# Patient Record
Sex: Female | Born: 1935 | Race: White | Hispanic: No | Smoking: Current every day smoker
Health system: Southern US, Community
[De-identification: ages and names within clinical notes are randomized; demographics above are authoritative.]

## PROBLEM LIST (undated history)

## (undated) DIAGNOSIS — I5189 Other ill-defined heart diseases: Secondary | ICD-10-CM

## (undated) DIAGNOSIS — J189 Pneumonia, unspecified organism: Secondary | ICD-10-CM

## (undated) DIAGNOSIS — I739 Peripheral vascular disease, unspecified: Secondary | ICD-10-CM

## (undated) DIAGNOSIS — I509 Heart failure, unspecified: Secondary | ICD-10-CM

## (undated) DIAGNOSIS — E7800 Pure hypercholesterolemia, unspecified: Secondary | ICD-10-CM

## (undated) DIAGNOSIS — I2510 Atherosclerotic heart disease of native coronary artery without angina pectoris: Secondary | ICD-10-CM

## (undated) DIAGNOSIS — J449 Chronic obstructive pulmonary disease, unspecified: Secondary | ICD-10-CM

## (undated) DIAGNOSIS — R0600 Dyspnea, unspecified: Secondary | ICD-10-CM

## (undated) DIAGNOSIS — I219 Acute myocardial infarction, unspecified: Secondary | ICD-10-CM

## (undated) DIAGNOSIS — I10 Essential (primary) hypertension: Secondary | ICD-10-CM

## (undated) HISTORY — PX: APPENDECTOMY: SHX54

## (undated) HISTORY — PX: TONSILLECTOMY: SUR1361

## (undated) HISTORY — PX: CORONARY ARTERY BYPASS GRAFT: SHX141

## (undated) HISTORY — PX: CHOLECYSTECTOMY: SHX55

## (undated) HISTORY — PX: ABDOMINAL HYSTERECTOMY: SHX81

## (undated) HISTORY — PX: OTHER SURGICAL HISTORY: SHX169

---

## 2001-01-22 ENCOUNTER — Inpatient Hospital Stay (HOSPITAL_COMMUNITY): Admission: RE | Admit: 2001-01-22 | Discharge: 2001-01-27 | Payer: Self-pay | Admitting: Cardiology

## 2001-01-22 ENCOUNTER — Encounter: Payer: Self-pay | Admitting: Emergency Medicine

## 2001-01-30 ENCOUNTER — Emergency Department (HOSPITAL_COMMUNITY): Admission: EM | Admit: 2001-01-30 | Discharge: 2001-01-31 | Payer: Self-pay | Admitting: Emergency Medicine

## 2001-01-31 ENCOUNTER — Encounter: Payer: Self-pay | Admitting: Emergency Medicine

## 2010-08-29 ENCOUNTER — Emergency Department (HOSPITAL_BASED_OUTPATIENT_CLINIC_OR_DEPARTMENT_OTHER)
Admission: EM | Admit: 2010-08-29 | Discharge: 2010-08-29 | Disposition: A | Discharge status: Home / Self Care | Payer: Medicare Other | Attending: Emergency Medicine | Admitting: Emergency Medicine

## 2010-08-29 ENCOUNTER — Encounter: Payer: Self-pay | Admitting: *Deleted

## 2010-08-29 ENCOUNTER — Other Ambulatory Visit: Payer: Self-pay

## 2010-08-29 ENCOUNTER — Emergency Department (INDEPENDENT_AMBULATORY_CARE_PROVIDER_SITE_OTHER): Payer: Medicare Other

## 2010-08-29 DIAGNOSIS — E7800 Pure hypercholesterolemia, unspecified: Secondary | ICD-10-CM | POA: Insufficient documentation

## 2010-08-29 DIAGNOSIS — J40 Bronchitis, not specified as acute or chronic: Secondary | ICD-10-CM

## 2010-08-29 DIAGNOSIS — R509 Fever, unspecified: Secondary | ICD-10-CM | POA: Insufficient documentation

## 2010-08-29 DIAGNOSIS — Z79899 Other long term (current) drug therapy: Secondary | ICD-10-CM | POA: Insufficient documentation

## 2010-08-29 DIAGNOSIS — J3489 Other specified disorders of nose and nasal sinuses: Secondary | ICD-10-CM | POA: Insufficient documentation

## 2010-08-29 DIAGNOSIS — J4489 Other specified chronic obstructive pulmonary disease: Secondary | ICD-10-CM | POA: Insufficient documentation

## 2010-08-29 DIAGNOSIS — R0789 Other chest pain: Secondary | ICD-10-CM | POA: Insufficient documentation

## 2010-08-29 DIAGNOSIS — R0602 Shortness of breath: Secondary | ICD-10-CM

## 2010-08-29 DIAGNOSIS — R059 Cough, unspecified: Secondary | ICD-10-CM | POA: Insufficient documentation

## 2010-08-29 DIAGNOSIS — J449 Chronic obstructive pulmonary disease, unspecified: Secondary | ICD-10-CM | POA: Insufficient documentation

## 2010-08-29 DIAGNOSIS — R05 Cough: Secondary | ICD-10-CM | POA: Insufficient documentation

## 2010-08-29 DIAGNOSIS — I10 Essential (primary) hypertension: Secondary | ICD-10-CM | POA: Insufficient documentation

## 2010-08-29 DIAGNOSIS — Z951 Presence of aortocoronary bypass graft: Secondary | ICD-10-CM | POA: Insufficient documentation

## 2010-08-29 HISTORY — DX: Essential (primary) hypertension: I10

## 2010-08-29 HISTORY — DX: Pure hypercholesterolemia, unspecified: E78.00

## 2010-08-29 LAB — CBC
Hemoglobin: 14.4 g/dL (ref 12.0–15.0)
MCH: 32.7 pg (ref 26.0–34.0)
MCV: 97.7 fL (ref 78.0–100.0)
Platelets: 160 10*3/uL (ref 150–400)
RBC: 4.41 MIL/uL (ref 3.87–5.11)
WBC: 17.2 10*3/uL — ABNORMAL HIGH (ref 4.0–10.5)

## 2010-08-29 LAB — DIFFERENTIAL
Eosinophils Absolute: 0.1 10*3/uL (ref 0.0–0.7)
Lymphocytes Relative: 8 % — ABNORMAL LOW (ref 12–46)
Lymphs Abs: 1.4 10*3/uL (ref 0.7–4.0)
Monocytes Relative: 5 % (ref 3–12)
Neutrophils Relative %: 85 % — ABNORMAL HIGH (ref 43–77)

## 2010-08-29 LAB — BASIC METABOLIC PANEL
BUN: 22 mg/dL (ref 6–23)
CO2: 23 mEq/L (ref 19–32)
GFR calc non Af Amer: >60 mL/min (ref >60)
Glucose, Bld: 134 mg/dL — ABNORMAL HIGH (ref 70–99)
Potassium: 4.2 mEq/L (ref 3.5–5.1)
Sodium: 139 mEq/L (ref 135–145)

## 2010-08-29 LAB — CARDIAC PANEL(CRET KIN+CKTOT+MB+TROPI)
CK, MB: 2 ng/mL (ref 0.3–4.0)
Relative Index: INVALID (ref 0.0–2.5)
Troponin I: <0.3 ng/mL (ref <0.30)

## 2010-08-29 MED ORDER — SODIUM CHLORIDE 0.9 % IV SOLN
Freq: Once | INTRAVENOUS | Status: AC
  Administered 2010-08-29: 12:00:00 via INTRAVENOUS

## 2010-08-29 MED ORDER — ALBUTEROL SULFATE HFA 108 (90 BASE) MCG/ACT IN AERS
2.0000 | INHALATION_SPRAY | RESPIRATORY_TRACT | Status: DC
  Administered 2010-08-29: 13:00:00 via RESPIRATORY_TRACT

## 2010-08-29 MED ORDER — MOXIFLOXACIN HCL IN NACL 400 MG/250ML IV SOLN
400.0000 mg | Freq: Once | INTRAVENOUS | Status: AC
  Administered 2010-08-29: 400 mg via INTRAVENOUS
  Filled 2010-08-29: qty 250

## 2010-08-29 MED ORDER — DIPHENHYDRAMINE HCL 50 MG/ML IJ SOLN
INTRAMUSCULAR | Status: AC
  Administered 2010-08-29: 25 mg
  Filled 2010-08-29: qty 1

## 2010-08-29 MED ORDER — AZITHROMYCIN 250 MG PO TABS
ORAL_TABLET | ORAL | Status: DC
Start: 2010-08-29 — End: 2010-08-29
  Filled 2010-08-29: qty 2

## 2010-08-29 MED ORDER — ALBUTEROL SULFATE HFA 108 (90 BASE) MCG/ACT IN AERS
INHALATION_SPRAY | RESPIRATORY_TRACT | Status: AC
  Filled 2010-08-29: qty 6.7

## 2010-08-29 MED ORDER — AZITHROMYCIN 250 MG PO TABS
500.0000 mg | ORAL_TABLET | Freq: Every day | ORAL | Status: DC
  Administered 2010-08-29: 500 mg via ORAL

## 2010-08-29 MED ORDER — ALBUTEROL SULFATE (2.5 MG/3ML) 0.083% IN NEBU
5.0000 mg | INHALATION_SOLUTION | Freq: Once | RESPIRATORY_TRACT | Status: AC
  Administered 2010-08-29: 5 mg via RESPIRATORY_TRACT
  Filled 2010-08-29: qty 6

## 2010-08-29 MED ORDER — DIPHENHYDRAMINE HCL 25 MG PO CAPS
25.0000 mg | ORAL_CAPSULE | Freq: Once | ORAL | Status: DC
  Filled 2010-08-29: qty 1

## 2010-08-29 MED ORDER — MOXIFLOXACIN HCL 400 MG PO TABS: 400.0000 mg | ORAL_TABLET | Freq: Every day | ORAL | Status: DC

## 2010-08-29 MED ORDER — AZITHROMYCIN 250 MG PO TABS: 250.0000 mg | ORAL_TABLET | Freq: Every day | ORAL | Status: AC

## 2010-08-29 NOTE — ED Provider Notes (Addendum)
History     Chief Complaint  Patient presents with  . Chest Pain   Patient is a 76 y.o. female presenting with URI.  URI The primary symptoms include fever, cough and wheezing. Primary symptoms do not include headaches, sore throat, abdominal pain, nausea, myalgias or rash. The current episode started today. This is a new problem. The problem has been gradually worsening.  The fever began today. The fever has been unchanged since its onset. The maximum temperature recorded prior to her arrival was unknown.  The cough began today. The cough is new. The cough is non-productive and hacking.  Wheezing occurs rarely. The wheezing has been unchanged since its onset. The wheezing had no precipitant. The patient's medical history is significant for COPD and chronic lung disease.  Symptoms associated with the illness include chills and congestion. The illness is not associated with sinus pressure or rhinorrhea. Risk factors for severe complications from URI include being elderly and chronic respiratory disease.  Pt states woke up this am with above sx and also notes SOB with walking   Past Medical History  Diagnosis Date  . Hypertension   . Hypercholesterolemia     Past Surgical History  Procedure Date  . Coronary artery bypass graft     No family history on file.  History  Substance Use Topics  . Smoking status: Current Everyday Smoker -- 1.0 packs/day  . Smokeless tobacco: Not on file  . Alcohol Use: No    OB History    Grav Para Term Preterm Abortions TAB SAB Ect Mult Living                  Review of Systems  Constitutional: Positive for fever and chills.  HENT: Positive for congestion. Negative for sore throat, rhinorrhea and sinus pressure.   Respiratory: Positive for cough, chest tightness and wheezing.        Dyspnea on exertion  Cardiovascular: Negative for palpitations and leg swelling.  Gastrointestinal: Negative for nausea and abdominal pain.  Musculoskeletal:  Negative for myalgias.  Skin: Negative for rash.  Neurological: Negative for headaches.  All other systems reviewed and are negative.    Physical Exam  BP 105/68  Pulse 67  Temp(Src) 98.4 F (36.9 C) (Oral)  Resp 13  SpO2 98%  Physical Exam  Nursing note and vitals reviewed. Constitutional: She is oriented to person, place, and time. She appears well-developed and well-nourished. No distress.  HENT:  Head: Normocephalic and atraumatic.  Eyes: EOM are normal. Pupils are equal, round, and reactive to light.  Cardiovascular: Normal rate, regular rhythm, normal heart sounds and intact distal pulses.  Exam reveals no friction rub.   No murmur heard. Pulmonary/Chest: Effort normal and breath sounds normal. No respiratory distress. She has no wheezes. She has no rales. She exhibits no tenderness.       Mild rhonchi in the lower lobes that clears with coughing  Abdominal: Soft. Bowel sounds are normal. She exhibits no distension. There is no tenderness. There is no rebound and no guarding.  Musculoskeletal: Normal range of motion. She exhibits no tenderness.       No edema.  Healed surgical scar on the left leg.  Good pulses peripherally  Neurological: She is alert and oriented to person, place, and time. No cranial nerve deficit.  Skin: Skin is warm and dry. No rash noted.  Psychiatric: She has a normal mood and affect. Her behavior is normal.    ED Course  Procedures  Date: 08/29/2010  Rate: 77  Rhythm: normal sinus rhythm  QRS Axis: normal  Intervals: normal  ST/T Wave abnormalities: nonspecific T wave changes  Conduction Disutrbances:none  Narrative Interpretation:   Old EKG Reviewed: unchanged    MDM Pt presenting after starting to have fever and chills last night dry cough and SOB with exertion and wheezing.  Pt not febrile here but wet cough and mild rhonchi on exam that clear with coughing.  Normal O2 sats and VS here.  Pt with hx of cardiac disease however denies  any chest pain today and states just feels tight and congested since this am.  States does not feel anything like prior MI and feels like when she had PNA.  Pt is smoker and uses 2 chronic inhalers.  CBC with leukocytosis of 17,000 and BMP, CE wnl.  EKG unchanged from prior and CXR without any focal consolidations.  However concern for early infection given sx and will give avelox and rescue albuterol inhaler. Will have f/u with PCP tomorrow or Monday or return for worsening SOB, fever or other concerns.  Pt and family voice understanding. Initially was going to give avelox however started to react to IV avelox and concern for allergic reaction so will give azithro instead.   Dg Chest 2 View  08/29/2010  *RADIOLOGY REPORT*  Clinical Data: Shortness of breath.  CHEST - 2 VIEW  Comparison: None  Findings: The cardiac silhouette, mediastinal and hilar contours are within normal limits.  There are surgical changes from bypass surgery.  Scarring changes or atelectasis are noted but no infiltrates, edema or effusions.  IMPRESSION: No acute cardiopulmonary findings.  Original Report Authenticated By: P. Loralie Champagne, M.D.   Labs Reviewed  CBC - Abnormal; Notable for the following:    WBC 17.2 (*)    All other components within normal limits  DIFFERENTIAL - Abnormal; Notable for the following:    Neutrophils Relative 85 (*)    Neutro Abs 14.6 (*)    Lymphocytes Relative 8 (*)    All other components within normal limits  BASIC METABOLIC PANEL - Abnormal; Notable for the following:    Glucose, Bld 134 (*)    All other components within normal limits  CARDIAC PANEL(CRET KIN+CKTOT+MB+TROPI)     Gwyneth Sprout, MD 08/29/10 1552  Gwyneth Sprout, MD 08/29/10 1552

## 2010-08-29 NOTE — ED Notes (Signed)
VSS. Pt has no symptoms of rash or SHOB.  Pt ready for d/c.

## 2010-08-29 NOTE — ED Notes (Signed)
I took patient to triage, she refused wheel chair. Patient insisted chest discomfort was flu-like symptom and not chest pain. I took vitals, nurse completed triage. I helped patient into gown, placed patient on wall monitor, and ran ecg. I gave copy to Dr. Nurse placed patient on 2 litres by nassal cannula which brought 02 % from 93 to to 95. Patient stated she smokes, had previous heart problems and past diagnoses of COPD.

## 2010-08-29 NOTE — ED Notes (Signed)
Pt reports onset of chest pressure to left side of chest this am. HX of same. Reports diaphoretic, light headed, and short of breath. Denies feeling at this time.

## 2010-08-29 NOTE — ED Notes (Signed)
Avelox previously charted as started but actual roller was clamped.  Infusion restarted and approximately 10 minutes after started pt developed generalized itching.  No hives or SHOB noted.  Dr. Anitra Lauth informed and at bedside.  New orders given.  Avelox stopped and IV site flushed.

## 2010-08-29 NOTE — ED Notes (Signed)
Hot Tea provided.

## 2010-08-29 NOTE — ED Notes (Signed)
Pt receiving neb tx at present time.

## 2012-01-02 ENCOUNTER — Encounter (HOSPITAL_BASED_OUTPATIENT_CLINIC_OR_DEPARTMENT_OTHER): Payer: Self-pay | Admitting: Student

## 2012-01-02 ENCOUNTER — Emergency Department (HOSPITAL_BASED_OUTPATIENT_CLINIC_OR_DEPARTMENT_OTHER)
Admission: EM | Admit: 2012-01-02 | Discharge: 2012-01-02 | Disposition: A | Discharge status: Home / Self Care | Payer: Medicare Other | Attending: Emergency Medicine | Admitting: Emergency Medicine

## 2012-01-02 ENCOUNTER — Emergency Department (HOSPITAL_BASED_OUTPATIENT_CLINIC_OR_DEPARTMENT_OTHER): Payer: Medicare Other

## 2012-01-02 DIAGNOSIS — S92309A Fracture of unspecified metatarsal bone(s), unspecified foot, initial encounter for closed fracture: Secondary | ICD-10-CM | POA: Insufficient documentation

## 2012-01-02 DIAGNOSIS — J4489 Other specified chronic obstructive pulmonary disease: Secondary | ICD-10-CM | POA: Insufficient documentation

## 2012-01-02 DIAGNOSIS — Z79899 Other long term (current) drug therapy: Secondary | ICD-10-CM | POA: Insufficient documentation

## 2012-01-02 DIAGNOSIS — J449 Chronic obstructive pulmonary disease, unspecified: Secondary | ICD-10-CM | POA: Insufficient documentation

## 2012-01-02 DIAGNOSIS — F17200 Nicotine dependence, unspecified, uncomplicated: Secondary | ICD-10-CM | POA: Insufficient documentation

## 2012-01-02 DIAGNOSIS — Z7982 Long term (current) use of aspirin: Secondary | ICD-10-CM | POA: Insufficient documentation

## 2012-01-02 DIAGNOSIS — Y9389 Activity, other specified: Secondary | ICD-10-CM | POA: Insufficient documentation

## 2012-01-02 DIAGNOSIS — S92353A Displaced fracture of fifth metatarsal bone, unspecified foot, initial encounter for closed fracture: Secondary | ICD-10-CM

## 2012-01-02 DIAGNOSIS — I2510 Atherosclerotic heart disease of native coronary artery without angina pectoris: Secondary | ICD-10-CM | POA: Insufficient documentation

## 2012-01-02 DIAGNOSIS — I10 Essential (primary) hypertension: Secondary | ICD-10-CM | POA: Insufficient documentation

## 2012-01-02 DIAGNOSIS — W1789XA Other fall from one level to another, initial encounter: Secondary | ICD-10-CM | POA: Insufficient documentation

## 2012-01-02 DIAGNOSIS — E7800 Pure hypercholesterolemia, unspecified: Secondary | ICD-10-CM | POA: Insufficient documentation

## 2012-01-02 DIAGNOSIS — Y92009 Unspecified place in unspecified non-institutional (private) residence as the place of occurrence of the external cause: Secondary | ICD-10-CM | POA: Insufficient documentation

## 2012-01-02 HISTORY — DX: Atherosclerotic heart disease of native coronary artery without angina pectoris: I25.10

## 2012-01-02 HISTORY — DX: Chronic obstructive pulmonary disease, unspecified: J44.9

## 2012-01-02 NOTE — ED Notes (Signed)
Pt in with c/o left foot pain s/p twisting injury while ambulating from a seat position yesterday. No bruising or swelling noted. Pt took 1000 mg tylenol at 0830 this morning. Pt able to bear weight

## 2012-01-02 NOTE — Discharge Instructions (Signed)
Foot Fracture  Your caregiver has diagnosed you as having a foot fracture (broken bone). Your foot has many bones. You have a fracture, or break, in one of these bones. In some cases, your doctor may put on a splint or removable fracture boot until the swelling in your foot has lessened. A cast may or may not be required.  HOME CARE INSTRUCTIONS   If you do not have a cast or splint:   You may bear weight on your injured foot as tolerated or advised.   Do not put any weight on your injured foot for as long as directed by your caregiver. Slowly increase the amount of time you walk on the foot as the pain and swelling allows or as advised.   Use crutches until you can bear weight without pain. A gradual increase in weight bearing may help.   Apply ice to the injury for 15 to 20 minutes each hour while awake for the first 2 days. Put the ice in a plastic bag and place a towel between the bag of ice and your skin.   If an ace bandage (stretchy, elastic wrapping bandage) was applied, you may re-wrap it if ankle is more painful or your toes become cold and swollen.  If you have a cast or splint:   Use your crutches for as long as directed by your caregiver.   To lessen the swelling, keep the injured foot elevated on pillows while lying down or sitting. Elevate your foot above your heart.   Apply ice to the injury for 15 to 20 minutes each hour while awake for the first 2 days. Put the ice in a plastic bag and place a thin towel between the bag of ice and your cast.   Plaster or fiberglass cast:   Do not try to scratch the skin under the cast using a sharp or pointed object down the cast.   Check the skin around the cast every day. You may put lotion on any red or sore areas.   Keep your cast clean and dry.   Plaster splint:   Wear the splint until you are seen for a follow-up examination.   You may loosen the elastic around the splint if your toes become numb, tingle, or turn blue or cold. Do not rest it on  anything harder than a pillow in the first 24 hours.   Do not put pressure on any part of your splint. Use your crutches as directed.   Keep your splint dry. It can be protected during bathing with a plastic bag. Do not lower the splint into water.   If you have a fracture boot you may remove it to shower. Bear weight only as instructed by your caregiver.   Only take over-the-counter or prescription medicines for pain, discomfort, or fever as directed by your caregiver.  SEEK IMMEDIATE MEDICAL CARE IF:    Your cast gets damaged or breaks.   You have continued severe pain or more swelling than you did before the cast was put on.   Your skin or nails of your casted foot turn blue, gray, feel cold or numb.   There is a bad smell from your cast.   There is severe pain with movement of your toes.   There are new stains and/or drainage coming from under the cast.  MAKE SURE YOU:    Understand these instructions.   Will watch your condition.   Will get help right away if 

## 2012-01-02 NOTE — ED Provider Notes (Signed)
History     CSN: [id number]  Arrival date & time 01/02/12  5784   First MD Initiated Contact with Patient 01/02/12 [phone]      Chief Complaint  Patient presents with  . Foot Pain    left foot and ankle    (Consider location/radiation/quality/duration/timing/severity/associated sxs/prior treatment) HPI Comments: Patient presents with left foot pain.  She was asleep on the couch last night and her foot fell asleep.  When she got up, her leg gave out and she fell.    Patient is a 77 y.o. female presenting with lower extremity pain. The history is provided by the patient.  Foot Pain This is a new problem. Episode onset: last night. The problem occurs constantly. The problem has not changed since onset.The symptoms are aggravated by walking (bearing weight). Nothing relieves the symptoms. She has tried acetaminophen for the symptoms. The treatment provided mild relief.    Past Medical History  Diagnosis Date  . Hypertension   . Hypercholesterolemia   . CAD (coronary artery disease)   . COPD (chronic obstructive pulmonary disease)     Past Surgical History  Procedure Date  . Coronary artery bypass graft   . Abdominal hysterectomy   . Tonsillectomy   . Cholecystectomy   . Appendectomy     History reviewed. No pertinent family history.  History  Substance Use Topics  . Smoking status: Current Every Day Smoker -- 1.0 packs/day  . Smokeless tobacco: Not on file  . Alcohol Use: No    OB History    Grav Para Term Preterm Abortions TAB SAB Ect Mult Living                  Review of Systems  All other systems reviewed and are negative.    Allergies  Penicillins  Home Medications   Current Outpatient Rx  Name  Route  Sig  Dispense  Refill  . ASPIRIN 325 MG PO TABS   Oral   Take 325 mg by mouth daily.           . ATORVASTATIN CALCIUM 80 MG PO TABS   Oral   Take 80 mg by mouth daily.           Marland Kitchen CLOPIDOGREL BISULFATE 75 MG PO TABS   Oral   Take 75 mg by  mouth daily.           Marland Kitchen FAMOTIDINE 20 MG PO TABS   Oral   Take 20 mg by mouth 2 (two) times daily.           Marland Kitchen FLUTICASONE-SALMETEROL 115-21 MCG/ACT IN AERO   Inhalation   Inhale 2 puffs into the lungs 2 (two) times daily.           Marland Kitchen METOPROLOL SUCCINATE ER 100 MG PO TB24   Oral   Take 100 mg by mouth daily.           Marland Kitchen TIOTROPIUM BROMIDE MONOHYDRATE 18 MCG IN CAPS   Inhalation   Place 18 mcg into inhaler and inhale daily.             BP 139/51  Pulse 59  Temp 98 F (36.7 C) (Oral)  Resp 18  Ht 5\' 1"  (1.549 m)  Wt 174 lb (78.926 kg)  BMI 32.88 kg/m2  SpO2 92%  Physical Exam  Nursing note and vitals reviewed. Constitutional: She is oriented to person, place, and time. She appears well-developed and well-nourished.  HENT:  Head: Normocephalic and atraumatic.  Neck: Normal range of motion. Neck supple.  Musculoskeletal:       The left foot is noted to have pain, ttp, and mild swelling over the distal 5th metatarsal.  There is no obvious deformity.    Neurological: She is alert and oriented to person, place, and time.  Skin: Skin is warm and dry.    ED Course  Procedures (including critical care time)  Labs Reviewed - No data to display No results found.   No diagnosis found.    MDM  Xrays reveal a fracture of the 5th metatarsal.  She will be placed in a post op shoe and follow up with Orthopedics as she has a history of poor circulation to the legs.        Geoffery Lyons, MD 01/02/12 1110

## 2012-07-09 ENCOUNTER — Emergency Department (HOSPITAL_COMMUNITY): Payer: Medicare Other

## 2012-07-09 ENCOUNTER — Encounter (HOSPITAL_COMMUNITY): Payer: Self-pay | Admitting: Emergency Medicine

## 2012-07-09 ENCOUNTER — Inpatient Hospital Stay (HOSPITAL_COMMUNITY)
Admission: EM | Admit: 2012-07-09 | Discharge: 2012-07-11 | DRG: 293 | Disposition: A | Discharge status: Home / Self Care | Payer: Medicare Other | Attending: Internal Medicine | Admitting: Internal Medicine

## 2012-07-09 DIAGNOSIS — I5033 Acute on chronic diastolic (congestive) heart failure: Secondary | ICD-10-CM | POA: Diagnosis present

## 2012-07-09 DIAGNOSIS — I10 Essential (primary) hypertension: Secondary | ICD-10-CM

## 2012-07-09 DIAGNOSIS — J90 Pleural effusion, not elsewhere classified: Secondary | ICD-10-CM

## 2012-07-09 DIAGNOSIS — F17200 Nicotine dependence, unspecified, uncomplicated: Secondary | ICD-10-CM | POA: Diagnosis present

## 2012-07-09 DIAGNOSIS — Z7902 Long term (current) use of antithrombotics/antiplatelets: Secondary | ICD-10-CM

## 2012-07-09 DIAGNOSIS — Z880 Allergy status to penicillin: Secondary | ICD-10-CM

## 2012-07-09 DIAGNOSIS — I509 Heart failure, unspecified: Secondary | ICD-10-CM | POA: Diagnosis present

## 2012-07-09 DIAGNOSIS — Z951 Presence of aortocoronary bypass graft: Secondary | ICD-10-CM

## 2012-07-09 DIAGNOSIS — I2789 Other specified pulmonary heart diseases: Secondary | ICD-10-CM | POA: Diagnosis present

## 2012-07-09 DIAGNOSIS — J449 Chronic obstructive pulmonary disease, unspecified: Secondary | ICD-10-CM | POA: Diagnosis present

## 2012-07-09 DIAGNOSIS — I2510 Atherosclerotic heart disease of native coronary artery without angina pectoris: Secondary | ICD-10-CM | POA: Diagnosis present

## 2012-07-09 DIAGNOSIS — E785 Hyperlipidemia, unspecified: Secondary | ICD-10-CM | POA: Diagnosis present

## 2012-07-09 DIAGNOSIS — E7800 Pure hypercholesterolemia, unspecified: Secondary | ICD-10-CM | POA: Diagnosis present

## 2012-07-09 DIAGNOSIS — D509 Iron deficiency anemia, unspecified: Secondary | ICD-10-CM | POA: Diagnosis present

## 2012-07-09 DIAGNOSIS — J4489 Other specified chronic obstructive pulmonary disease: Secondary | ICD-10-CM | POA: Diagnosis present

## 2012-07-09 DIAGNOSIS — I059 Rheumatic mitral valve disease, unspecified: Secondary | ICD-10-CM

## 2012-07-09 DIAGNOSIS — Z79899 Other long term (current) drug therapy: Secondary | ICD-10-CM

## 2012-07-09 DIAGNOSIS — I5043 Acute on chronic combined systolic (congestive) and diastolic (congestive) heart failure: Principal | ICD-10-CM | POA: Diagnosis present

## 2012-07-09 DIAGNOSIS — I739 Peripheral vascular disease, unspecified: Secondary | ICD-10-CM | POA: Diagnosis present

## 2012-07-09 HISTORY — DX: Other ill-defined heart diseases: I51.89

## 2012-07-09 HISTORY — DX: Acute myocardial infarction, unspecified: I21.9

## 2012-07-09 HISTORY — DX: Pneumonia, unspecified organism: J18.9

## 2012-07-09 HISTORY — DX: Peripheral vascular disease, unspecified: I73.9

## 2012-07-09 LAB — POCT I-STAT TROPONIN I: Troponin i, poc: 0.05 ng/mL (ref 0.00–0.08)

## 2012-07-09 LAB — CBC
Hemoglobin: 9.8 g/dL — ABNORMAL LOW (ref 12.0–15.0)
Platelets: 269 10*3/uL (ref 150–400)
RBC: 3.48 MIL/uL — ABNORMAL LOW (ref 3.87–5.11)
WBC: 8.4 10*3/uL (ref 4.0–10.5)

## 2012-07-09 LAB — COMPREHENSIVE METABOLIC PANEL
ALT: 17 U/L (ref 0–35)
AST: 21 U/L (ref 0–37)
Albumin: 3.4 g/dL — ABNORMAL LOW (ref 3.5–5.2)
Calcium: 8.9 mg/dL (ref 8.4–10.5)
Creatinine, Ser: 0.68 mg/dL (ref 0.50–1.10)
Sodium: 140 mEq/L (ref 135–145)
Total Protein: 6.1 g/dL (ref 6.0–8.3)

## 2012-07-09 LAB — URINALYSIS, ROUTINE W REFLEX MICROSCOPIC
Hgb urine dipstick: NEGATIVE
Nitrite: NEGATIVE
Protein, ur: NEGATIVE mg/dL
Specific Gravity, Urine: 1.009 (ref 1.005–1.030)
Urobilinogen, UA: 0.2 mg/dL (ref 0.0–1.0)

## 2012-07-09 LAB — BASIC METABOLIC PANEL
CO2: 23 mEq/L (ref 19–32)
Calcium: 8.8 mg/dL (ref 8.4–10.5)
Chloride: 108 mEq/L (ref 96–112)
Creatinine, Ser: 0.75 mg/dL (ref 0.50–1.10)
Glucose, Bld: 141 mg/dL — ABNORMAL HIGH (ref 70–99)
Potassium: 4 mEq/L (ref 3.5–5.1)
Sodium: 144 mEq/L (ref 135–145)

## 2012-07-09 LAB — PRO B NATRIURETIC PEPTIDE: Pro B Natriuretic peptide (BNP): 4276 pg/mL — ABNORMAL HIGH (ref 0–450)

## 2012-07-09 LAB — TROPONIN I: Troponin I: <0.3 ng/mL (ref <0.30)

## 2012-07-09 LAB — TSH: TSH: 1.04 u[IU]/mL (ref 0.350–4.500)

## 2012-07-09 MED ORDER — TIOTROPIUM BROMIDE MONOHYDRATE 18 MCG IN CAPS
18.0000 ug | ORAL_CAPSULE | Freq: Every day | RESPIRATORY_TRACT | Status: DC
  Administered 2012-07-10 – 2012-07-11 (×2): 18 ug via RESPIRATORY_TRACT
  Filled 2012-07-09: qty 5

## 2012-07-09 MED ORDER — ONDANSETRON HCL 4 MG/2ML IJ SOLN: 4.0000 mg | Freq: Four times a day (QID) | INTRAMUSCULAR | Status: DC | PRN

## 2012-07-09 MED ORDER — POTASSIUM CHLORIDE CRYS ER 20 MEQ PO TBCR
40.0000 meq | EXTENDED_RELEASE_TABLET | Freq: Once | ORAL | Status: AC
  Administered 2012-07-09: 40 meq via ORAL

## 2012-07-09 MED ORDER — FUROSEMIDE 10 MG/ML IJ SOLN
40.0000 mg | Freq: Once | INTRAMUSCULAR | Status: DC
Start: 2012-07-09 — End: 2012-07-09

## 2012-07-09 MED ORDER — ACETAMINOPHEN 325 MG PO TABS: 650.0000 mg | ORAL_TABLET | Freq: Four times a day (QID) | ORAL | Status: DC | PRN

## 2012-07-09 MED ORDER — ONDANSETRON HCL 4 MG PO TABS: 4.0000 mg | ORAL_TABLET | Freq: Four times a day (QID) | ORAL | Status: DC | PRN

## 2012-07-09 MED ORDER — FUROSEMIDE 10 MG/ML IJ SOLN
40.0000 mg | Freq: Two times a day (BID) | INTRAMUSCULAR | Status: DC
  Administered 2012-07-09 – 2012-07-10 (×2): 40 mg via INTRAVENOUS
  Filled 2012-07-09 (×4): qty 4

## 2012-07-09 MED ORDER — ALUM & MAG HYDROXIDE-SIMETH 200-200-20 MG/5ML PO SUSP: 30.0000 mL | Freq: Four times a day (QID) | ORAL | Status: DC | PRN

## 2012-07-09 MED ORDER — ENOXAPARIN SODIUM 40 MG/0.4ML ~~LOC~~ SOLN
40.0000 mg | SUBCUTANEOUS | Status: DC
  Administered 2012-07-09 – 2012-07-10 (×2): 40 mg via SUBCUTANEOUS
  Filled 2012-07-09 (×3): qty 0.4

## 2012-07-09 MED ORDER — ALBUTEROL SULFATE (5 MG/ML) 0.5% IN NEBU: 2.5000 mg | INHALATION_SOLUTION | Freq: Four times a day (QID) | RESPIRATORY_TRACT | Status: DC

## 2012-07-09 MED ORDER — FAMOTIDINE 20 MG PO TABS
20.0000 mg | ORAL_TABLET | Freq: Two times a day (BID) | ORAL | Status: DC
  Administered 2012-07-09 – 2012-07-11 (×5): 20 mg via ORAL
  Filled 2012-07-09 (×6): qty 1

## 2012-07-09 MED ORDER — FUROSEMIDE 10 MG/ML IJ SOLN
40.0000 mg | Freq: Once | INTRAMUSCULAR | Status: AC
  Administered 2012-07-09: 40 mg via INTRAVENOUS
  Filled 2012-07-09: qty 4

## 2012-07-09 MED ORDER — LORAZEPAM 2 MG/ML IJ SOLN
0.5000 mg | Freq: Once | INTRAMUSCULAR | Status: AC
  Administered 2012-07-09: 0.5 mg via INTRAVENOUS
  Filled 2012-07-09: qty 1

## 2012-07-09 MED ORDER — MOMETASONE FURO-FORMOTEROL FUM 100-5 MCG/ACT IN AERO
2.0000 | INHALATION_SPRAY | Freq: Two times a day (BID) | RESPIRATORY_TRACT | Status: DC
  Administered 2012-07-09 – 2012-07-11 (×4): 2 via RESPIRATORY_TRACT
  Filled 2012-07-09: qty 8.8

## 2012-07-09 MED ORDER — TIOTROPIUM BROMIDE MONOHYDRATE 18 MCG IN CAPS
18.0000 ug | ORAL_CAPSULE | Freq: Every day | RESPIRATORY_TRACT | Status: DC
  Filled 2012-07-09: qty 5

## 2012-07-09 MED ORDER — METOPROLOL SUCCINATE ER 100 MG PO TB24
100.0000 mg | ORAL_TABLET | Freq: Every day | ORAL | Status: DC
  Administered 2012-07-09 – 2012-07-10 (×2): 100 mg via ORAL
  Filled 2012-07-09 (×2): qty 1

## 2012-07-09 MED ORDER — SODIUM CHLORIDE 0.9 % IJ SOLN
3.0000 mL | Freq: Two times a day (BID) | INTRAMUSCULAR | Status: DC
  Administered 2012-07-09 – 2012-07-10 (×4): 3 mL via INTRAVENOUS

## 2012-07-09 MED ORDER — ATORVASTATIN CALCIUM 80 MG PO TABS
80.0000 mg | ORAL_TABLET | Freq: Every day | ORAL | Status: DC
  Administered 2012-07-09 – 2012-07-10 (×2): 80 mg via ORAL
  Filled 2012-07-09 (×3): qty 1

## 2012-07-09 MED ORDER — ASPIRIN 325 MG PO TABS
325.0000 mg | ORAL_TABLET | Freq: Every day | ORAL | Status: DC
  Administered 2012-07-09: 325 mg via ORAL
  Filled 2012-07-09 (×2): qty 1

## 2012-07-09 MED ORDER — ACETAMINOPHEN 650 MG RE SUPP: 650.0000 mg | Freq: Four times a day (QID) | RECTAL | Status: DC | PRN

## 2012-07-09 MED ORDER — IPRATROPIUM BROMIDE 0.02 % IN SOLN: 0.5000 mg | Freq: Four times a day (QID) | RESPIRATORY_TRACT | Status: DC

## 2012-07-09 MED ORDER — CLOPIDOGREL BISULFATE 75 MG PO TABS
75.0000 mg | ORAL_TABLET | Freq: Every day | ORAL | Status: DC
  Administered 2012-07-09 – 2012-07-11 (×3): 75 mg via ORAL
  Filled 2012-07-09 (×3): qty 1

## 2012-07-09 MED ORDER — POTASSIUM CHLORIDE CRYS ER 20 MEQ PO TBCR
40.0000 meq | EXTENDED_RELEASE_TABLET | Freq: Every day | ORAL | Status: DC
  Administered 2012-07-10 – 2012-07-11 (×2): 40 meq via ORAL
  Filled 2012-07-09 (×3): qty 2

## 2012-07-09 NOTE — ED Notes (Signed)
MD at bedside. Resp. Therapy and Lab at bedside

## 2012-07-09 NOTE — ED Notes (Signed)
Respiratory & phlebotomy at bedside, pt on 5L [place] maintaining O2 sats in 90s

## 2012-07-09 NOTE — Progress Notes (Signed)
Utilization Review Completed.Dorcas Carrow T5/30/2014

## 2012-07-09 NOTE — ED Notes (Signed)
Called resp therapy to look at patient again

## 2012-07-09 NOTE — ED Notes (Signed)
Pt sats on O2 in the low 80s. Pt placed on [place] at 4L sats at 91%

## 2012-07-09 NOTE — ED Provider Notes (Signed)
I have personally seen and examined the patient.  I have discussed the plan of care with the resident.  I have reviewed the documentation on PMH/FH/Soc. History.  I have reviewed the documentation of the resident and agree.  I have reviewed and agree with the ECG interpretation(s) documented by the resident.   I checked on patient multiple times and seems to be improving with Bipap Will admit patient Patient agreeable with plan  CRITICAL CARE Performed by: Joya Gaskins Total critical care time: 31 Critical care time was exclusive of separately billable procedures and treating other patients. Critical care was necessary to treat or prevent imminent or life-threatening deterioration. Critical care was time spent personally by me on the following activities: development of treatment plan with patient and/or surrogate as well as nursing, discussions with consultants, evaluation of patient's response to treatment, examination of patient, obtaining history from patient or surrogate, ordering and performing treatments and interventions, ordering and review of laboratory studies, ordering and review of radiographic studies, pulse oximetry and re-evaluation of patient's condition.   Joya Gaskins, MD 07/09/12 [phone]

## 2012-07-09 NOTE — ED Notes (Signed)
Pt states she has SOB for several hours. EMS states patient sats on RA 70s. Pt placed on Neb by EMS and patient sats >95%. EMS gave patient 10 of albuterol, 500 of Atrovent, and 125 of Solumedrol. Pt is on 2L [place] at home.

## 2012-07-09 NOTE — ED Provider Notes (Signed)
On recheck, pt appears more comfortable with bipap Due to need for strict I/O and fluid monitoring, will place foley catheter and needs admission to stepdown   Joya Gaskins, MD 07/09/12 [phone]

## 2012-07-09 NOTE — ED Provider Notes (Signed)
History    CSN: [id number] Arrival date & time 07/09/12  0619  None    Chief Complaint  Patient presents with  . Shortness of Breath   HPI Comments: Patient is a 78 year old female who presents with acute worsening shortness of breath as of this morning.  Reports she has had increasing lower extremity swelling and increasing orthopnea over the past one month.  It has significantly worsened overnight however.  She denies sleeping last night and reports he had to sit straight up.  She denies any chest pain, chest pressure, diaphoresis, pain radiating to her neck or jaw.  She does report approximately one week ago she had some midsternal chest pressure that was relieved with burping.  She has no recurrence of this.  She does have known history of coronary artery disease and is status post stenting and CABG.  She denies any history of congestive heart failure.  Her cardiologist is Dr. Heron Nay in [place] - she was last seen approximately one month ago.  She is followed by [facility] in [place] Dr. Riley Nearing.    She denies any nausea vomiting.  She does have lower extremity edema that has progressively worsened over the past month as well she does have chronic venous stasis changes with stasis dermatitis and weeping but no open wounds.  She does not take fluid pills.  She has otherwise been compliant with her medications  Patient is a 78 y.o. female presenting with shortness of breath. The history is provided by the patient.  Shortness of Breath Severity:  Severe Onset quality:  Gradual Duration:  1 day Timing:  Constant Progression:  Worsening Chronicity:  New Context: activity and smoke exposure   Relieved by:  Nothing Worsened by:  Nothing tried Ineffective treatments:  Oxygen, position changes, rest, sitting up and lying down Associated symptoms: PND and sputum production   Associated symptoms: no abdominal pain, no chest pain, no claudication, no cough, no diaphoresis, no  fever, no headaches, no hemoptysis, no syncope, no vomiting and no wheezing   Risk factors: obesity and tobacco use     Past Medical History  Diagnosis Date  . Hypertension   . Hypercholesterolemia   . CAD (coronary artery disease)   . COPD (chronic obstructive pulmonary disease)     Past Surgical History  Procedure Laterality Date  . Coronary artery bypass graft    . Abdominal hysterectomy    . Tonsillectomy    . Cholecystectomy    . Appendectomy      History reviewed. No pertinent family history.  History  Substance Use Topics  . Smoking status: Current Every Day Smoker -- 1.00 packs/day  . Smokeless tobacco: Not on file  . Alcohol Use: No    OB History   Grav Para Term Preterm Abortions TAB SAB Ect Mult Living                  Review of Systems  Unable to perform ROS: Severe respiratory distress  Constitutional: Positive for activity change. Negative for fever and diaphoresis.       Limited review of systems due to respiratory distress. Otherwise per HPI  Respiratory: Positive for sputum production and shortness of breath. Negative for cough, hemoptysis and wheezing.   Cardiovascular: Positive for PND. Negative for chest pain, claudication and syncope.  Gastrointestinal: Negative for vomiting and abdominal pain.  Neurological: Negative for headaches.    Allergies  Penicillins  Home Medications   Current Outpatient Rx  Name  Route  Sig  Dispense  Refill  . aspirin 325 MG tablet   Oral   Take 325 mg by mouth daily.           Marland Kitchen atorvastatin (LIPITOR) 80 MG tablet   Oral   Take 80 mg by mouth daily.           . clopidogrel (PLAVIX) 75 MG tablet   Oral   Take 75 mg by mouth daily.           . famotidine (PEPCID) 20 MG tablet   Oral   Take 20 mg by mouth 2 (two) times daily.           . fluticasone-salmeterol (ADVAIR HFA) 115-21 MCG/ACT inhaler   Inhalation   Inhale 2 puffs into the lungs 2 (two) times daily.           . metoprolol  (TOPROL-XL) 100 MG 24 hr tablet   Oral   Take 100 mg by mouth daily.           Marland Kitchen tiotropium (SPIRIVA) 18 MCG inhalation capsule   Inhalation   Place 18 mcg into inhaler and inhale daily.             BP 133/46  Pulse 89  Temp(Src) 98 F (36.7 C) (Oral)  Resp 26  SpO2 99%  Physical Exam  Nursing note and vitals reviewed. Constitutional: She appears well-developed and well-nourished. She appears distressed.  HENT:  Head: Normocephalic and atraumatic.  Eyes: Conjunctivae are normal. Right eye exhibits no discharge. Left eye exhibits no discharge. No scleral icterus.  Neck: JVD (to earlobe with HJR) present. No tracheal deviation present.  Cardiovascular: Normal rate, regular rhythm, normal heart sounds and intact distal pulses.  Exam reveals no gallop and no friction rub.   No murmur heard. Distant heart sounds  Pulmonary/Chest: She is in respiratory distress (moderate to severe, tripoding and sitting upright,  Tachypenic to 30s without evidence of fatigue). She has wheezes (mild faint). She has rales (coarse bi-basilar). She exhibits no tenderness.  Abdominal: Soft. She exhibits distension. She exhibits no mass. There is no tenderness. There is no rebound and no guarding.  Musculoskeletal: Normal range of motion. She exhibits edema (4+ pitting edema with + sacral edema).  Neurological: She is alert. She exhibits normal muscle tone.  Skin: Skin is warm and dry. Rash (Distal pulses 2+ over 4 with significant edema and weeping stasis dermatitis.  No significant open wounds ) noted. She is not diaphoretic. No erythema. No pallor.  Psychiatric: She has a normal mood and affect. Her behavior is normal. Judgment and thought content normal.    ED Course  Procedures (including critical care time)  Labs Reviewed  CBC - Abnormal; Notable for the following:    RBC 3.48 (*)    Hemoglobin 9.8 (*)    HCT 32.0 (*)    RDW 16.5 (*)    All other components within normal limits   COMPREHENSIVE METABOLIC PANEL - Abnormal; Notable for the following:    Glucose, Bld 133 (*)    Albumin 3.4 (*)    GFR calc non Af Amer 83 (*)    All other components within normal limits  PRO B NATRIURETIC PEPTIDE - Abnormal; Notable for the following:    Pro B Natriuretic peptide (BNP) 4276.0 (*)    All other components within normal limits  URINE CULTURE  TROPONIN I  URINALYSIS, ROUTINE W REFLEX MICROSCOPIC  POCT I-STAT TROPONIN I   Dg Chest  2 View (if Patient Has Fever And/or Copd)  07/09/2012   *RADIOLOGY REPORT*  Clinical Data: Shortness of breath  CHEST - 2 VIEW  Comparison: 08/29/2010  Findings: Prior coronary bypass changes.  Heart is enlarged with symmetric diffuse interstitial opacities and small effusions compatible with interstitial edema.  Findings consistent with CHF. Atherosclerosis of the aorta.  Trachea is midline.  No pneumothorax.  IMPRESSION: CHF.   Original Report Authenticated By: Judie Petit. Miles Costain, M.D.      Date: 07/09/2012  Rate: 86  Rhythm: normal sinus rhythm  QRS Axis: normal  Intervals: normal  ST/T Wave abnormalities: Nonspecific T wave flattening  Conduction Disutrbances: none  Narrative Interpretation: Nonspecific T wave flattening, LVH  Old EKG Reviewed: No significant changes noted   1. Acute CHF      MDM  Patient in moderate to severe respiratory distress with noted hypoxia.  Patient does have a history of COPD but is floridly volume overloaded.  Patient is satting well on 5 L of nasal oxygen but still is tachypnic.  We'll start initially with Lasix and continue to monitor.  If any further desaturations on oxygen will place on BiPAP.  Denies any chest pain at this time, no EKG changes.  X-ray & Labs are pending.  Xray with significant volume overload.  Will place pt on BiPap as now self-peeping on exam and worsening conversational dyspnea with conversational hypoxia on 5L to mid 80s.    Patient is resting comfortably.  She has BiPAP in place and  sleeping well but awakens easily.  Mentation is clear.  It is noted the patient has a widened pulse pressure with a systolic blood pressure in the 130s and diastolic of high 40J to 40s.  There is no systolic ejection murmur noted.  She will need an echo to eval for Aortic Stenosis.  Cautious but aggressive diuresis  Called and discussed with Dr. Dorthula Rue with internal medicine teaching service for unassigned admission.  They will admit her to step down.   Andrena Mews, DO 07/09/12 [phone]

## 2012-07-09 NOTE — H&P (Signed)
Hospital Admission Note Date: 07/09/2012  Patient name: Belinda Werner Medical record number: [id number] Date of birth: Nov 16, 1934 Age: 78 y.o. Gender: female PCP: Provider Not In System  Medical Service: Internal Medicine  Attending physician: Dr. Rogelia Boga    1st Contact: Shirlee Latch     Pager: [phone] 2nd Contact: Dierdre Searles    Pager:[phone] After 5 pm or weekends: 1st Contact:      Pager: [phone] 2nd Contact:      Pager: [phone]  Chief Complaint: SOB  History of Present Illness: A 78-year-old woman with past medical history significant for coronary artery disease status post CABG ~ 7 yrs ago , COPD on 2 L of home oxygen presents to the ED with shortness of breath.  Patient was on BiPAP during my evaluation and most of the history was obtained, with her writing on a piece of paper.  Patient reports that she has been noticing gradually worsening swelling of her legs for last 1 month. She also reports that she has been having orthopnea for last few weeks. She started developing acute shortness of breath last evening that continued to get worse overnight that prompted her to come to the ED. She denies any chest pain, palpitations, fever, chills, nausea or vomiting. 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  Her cardiologist is Dr. Bary Castilla at [facility].   Meds: No current facility-administered medications for this encounter.   Current Outpatient Prescriptions  Medication Sig Dispense Refill  . aspirin 325 MG tablet Take 325 mg by mouth daily.        Marland Kitchen atorvastatin (LIPITOR) 80 MG tablet Take 80 mg by mouth daily.        . clopidogrel (PLAVIX) 75 MG tablet Take 75 mg by mouth daily.        . famotidine (PEPCID) 20 MG tablet Take 20 mg by mouth 2 (two) times daily.        . fluticasone-salmeterol (ADVAIR HFA) 115-21 MCG/ACT inhaler Inhale 2 puffs into the lungs 2 (two) times daily.        . metoprolol (TOPROL-XL) 100 MG 24 hr tablet Take 100 mg by mouth daily.        Marland Kitchen  tiotropium (SPIRIVA) 18 MCG inhalation capsule Place 18 mcg into inhaler and inhale daily.          Allergies: Allergies as of 07/09/2012 - Review Complete 07/09/2012  Allergen Reaction Noted  . Penicillins Hives 08/29/2010   Past Medical History  Diagnosis Date  . Hypertension   . Hypercholesterolemia   . CAD (coronary artery disease)   . COPD (chronic obstructive pulmonary disease)    Past Surgical History  Procedure Laterality Date  . Coronary artery bypass graft    . Abdominal hysterectomy    . Tonsillectomy    . Cholecystectomy    . Appendectomy     History reviewed. No pertinent family history. History   Social History  . Marital Status: Divorced    Spouse Name: N/A    Number of Children: N/A  . Years of Education: N/A   Occupational History  . Not on file.   Social History Main Topics  . Smoking status: Current Every Day Smoker -- 1.00 packs/day  . Smokeless tobacco: Not on file  . Alcohol Use: No  . Drug Use: No  . Sexually Active: No   Other Topics Concern  . Not on file   Social History Narrative  . No narrative on file    Review of  Systems: Constitutional: negative for anorexia, chills, fatigue, fevers and sweats Ears, nose, mouth, throat, and face: negative for ear drainage, earaches, hoarseness, nasal congestion, sore throat, tinnitus and voice change Respiratory: negative for cough, emphysema, sputum, stridor and wheezing Cardiovascular: negative for chest pain, chest pressure/discomfort, dyspnea,  palpitations and syncope, positive for lower extremity edema and orthopnea Gastrointestinal: negative for abdominal pain, change in bowel habits, nausea, reflux symptoms and vomiting Musculoskeletal:negative for arthralgias, bone pain, myalgias and neck pain Neurological: negative for coordination problems, dizziness, headaches, paresthesia, seizures, speech problems and vertigo  Physical Exam: Blood pressure 139/46, pulse 87, temperature 98 F (36.7  C), temperature source Oral, resp. rate 24, SpO2 95.00%. BP 139/46  Pulse 87  Temp(Src) 98 F (36.7 C) (Oral)  Resp 24  SpO2 95%  General Appearance:    Alert, cooperative, no distress, appears stated age  Head:    Normocephalic, without obvious abnormality, atraumatic  Eyes:    PERRL, conjunctiva/corneas clear, EOM's intact, fundi    benign, both eyes  Ears:    Normal TM's and external ear canals, both ears  Nose:   Nares normal, septum midline, mucosa normal, no drainage    or sinus tenderness  Throat:   Lips, mucosa, and tongue normal; teeth and gums normal  Neck:   Supple, symmetrical, trachea midline, no adenopathy;    thyroid:  no enlargement/tenderness/nodules; no carotid   bruit or JVD  Back:     Symmetric, no curvature, ROM normal, no CVA tenderness  Lungs:     Clear to auscultation bilaterally, decreased breath sounds at bases, respirations unlabored  Chest Wall:    No tenderness or deformity   Heart:    Regular rate and rhythm, S1 and S2 normal, no murmur, rub   or gallop  Breast Exam:    No tenderness, masses, or nipple abnormality  Abdomen:     Soft, non-tender, bowel sounds active all four quadrants,    no masses, no organomegaly  Genitalia:    Normal female without lesion, discharge or tenderness  Rectal:    Normal tone, normal prostate, no masses or tenderness;   guaiac negative stool  Extremities:   2+ lower extremity edema upto mid calf, bilateral LE has changes consistent with venous stasis  Pulses:   2+ and symmetric all extremities  Skin:   Skin color, texture, turgor normal, no rashes or lesions  Lymph nodes:   Cervical, supraclavicular, and axillary nodes normal  Neurologic:   CNII-XII intact, normal strength, sensation and reflexes    throughout    Lab results: Basic Metabolic Panel:  Recent Labs  95/28/41 0722  NA 140  K 3.5  CL 107  CO2 20  GLUCOSE 133*  BUN 13  CREATININE 0.68  CALCIUM 8.9   Liver Function Tests:  Recent Labs   07/09/12 0722  AST 21  ALT 17  ALKPHOS 41  BILITOT 0.9  PROT 6.1  ALBUMIN 3.4*   CBC:  Recent Labs  07/09/12 0710  WBC 8.4  HGB 9.8*  HCT 32.0*  MCV 92.0  PLT 269   Cardiac Enzymes:  Recent Labs  07/09/12 0723  TROPONINI <0.30   BNP:  Recent Labs  07/09/12 0723  PROBNP 4276.0*     Recent Labs  07/09/12 0853  COLORURINE STRAW*  LABSPEC 1.009  PHURINE 5.0  GLUCOSEU NEGATIVE  HGBUR NEGATIVE  BILIRUBINUR NEGATIVE  KETONESUR NEGATIVE  PROTEINUR NEGATIVE  UROBILINOGEN 0.2  NITRITE NEGATIVE  LEUKOCYTESUR NEGATIVE     Imaging results:  Dg Chest 2 View (if Patient Has Fever And/or Copd)  07/09/2012   *RADIOLOGY REPORT*  Clinical Data: Shortness of breath  CHEST - 2 VIEW  Comparison: 08/29/2010  Findings: Prior coronary bypass changes.  Heart is enlarged with symmetric diffuse interstitial opacities and small effusions compatible with interstitial edema.  Findings consistent with CHF. Atherosclerosis of the aorta.  Trachea is midline.  No pneumothorax.  IMPRESSION: CHF.   Original Report Authenticated By: Judie Petit. Miles Costain, M.D.    Other results: ZOX:WRUEAV sinus rhythm, left axis deviation, poor R - wave progression, non specific ST- T wave changes,  LVH ,new from old EKG from 07/12  Assessment & Plan by Problem: Active Problems:   Acute CHF (congestive heart failure)   HLD (hyperlipidemia)   COPD (chronic obstructive pulmonary disease)  # Acute respiratory distress: Patient presents with acute overnight worsening of SOB. On exam she had some reduced breath sounds at bases but no wheezes or rhonchi or rales and has b/l lower extremity edema. CXR shows cardiomegaly and vascular congestion. Her pro- BNP is  4200.  She was placed on BiPAP on arrival to the ED with the plans to transition to oxygen via nasal canula when able.Her presentation is likely consistent with new onset CHF( patient denies any history of heart failure). Although she is a current smoker but COPD  exac does not seem to be the cause for her acute respiratory given the above findings.  - Admit to step down  - BiPAP for now - ABG - Cycle tropnins. Check TSH - Obtain 2D echo to look for any structural heart disease, evaluation of EF. - Strict I and O's - Daily weights  - Received one dose of 40 mg of IV lasix in the ED. Would repeat 40 mg IV in the evening - Hear healthy diet - Risk stratification- HbAIC, FLP  # Wide pulse pressure: Her elevated systolic and low diastolic could be related  to her age causing stiffening of her arteries that can increase systolic and decrease elastic recoil that can lower diastolic. Other differentials  include valvular heart disease like AR vs  Thyrotoxicosis vs anemia. - Check TSH - Continue to monitor.   # CAD S/p CABG: Denies active chest pain. Stable.  - Continue ASA, plavix, lipitor and metoprolol  # Anemia: Her baseline Hb~ 14.0 She presents wih Hb ~10.  - Check anemia panel and FOBT. - Trend CBC.   # COPD on home oxygen : stable. Continue home inhalers- spiriva and advair.   # DVT: Lovenox  Dispo: Disposition is deferred at this time, awaiting improvement of current medical problems. Anticipated discharge in approximately 2-3 day(s).   The patient does have a current PCP (Provider Not In System), therefore will be requiring OPC follow-up after discharge.   The patient does not have transportation limitations that hinder transportation to clinic appointments.  Signed: Edi Gorniak 07/09/2012, 10:49 AM

## 2012-07-09 NOTE — Progress Notes (Signed)
Per pt request and md approval took pt off BIPAP and placed on 4L [place] SPO2 remains 95%, HR 83, RR21.  RT to monitor

## 2012-07-09 NOTE — ED Provider Notes (Signed)
Patient seen/examined in the Emergency Department in conjunction with Resident Physician Provider Berline Chough  Patient reports shortness of breath Exam : tachypnea noted.  Bilateral wheezing noted.  She is in distress Plan: will try bipap and will need admission to stepdown unit.  Will follow closely   Joya Gaskins, MD 07/09/12 [phone]

## 2012-07-09 NOTE — Progress Notes (Signed)
  Echocardiogram 2D Echocardiogram has been performed.  Quaron Delacruz FRANCES 07/09/2012, 6:47 PM

## 2012-07-10 ENCOUNTER — Encounter (HOSPITAL_COMMUNITY): Payer: Self-pay | Admitting: Internal Medicine

## 2012-07-10 DIAGNOSIS — J449 Chronic obstructive pulmonary disease, unspecified: Secondary | ICD-10-CM

## 2012-07-10 DIAGNOSIS — I10 Essential (primary) hypertension: Secondary | ICD-10-CM

## 2012-07-10 DIAGNOSIS — J90 Pleural effusion, not elsewhere classified: Secondary | ICD-10-CM

## 2012-07-10 DIAGNOSIS — I509 Heart failure, unspecified: Secondary | ICD-10-CM

## 2012-07-10 LAB — LIPID PANEL
Cholesterol: 101 mg/dL (ref 0–200)
HDL: 40 mg/dL (ref >39)
LDL Cholesterol: 44 mg/dL (ref 0–99)
Triglycerides: 83 mg/dL (ref <150)
VLDL: 17 mg/dL (ref 0–40)

## 2012-07-10 LAB — URINE CULTURE: Culture: NO GROWTH

## 2012-07-10 LAB — COMPREHENSIVE METABOLIC PANEL
BUN: 14 mg/dL (ref 6–23)
CO2: 24 mEq/L (ref 19–32)
Chloride: 108 mEq/L (ref 96–112)
Creatinine, Ser: 0.82 mg/dL (ref 0.50–1.10)
GFR calc Af Amer: 79 mL/min — ABNORMAL LOW (ref >90)
GFR calc non Af Amer: 68 mL/min — ABNORMAL LOW (ref >90)
Glucose, Bld: 105 mg/dL — ABNORMAL HIGH (ref 70–99)
Total Bilirubin: 0.6 mg/dL (ref 0.3–1.2)
Total Protein: 5.6 g/dL — ABNORMAL LOW (ref 6.0–8.3)

## 2012-07-10 LAB — CBC
HCT: 30.9 % — ABNORMAL LOW (ref 36.0–46.0)
Hemoglobin: 9.2 g/dL — ABNORMAL LOW (ref 12.0–15.0)
MCH: 27.6 pg (ref 26.0–34.0)
Platelets: 243 10*3/uL (ref 150–400)
RBC: 3.33 MIL/uL — ABNORMAL LOW (ref 3.87–5.11)
WBC: 8.5 10*3/uL (ref 4.0–10.5)

## 2012-07-10 LAB — TROPONIN I: Troponin I: <0.3 ng/mL (ref <0.30)

## 2012-07-10 MED ORDER — METOPROLOL SUCCINATE ER 100 MG PO TB24
100.0000 mg | ORAL_TABLET | Freq: Every day | ORAL | Status: DC
  Administered 2012-07-11: 100 mg via ORAL
  Filled 2012-07-10: qty 1

## 2012-07-10 MED ORDER — FUROSEMIDE 40 MG PO TABS
60.0000 mg | ORAL_TABLET | Freq: Two times a day (BID) | ORAL | Status: DC
  Filled 2012-07-10 (×3): qty 1

## 2012-07-10 MED ORDER — LISINOPRIL 10 MG PO TABS
10.0000 mg | ORAL_TABLET | Freq: Every day | ORAL | Status: DC
  Administered 2012-07-10 – 2012-07-11 (×2): 10 mg via ORAL
  Filled 2012-07-10 (×3): qty 1

## 2012-07-10 MED ORDER — FUROSEMIDE 20 MG PO TABS
60.0000 mg | ORAL_TABLET | Freq: Every evening | ORAL | Status: AC
  Administered 2012-07-10: 60 mg via ORAL
  Filled 2012-07-10: qty 1

## 2012-07-10 MED ORDER — ASPIRIN EC 81 MG PO TBEC
81.0000 mg | DELAYED_RELEASE_TABLET | Freq: Every day | ORAL | Status: DC
  Administered 2012-07-10 – 2012-07-11 (×2): 81 mg via ORAL
  Filled 2012-07-10 (×3): qty 1

## 2012-07-10 NOTE — Plan of Care (Signed)
Problem: Phase I Progression Outcomes Goal: EF % per last Echo/documented,Core Reminder form on chart Outcome: Completed/Met Date Met:  07/10/12 EF 40-45%(07-09-12)

## 2012-07-10 NOTE — H&P (Signed)
Internal Medicine Attending Admission Note Date: 07/10/2012  Patient name: Belinda Werner Medical record number: [id number] Date of birth: 1934-07-10 Age: 78 y.o. Gender: female  I saw and evaluated the patient. I reviewed the resident's note and I agree with the resident's findings and plan as documented in the resident's note.  Chief Complaint(s): Gradually worsening shortness of breath, orthopnea, and lower extremity edema over one month with an acute worsening over one day.  History - key components related to admission:  Belinda Werner is a 78 year old woman with a history of coronary artery disease status post CABG, chronic obstructive pulmonary disease requiring home O2, hypertension, and hyperlipidemia who presents with a one-month history of progressive dyspnea on exertion, orthopnea, and lower extremity edema. She denies a previous history of congestive heart failure and states that approximately one month ago she had pain in the jaw that radiated down the right arm. She did not seek medical attention for this episode. She's had no further episodes of chest pain, palpitations, nausea, vomiting, or diaphoresis. She also states she's had no fevers, shakes, or chills. She has not increased her fluid intake and does not add any sodium to her food. She's been caring for her sister who is now on hospice for metastatic lung cancer to the brain. Her sister and is anticipated to die within the next 24 hours. She has noted it has been progressively more difficult over the last month to help her sister in her activities of daily living. That being said, she has not been paying much attention to her own medical symptoms or needs during this time. Since admission she was diuresed with intravenous Lasix and is feeling much improved. She is without other complaints at this time.  Physical Exam - key components related to admission:  Filed Vitals:   07/10/12 0751 07/10/12 0827 07/10/12 0900 07/10/12 0925  BP:     114/35  Pulse:   61 60  Temp: 97.7 F (36.5 C)     TempSrc: Oral     Resp:   15 15  Weight:      SpO2:  100% 96% 94%   Gen.: Well-developed, well-nourished, woman sitting comfortably in bed at 62 using breakfast in no acute distress. Neck: Jugular venous pressure of 9 cm of water. Lungs: Decreased breath sounds in the left base with bibasilar inspiratory crackles. No wheezes or rhonchi. Heart: Regular rate and rhythm with a 2/6 crescendo decrescendo murmur at the left upper sternal border that radiates down to the lower sternal border on the left. No gallops or rubs were appreciated. Abdomen: Soft, nontender, active bowel sounds. Extremities: Left lower extremity status post changes consistent with saphenous vein grafting. Bilateral pitting edema that is worse in the dependent areas of the hips and buttocks. There is only 1+ edema in the legs which have been elevated since admission.  Lab results:  Basic Metabolic Panel:  Recent Labs  40/98/11 1719 07/09/12 2023 07/10/12 0553  NA  --  144 143  K  --  4.0 4.2  CL  --  108 108  CO2  --  23 24  GLUCOSE  --  141* 105*  BUN  --  13 14  CREATININE  --  0.75 0.82  CALCIUM  --  8.8 8.6  MG 1.9  --  2.1   Liver Function Tests:  Recent Labs  07/09/12 0722 07/10/12 0553  AST 21 17  ALT 17 14  ALKPHOS 41 36*  BILITOT 0.9 0.6  PROT 6.1  5.6*  ALBUMIN 3.4* 3.1*   CBC:  Recent Labs  07/09/12 0710 07/10/12 0553  WBC 8.4 8.5  HGB 9.8* 9.2*  HCT 32.0* 30.9*  MCV 92.0 92.8  PLT 269 243   Cardiac Enzymes:  Recent Labs  07/09/12 1410 07/09/12 1723 07/09/12 2310  TROPONINI <0.30 <0.30 <0.30   Fasting Lipid Panel:  Recent Labs  07/10/12 0553  CHOL 101  HDL 40  LDLCALC 44  TRIG 83  CHOLHDL 2.5   Urinalysis:  Unremarkable  Misc. Labs:  BNP 4276 TSH 1.040 Total cholesterol 101 Triglycerides 83 HDL 40 LDL 44  Imaging results:  Dg Chest 2 View (if Patient Has Fever And/or Copd)  07/09/2012    *RADIOLOGY REPORT*  Clinical Data: Shortness of breath  CHEST - 2 VIEW  Comparison: 08/29/2010  Findings: Prior coronary bypass changes.  Heart is enlarged with symmetric diffuse interstitial opacities and small effusions compatible with interstitial edema.  Findings consistent with CHF. Atherosclerosis of the aorta.  Trachea is midline.  No pneumothorax.  IMPRESSION: CHF.   Original Report Authenticated By: Judie Petit. Miles Costain, M.D.  Of note, Kerley B lines are present.  Other results:  EKG: Sinus arrythmia at 86 bpm, normal axis and intervals, possible LAHB, no LVH, poor R wave progression, lateral TWI unchanged from the previous ECG on 08/29/2010 (although the LAHB and poor R wave progression are new).  Assessment & Plan by Problem:  Belinda Werner is a 78 y.o. 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  She is found to have inspiratory crackles and LE edema on examination and Kerley B lines on CXR.  This is most consistent with a decompensated cardiomyopathy.  Given her history of jaw and arm pain just prior to the onset of symptoms with her underlying ischemic heart disease I am concerned she may have had an MI 1 month ago now complicated by an ischemic cardiomyopathy.  She also has a murmur c/w AS but the S2 component is present and makes significant AS very unlikely and therefore probably not the cause of a cardiomyopathy.  1) Decompensated cardiomyopathy:  We will continue with the IV lasix diuresis until we see a slight bump in her creatinine.  We will also start lisinopril 10 mg PO QD and continue the toprol XL at her current dose.  An Echo was done yesterday and the reading is pending at this time.  We are also trying to get records from her Cardiologist, although the office may be closed this weekend.  Once adequately  diuresed, she may be ready for discharge home with further medication titration as an outpatient.  If she has a cardiomyopathy on Echo she may benefit from a cath to assess for any intervenable lesions which may uncover hibernating myocardium.  This is probably best done by her primary cardiologist. She is stable for transfer to the general medical ward this morning off of telemetry.  2) COPD: Stable on chronic regimen, we will therefore continue.  3) Disposition: I anticipate she will require several more days of aggressive diuresis before she is euvolemic.  Once euvolemic and on an appropriate regimen she can be discharge with further management as an outpatient.

## 2012-07-10 NOTE — Progress Notes (Addendum)
Subjective: Patient states she was drinking at least 4-5 at least 12 oz glasses of water per day.  She denies increased sodium intake.  She reports she was compliant with medications prior to admission.  She had a least 1 MI in the past that she is knows.  She has at least 3-4 stents in her heart and recently had one placed in [facility] 2 months ago.  She denies taking Bystolic or Imdur at home which cardiology notes indicate.  She recently saw a Vasc. Surgeon about PVD and he rec. B/l amputations of her legs which she does not plan to revisit the cardiologist for follow up.  She denies chest pain, sob is improved.  She has been smoking since age 78 y.o  Objective: Vital signs in last 24 hours: Filed Vitals:   07/10/12 0827 07/10/12 0900 07/10/12 0925 07/10/12 1137  BP:   114/35   Pulse:  61 60   Temp:    98.7 F (37.1 C)  TempSrc:    Oral  Resp:  15 15   Weight:      SpO2: 100% 96% 94%    Weight change:   Intake/Output Summary (Last 24 hours) at 07/10/12 1205 Last data filed at 07/10/12 1137  Gross per 24 hour  Intake    243 ml  Output   2425 ml  Net  -2182 ml   Vitals reviewed. General: resting in bed, NAD, [place] intact on 2L, alert and oriented x 3 HEENT: [place]/at, no scleral icterus Cardiac: RRR, no rubs, murmurs or gallops Pulm: decreased aeration lung bases Abd: soft, nontender, nondistended, BS present, obese Ext: warm and well perfused, 2+ edema, faint distal pulses with erythema to lower extremities b/l Neuro: alert and oriented X3, grossly neurologically intact   Lab Results: Basic Metabolic Panel:  Recent Labs Lab 07/09/12 1719 07/09/12 2023 07/10/12 0553  NA  --  144 143  K  --  4.0 4.2  CL  --  108 108  CO2  --  23 24  GLUCOSE  --  141* 105*  BUN  --  13 14  CREATININE  --  0.75 0.82  CALCIUM  --  8.8 8.6  MG 1.9  --  2.1   Liver Function Tests:  Recent Labs Lab 07/09/12 0722 07/10/12 0553  AST 21 17  ALT 17 14  ALKPHOS 41 36*  BILITOT 0.9 0.6   PROT 6.1 5.6*  ALBUMIN 3.4* 3.1*   CBC:  Recent Labs Lab 07/09/12 0710 07/10/12 0553  WBC 8.4 8.5  HGB 9.8* 9.2*  HCT 32.0* 30.9*  MCV 92.0 92.8  PLT 269 243   Cardiac Enzymes:  Recent Labs Lab 07/09/12 1410 07/09/12 1723 07/09/12 2310  TROPONINI <0.30 <0.30 <0.30   BNP:  Recent Labs Lab 07/09/12 0723  PROBNP 4276.0*   Fasting Lipid Panel:  Recent Labs Lab 07/10/12 0553  CHOL 101  HDL 40  LDLCALC 44  TRIG 83  CHOLHDL 2.5   Thyroid Function Tests:  Recent Labs Lab 07/09/12 1130  TSH 1.040   Urinalysis:  Recent Labs Lab 07/09/12 0853  COLORURINE STRAW*  LABSPEC 1.009  PHURINE 5.0  GLUCOSEU NEGATIVE  HGBUR NEGATIVE  BILIRUBINUR NEGATIVE  KETONESUR NEGATIVE  PROTEINUR NEGATIVE  UROBILINOGEN 0.2  NITRITE NEGATIVE  LEUKOCYTESUR NEGATIVE   Misc. Labs: Anemia panel  HA1C  Micro Results: Recent Results (from the past 240 hour(s))  URINE CULTURE     Status: None   Collection Time    07/09/12  8:53 AM      Result Value Range Status   Specimen Description URINE, CATHETERIZED   Final   Special Requests NONE   Final   Culture  Setup Time 07/09/2012 09:58   Final   Colony Count NO GROWTH   Final   Culture NO GROWTH   Final   Report Status 07/10/2012 FINAL   Final  MRSA PCR SCREENING     Status: None   Collection Time    07/09/12 11:22 AM      Result Value Range Status   MRSA by PCR NEGATIVE  NEGATIVE Final   Comment:            The GeneXpert MRSA Assay (FDA     approved for NASAL specimens     only), is one component of a     comprehensive MRSA colonization     surveillance program. It is not     intended to diagnose MRSA     infection nor to guide or     monitor treatment for     MRSA infections.   Studies/Results: Dg Chest 2 View (if Patient Has Fever And/or Copd)  07/09/2012   *RADIOLOGY REPORT*  Clinical Data: Shortness of breath  CHEST - 2 VIEW  Comparison: 08/29/2010  Findings: Prior coronary bypass changes.  Heart is  enlarged with symmetric diffuse interstitial opacities and small effusions compatible with interstitial edema.  Findings consistent with CHF. Atherosclerosis of the aorta.  Trachea is midline.  No pneumothorax.  IMPRESSION: CHF.   Original Report Authenticated By: Judie Petit. Miles Costain, M.D.   Medications:  Scheduled Meds: . aspirin EC  81 mg Oral Daily  . atorvastatin  80 mg Oral Daily  . clopidogrel  75 mg Oral Daily  . enoxaparin (LOVENOX) injection  40 mg Subcutaneous Q24H  . famotidine  20 mg Oral BID  . furosemide  60 mg Oral BID  . lisinopril  10 mg Oral Daily  . [START ON 07/11/2012] metoprolol succinate  100 mg Oral Daily  . mometasone-formoterol  2 puff Inhalation BID  . potassium chloride  40 mEq Oral Daily  . sodium chloride  3 mL Intravenous Q12H  . tiotropium  18 mcg Inhalation Daily   Continuous Infusions:  PRN Meds:.acetaminophen, acetaminophen, alum & mag hydroxide-simeth, ondansetron (ZOFRAN) IV, ondansetron Assessment/Plan: 78 y.o. woman with a history of CAD s/p CABG and stenting, MI, O2 requiring COPD, active tobacco abuse, PVD  who presents with a 1 month history of progressive shortness of breath, lower extremity edema.    1) Acute heart failure  -She has a history of diastolic dysfunction and was drinking a lot of fluids at home prior to admission which could have cause acute diastolic heart failure.  Per records 09/2008 echo EF 55%.  There is concern for ischemia. Patient told me today she had a another stent placed 2 months ago. -CXR with Kerly B lines and pleural effusions and she had lower extremity edema on exam.  -Started Lisinopril 10 mg.  Reviewed records from Dr. Heron Nay cardiologist [facility]).  Patient was on Azor 5-20 mg qd prior to admission.  She denies taking Imdur 30 or Bystoloic 10 which are noted in his notes  -continue diuresis changed Lasix 40 mg bid iv to 60 mg bid orally. Continue diuresis until increased Creatinine.  On K 40 meQ qd, continue  Toprol XL 100 mg qd, statin -D/c foley, strict i/o, daily weights  -pending echo read to follow pending read she may need  cath by her cardiologist -transfer tele  2. History of CAD s/p CABG and stenting -she reports recent stent 2 months ago. Total 3-4 stents  -continue Plavix 75 mg qd, Asprin 325 mg decreased to 81 mg. Continue Toprol XL 100 mg qd (of note outside records say the patient should be on Imdur and Bystolic but she denies taking), statin.  Started Lisinopril 10 mg  3. COPD -Stable on [employer] 2 L.  Will continue to monitor. On spiriva and Dulera  4. Bilateral pleural effusions -Noted on CXR likely due to decompensated CHF -continue diuresis changed Lasix 40 mg bid iv to 60 mg bid orally -strict i/o, daily weights  -may need repeat CXR prior to discharge for f/u  5. HTN history  -Monitor BP. This am 136/48. -Now on Lasix 60 mg bid, Aspirin decreased 325 mg qd to 81 mg, Lipitor 80 mg, Toprol XL 100 mg qd (hold if HR <55). statin  6. PVD -She went to [facility] who rec. B/l BKA but she refused and is due for f/u soon. She wants a second opinion.  Has another appt 07/13/12 same place different physician -Will need outpatient referral by PCP to another Vascular surgeon. Communicate this with PCP -smoking cessation   7. Tobacco abuse  -Smoking cessation  8. F/E/N -will trend BMET -cardiac diet   9. DVT px  -Lovenox    Dispo: Transfer to tele.  Disposition is deferred at this time, awaiting improvement of current medical problems.  Anticipated discharge in approximately 3-5 day(s).   The patient does have a current PCP Dr. Glori Luis in [facility] therefore will not be requiring OPC follow-up after discharge.   The patient does not have transportation limitations that hinder transportation to clinic appointments.  .Services Needed at time of discharge: Y = Yes, Blank = No PT:   OT:   RN:   Equipment:   Other:     LOS: 1 day   Annett Gula  [phone] 07/10/2012, 12:05 PM

## 2012-07-11 LAB — FERRITIN: Ferritin: 25 ng/mL (ref 10–291)

## 2012-07-11 LAB — BASIC METABOLIC PANEL
BUN: 25 mg/dL — ABNORMAL HIGH (ref 6–23)
Calcium: 8.7 mg/dL (ref 8.4–10.5)
GFR calc Af Amer: 62 mL/min — ABNORMAL LOW (ref >90)
GFR calc non Af Amer: 54 mL/min — ABNORMAL LOW (ref >90)
Glucose, Bld: 108 mg/dL — ABNORMAL HIGH (ref 70–99)
Sodium: 139 mEq/L (ref 135–145)

## 2012-07-11 LAB — MAGNESIUM: Magnesium: 2.1 mg/dL (ref 1.5–2.5)

## 2012-07-11 LAB — RETICULOCYTES
RBC.: 3.23 MIL/uL — ABNORMAL LOW (ref 3.87–5.11)
Retic Ct Pct: 2.7 % (ref 0.4–3.1)

## 2012-07-11 LAB — IRON AND TIBC: TIBC: 281 ug/dL (ref 250–470)

## 2012-07-11 MED ORDER — POTASSIUM CHLORIDE CRYS ER 20 MEQ PO TBCR: 40.0000 meq | EXTENDED_RELEASE_TABLET | Freq: Every day | ORAL | Status: DC

## 2012-07-11 MED ORDER — FUROSEMIDE 40 MG PO TABS: 40.0000 mg | ORAL_TABLET | Freq: Every day | ORAL | Status: DC

## 2012-07-11 MED ORDER — LISINOPRIL 10 MG PO TABS: 10.0000 mg | ORAL_TABLET | Freq: Every day | ORAL | Status: DC

## 2012-07-11 MED ORDER — FUROSEMIDE 40 MG PO TABS
40.0000 mg | ORAL_TABLET | Freq: Two times a day (BID) | ORAL | Status: DC
  Administered 2012-07-11: 40 mg via ORAL
  Filled 2012-07-11 (×2): qty 1

## 2012-07-11 NOTE — Progress Notes (Signed)
Internal Medicine Attending  Date: 07/11/2012  Patient name: Belinda Werner Medical record number: [id number] Date of birth: October 20, 1934 Age: 78 y.o. Gender: female  I saw and evaluated the patient. I reviewed the resident's note by Dr. Earlene Plater and I agree with the resident's findings and plans as documented in his progress note.  [name] feels markedly improved today. She states her breathing is at baseline. She also slept well last night without any orthopnea or paroxysmal nocturnal dyspnea. Her sister survived the night but still remains critically ill and is expected to die soon. It's very important for [name] to be at her sister's bedside and she is requesting discharge today. With her net 5 L of diuresis and marked improvement in her symptoms I feel this is a very reasonable request given the circumstances. We will discharge her home today on Lasix 40 mg by mouth daily and have her followup with her primary care provider.

## 2012-07-11 NOTE — Progress Notes (Signed)
Subjective:    Interval Events:  Patient feels well this morning. Much better than at admission. She thinks her breathing is back to baseline at this point. She denies chest pain.     Objective:    Vital Signs:   Temp:  [97.5 F-98.7 F] 98.2 F (36.8 C) (06/01 0500) Pulse Rate:  [55-68] 55 (06/01 0500) Resp:  [18-22] 18 (06/01 0500) BP: (90-122)/(34-68) 122/40 mmHg (06/01 0500) SpO2:  [95 %-100 %] 97 % (06/01 0816)   Weights: 24-hour Weight change: -3 lb 9.6 oz (-1.632 kg)  Filed Weights   07/10/12 0300 07/10/12 1731 07/11/12 0500  Weight: 181 lb (82.1 kg) 177 lb 6.4 oz (80.468 kg) 175 lb 3.2 oz (79.47 kg)   Net since admission:  - 1 kg   Intake/Output:   Gross per 24 hour  Intake    460 ml  Output   2275 ml  Net  -1815 ml     Net since admission:  -5 L  Last BM Date: 07/11/12   Physical Exam: GENERAL: alert and oriented; resting comfortably in bed and in no distress EYES: pupils equal, round, and reactive to light; sclera anicteric ENT: moist mucosa LUNGS: diminished sounds in the bases, no rales, normal work of breathing HEART: normal rate; regular rhythm; 2/6 systolic murmur heard best at upper sternal borders; no diastolic murmurs ABDOMEN: soft, non-tender, normal bowel sounds, no masses palpated EXTREMITIES: 1+ edema SKIN: normal turgor    Labs: Basic Metabolic Panel: Lab 07/09/12 0722 07/09/12 1719 07/09/12 2023 07/10/12 0553 07/11/12 0500  NA 140  --  144 143 139  K 3.5  --  4.0 4.2 3.9  CL 107  --  108 108 103  CO2 20  --  23 24 26   GLUCOSE 133*  --  141* 105* 108*  BUN 13  --  13 14 25*  CREATININE 0.68  --  0.75 0.82 1.00  CALCIUM 8.9  --  8.8 8.6 8.7  MG  --  1.9  --  2.1 2.1    Microbiology: Results for orders placed during the hospital encounter of 07/09/12  URINE CULTURE     Status: None   Collection Time    07/09/12  8:53 AM      Result Value Range Status   Specimen Description URINE, CATHETERIZED   Final   Special  Requests NONE   Final   Culture  Setup Time 07/09/2012 09:58   Final   Colony Count NO GROWTH   Final   Culture NO GROWTH   Final   Report Status 07/10/2012 FINAL   Final    Imaging: 2-D echocardiogram 07/09/2012 CONCLUSIONS: 1. EF 40-45%, akinesis of mid-distalanteroseptal myocardium and hypokinesis of distalinferior myocardium. 2. Grade 2 diastolic dysfunction. 3. Calcific aortic valve annulus with trivial regurgitation. 4. Calcific mitral valve annulus with moderate regurgitation. 5. Moderately dilated left atrium. 6. Mildly reduced systolic function of right ventricle. 7. Moderately dilated right atrium. 8. Moderate tricuspid regurgitation. 9. Severely increased pulmonic artery pressure, estimated at 95 mmHg.    Medications:    Infusions:     Scheduled Medications: . aspirin EC  81 mg Oral Daily  . atorvastatin  80 mg Oral Daily  . clopidogrel  75 mg Oral Daily  . enoxaparin (LOVENOX) injection  40 mg Subcutaneous Q24H  . famotidine  20 mg Oral BID  . furosemide  60 mg Oral BID  . lisinopril  10 mg Oral Daily  . metoprolol succinate  100 mg  Oral Daily  . mometasone-formoterol  2 puff Inhalation BID  . potassium chloride  40 mEq Oral Daily  . sodium chloride  3 mL Intravenous Q12H  . tiotropium  18 mcg Inhalation Daily     PRN Medications: acetaminophen, acetaminophen, alum & mag hydroxide-simeth, ondansetron (ZOFRAN) IV, ondansetron    Assessment/ Plan:    1.   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 She has diuresed well here. Net negative 5 L. Creatinine has steadily risen since admission from 0.75-1.0 today. BUN demonstrated an abrupt increase today from 14-25. It was 13 at admission. This may indicate a turn towards hypovolemia from the hypervolemia she had at admission. - Continue metoprolol succinate 100 mg daily - Continue lisinopril 10 mg daily - Decrease furosemide from 60 mg twice a  day to 40 mg twice a day, consider further decrease to QD dosing - Continue potassium chloride 40 mEq daily  2.   Coronary artery disease:  Status post CABG and PCI. Most recent PCI 2 months ago. Patient is on Plavix and aspirin. Regional wall motion abnormalities and associated Q waves on EKG are consistent with prior infarction of anterior-inferior wall and inferior septum. - Continue aspirin 81 mg daily - Continue clopidogrel 75 mg daily - Continue atorvastatin 80 mg daily  3.   Pulmonary hypertension:  Peak PA pressure 95 mmHg on echocardiogram. COPD and left heart failure have both contributed to this.  4.   COPD:  Stable.  On fluticasone-salmeterol 115-21 2 puffs twice a day and tiotropium daily at home.  - Continue tiotropium daily - Continue mometasone-formoterol 100-52 puffs twice a day  - Wean patient to room air  5.   Peripheral vascular disease:  The patient says a vascular surgeon recently recommended bilateral BKA for arterial insufficiency. She currently has no wounds on her feet or lower extremities. She does report claudication with exertion and occasional rest pain. She is already on antiplatelet therapy and statin therapy.  - Recommend PCP refer to different vascular surgery practice for second opinion, at patient's wish   6.   Prophylaxis: enoxaparin  7.   Disposition:  Patient has a PCP and cardiologist. She will need to followup with both at discharge. Followup appointments need to be made. Expected discharge day today or tomorrow.    Length of Stay: 2 days   Signed by:  Dorthula Rue. Earlene Plater, MD PGY-I, Internal Medicine Pager [phone]  07/11/2012, 10:27 AM

## 2012-07-11 NOTE — Discharge Summary (Signed)
Patient Name:  Belinda Werner MRN: [id number]  PCP: Provider Not In System DOB:  08/19/1934       Date of Admission:  07/09/2012  Date of Discharge:  07/11/2012      Attending Physician: Rocco Serene, MD         DISCHARGE DIAGNOSES: 1. Acute exacerbation of chronic systolic and diastolic heart failure 2. Coronary artery disease 3. Pulmonary hypertension 4. COPD 5. Peripheral vascular disease    DISPOSITION AND FOLLOW-UP: Belinda Werner is to follow-up with the listed providers as detailed below, at which time, the following should be addressed:  1. Follow-up visits: 1. BP MEDS:  We stopped amlodipine-olmesartan. Discharged on metoprolol succinate 100 mg QD and lisinopril 10 mg QD. 2. PULMONARY HTN:  Peak PA pressure found to be 95 mmHg on echocardiogram here. 3. PERIPHERAL VASCULAR DISEASE: Pt says vascular surgeon recommended bilateral BKA. We are not sure what the indication for such an operation is. Patient would like a second opinion.  If possible, please refer patient to a different vascular surgery practice. 4. HEART FAILURE:  Discharged on furosemide 40 mg QD with potassium supplementation. 5. IRON DEFICIENCY:  After discharge, iron panel resulted with low iron at 17, low-normal ferritin at 25, and low saturation ratio at 6.  TIBC was 281.  Consider iron supplementation.  2. Labs and images needed: 1. BMP:  Monitor renal function and potassium.  3. Pending labs and tests needing follow-up:  NONE   Follow-up Information   Follow up with your primary care physician. Schedule an appointment as soon as possible for a visit in 1 week.      Follow up with Merrily Pew A, MD. Schedule an appointment as soon as possible for a visit in 1 week.   Contact information:   [address] [place] [address] [phone]      Discharge Orders   Future Orders Complete By Expires     (HEART FAILURE PATIENTS) Call MD:  Anytime you have any of the following  symptoms: 1) 3 pound weight gain in 24 hours or 5 pounds in 1 week 2) shortness of breath, with or without a dry hacking cough 3) swelling in the hands, feet or stomach 4) if you have to sleep on extra pillows at night in order to breathe.  As directed     Call MD for:  difficulty breathing, headache or visual disturbances  As directed     Call MD for:  extreme fatigue  As directed     Call MD for:  persistant dizziness or light-headedness  As directed     Diet - low sodium heart healthy  As directed     Increase activity slowly  As directed         DISCHARGE MEDICATIONS:   Medication List    STOP taking these medications       AZOR 5-20 MG per tablet  Generic drug:  amLODipine-olmesartan      TAKE these medications       aspirin 325 MG tablet  Take 325 mg by mouth daily.     atorvastatin 80 MG tablet  Commonly known as:  LIPITOR  Take 80 mg by mouth daily.     clopidogrel 75 MG tablet  Commonly known as:  PLAVIX  Take 75 mg by mouth daily.     famotidine 20 MG tablet  Commonly known as:  PEPCID  Take 20 mg by mouth 2 (two) times daily.  fluticasone-salmeterol 115-21 MCG/ACT inhaler  Commonly known as:  ADVAIR HFA  Inhale 2 puffs into the lungs 2 (two) times daily.     furosemide 40 MG tablet  Commonly known as:  LASIX  Take 1 tablet (40 mg total) by mouth daily.     lisinopril 10 MG tablet  Commonly known as:  PRINIVIL,ZESTRIL  Take 1 tablet (10 mg total) by mouth daily.     metoprolol succinate 100 MG 24 hr tablet  Commonly known as:  TOPROL-XL  Take 100 mg by mouth daily.     potassium chloride SA 20 MEQ tablet  Commonly known as:  K-DUR,KLOR-CON  Take 2 tablets (40 mEq total) by mouth daily.     tiotropium 18 MCG inhalation capsule  Commonly known as:  SPIRIVA  Place 18 mcg into inhaler and inhale daily.         CONSULTS:  NONE   PROCEDURES PERFORMED:  Dg Chest 2 View (if Patient Has Fever And/or Copd) 07/09/2012  FINDINGS: Prior coronary  bypass changes.  Heart is enlarged with symmetric diffuse interstitial opacities and small effusions compatible with interstitial edema.  Findings consistent with CHF. Atherosclerosis of the aorta.  Trachea is midline.  No pneumothorax.  IMPRESSION: CHF.  2-D echocardiogram with contrast 07/09/2012 CONCLUSIONS: 1. EF 40-45% with akinesis of mid-distalanteroseptal myocardium and hypokinesis of distalinferior myocardium. 2. Grade 2 diastolic dysfunction. 3. Mildly calcific aortic valve annulus with trivial regurgitation. 4. Calcific mitral valve annulus with moderate regurgitation directed eccentrically. 5. Moderately dilated left atrium. 6. Mildly dilated right ventricle, wall thickness mildly increased, and systolic function mildly reduced. 7. Right atrium moderately dilated with central venous pressure at least 15 mmHg. 8. Moderate tricuspid valve regurgitation. 9. Systolic pulmonary artery pressures severely increased with peak PA pressure 95 mmHg. 10. No pericardial effusion.     ADMISSION DATA: H&P: A 78-year-old woman with past medical history significant for coronary artery disease status post CABG ~ 7 yrs ago , COPD on 2 L of home oxygen presents to the ED with shortness of breath.  Patient was on BiPAP during my evaluation and most of the history was obtained, with her writing on a piece of paper.  Patient reports that she has been noticing gradually worsening swelling of her legs for last 1 month. She also reports that she has been having orthopnea for last few weeks. She started developing acute shortness of breath last evening that continued to get worse overnight that prompted her to come to the ED. She denies any chest pain, palpitations, fever, chills, nausea or vomiting. 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  Her cardiologist is Dr. Bary Castilla at [facility].   Physical Exam: Vitals: Blood pressure 139/46, pulse 87, temperature 98 F (36.7 C), temperature source  Oral, resp. rate 24, SpO2 95.00%.  General Appearance:  Alert, cooperative, no distress, appears stated age   Head:  Normocephalic, without obvious abnormality, atraumatic   Eyes:  PERRL, conjunctiva/corneas clear, EOM's intact, fundi  benign, both eyes   Ears:  Normal TM's and external ear canals, both ears   Nose:  Nares normal, septum midline, mucosa normal, no drainage or sinus tenderness   Throat:  Lips, mucosa, and tongue normal; teeth and gums normal   Neck:  Supple, symmetrical, trachea midline, no adenopathy;  thyroid: no enlargement/tenderness/nodules; no carotid  bruit or JVD   Back:  Symmetric, no curvature, ROM normal, no CVA tenderness   Lungs:  Clear to auscultation bilaterally,  decreased breath sounds at bases, respirations unlabored   Chest Wall:  No tenderness or deformity   Heart:  Regular rate and rhythm, S1 and S2 normal, no murmur, rub or gallop   Breast Exam:  No tenderness, masses, or nipple abnormality   Abdomen:  Soft, non-tender, bowel sounds active all four quadrants,  no masses, no organomegaly   Genitalia:  Normal female without lesion, discharge or tenderness   Rectal:  Normal tone, normal prostate, no masses or tenderness;  guaiac negative stool   Extremities:  2+ lower extremity edema upto mid calf, bilateral LE has changes consistent with venous stasis   Pulses:  2+ and symmetric all extremities   Skin:  Skin color, texture, turgor normal, no rashes or lesions   Lymph nodes:  Cervical, supraclavicular, and axillary nodes normal   Neurologic:  CNII-XII intact, normal strength, sensation and reflexes  throughout     Labs: Basic Metabolic Panel:   07/09/12 0722   NA  140   K  3.5   CL  107   CO2  20   GLUCOSE  133*   BUN  13   CREATININE  0.68   CALCIUM  8.9    Liver Function Tests:    07/09/12 0722   AST  21   ALT  17   ALKPHOS  41   BILITOT  0.9   PROT  6.1   ALBUMIN  3.4*    CBC:   07/09/12 0710   WBC  8.4   HGB  9.8*   HCT   32.0*   MCV  92.0   PLT  269    Cardiac Enzymes:   07/09/12 0723   TROPONINI  <0.30    BNP:   07/09/12 0723   PROBNP  4276.0*    Urinalysis:   07/09/12 0853   COLORURINE  STRAW*   LABSPEC  1.009   PHURINE  5.0   GLUCOSEU  NEGATIVE   HGBUR  NEGATIVE   BILIRUBINUR  NEGATIVE   KETONESUR  NEGATIVE   PROTEINUR  NEGATIVE   UROBILINOGEN  0.2   NITRITE  NEGATIVE   LEUKOCYTESUR  NEGATIVE     HOSPITAL COURSE: 1.   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 She has diuresed well here. Net negative 5 L.  She was given furosemide 40 mg IV twice on 5/30 and once on 5/31. She was started on oral furosemide at 60 mg in the evening of 5/31. On 6/1, she was given 40 mg of furosemide orally and discharged home on 40 mg daily. Creatinine has steadily risen since admission from 0.75-1.0 today. BUN demonstrated an abrupt increase today from 14-25. It was 13 at admission. This may indicate a turn towards hypovolemia from the hypervolemia she had at admission. Discharged home on metoprolol, lisinopril, furosemide 40 mg daily, and potassium chloride 40 mEq daily.  2.   Coronary artery disease:  Status post CABG and PCI. Most recent PCI 2 months ago. Patient is on Plavix and aspirin. Regional wall motion abnormalities and associated Q waves on EKG are consistent with prior infarction of anterior-inferior wall and inferior septum. Discharged home on her prior regimen of aspirin, clopidogrel, and atorvastatin.  3.   Pulmonary hypertension:  Peak PA pressure 95 mmHg on echocardiogram. COPD and left heart failure have both contributed to this.   4.   COPD:  Stable. On fluticasone-salmeterol 115-21 2 puffs twice a day and  tiotropium daily at home. No changes made.  5.   Peripheral vascular disease:  The patient says a vascular surgeon recently recommended bilateral BKA for arterial insufficiency. She currently has no wounds on her feet  or lower extremities. She does report claudication with exertion and occasional rest pain. She is already on antiplatelet therapy and statin therapy. Refer patient to a different vascular surgery practice per the patient's wish.   DISCHARGE DATA: Vital Signs: BP 136/61  Pulse 58  Temp(Src) 98.2 F (36.8 C) (Oral)  Resp 18  Ht 5\' 1"  (1.549 m)  Wt 175 lb 3.2 oz (79.47 kg)  BMI 33.12 kg/m2  SpO2 97%  Labs: Basic Metabolic Panel:  Lab  07/09/12 0722  07/09/12 1719  07/09/12 2023  07/10/12 0553  07/11/12 0500   NA  140  --  144  143  139   K  3.5  --  4.0  4.2  3.9   CL  107  --  108  108  103   CO2  20  --  23  24  26    GLUC 133*  --  141*  105*  108*   BUN  13  --  13  14  25*   CR 0.68  --  0.75  0.82  1.00   CALCIUM  8.9  --  8.8  8.6  8.7   MG  --  1.9  --  2.1  2.1    Liver Function Tests:  Lab  07/09/12 0722  07/10/12 0553   AST  21  17   ALT  17  14   ALKPHOS  41  36*   BILITOT  0.9  0.6   PROT  6.1  5.6*   ALBUMIN  3.4*  3.1*    CBC:  Lab  07/09/12 0710  07/10/12 0553   WBC  8.4  8.5   HGB  9.8*  9.2*   HCT  32.0*  30.9*   MCV  92.0  92.8   PLT  269  243    Cardiac Enzymes:  Lab  07/09/12 1410  07/09/12 1723  07/09/12 2310   TROP <0.30  <0.30  <0.30    BNP:  Lab  07/09/12 0723   PROBNP  4276.0*    Fasting Lipid Panel:  Lab  07/10/12 0553   CHOL  101   HDL  40   LDLCALC  44   TRIG  83   CHOLHDL  2.5    Thyroid Function Tests:  Lab  07/09/12 1130   TSH  1.040    Urinalysis:  Lab  07/09/12 0853   COLORURINE  STRAW*   LABSPEC  1.009   PHURINE  5.0   GLUCOSEU  NEGATIVE   HGBUR  NEGATIVE   BILIRUBINUR  NEGATIVE   KETONESUR  NEGATIVE   PROTEINUR  NEGATIVE   UROBILINOGEN  0.2   NITRITE  NEGATIVE   LEUKOCYTESUR  NEGATIVE    Anemia Panel:  Component Value Date/Time   IRON 17* 07/11/2012 0500   TIBC 281 07/11/2012 0500   FERRITIN 25 07/11/2012 0500   SAT RATIO 6* 07/11/2012 0500   FOLATE 17.9 07/11/2012 0500   VIT B12 347 07/11/2012  0500   RETIC CT PCT 2.7 07/11/2012 0500   Hemoglobin A1c:  Lab  07/09/12 1130   A1c 6.1*    Time spent on discharge: 38 minutes   Services Ordered on Discharge: 1. PT - no 2. OT - no 3.  RN - no 4. Other - no   Signed by:  Dorthula Rue. Earlene Plater, MD PGY-I, Internal Medicine  07/12/2012, 4:44 PM

## 2012-07-11 NOTE — Progress Notes (Signed)
Md requests to wean pt to room air- pt states she has been on oxygen at 2l nasal cannula continuously since October. Becomes SOB with exertion even at home since Oct

## 2012-07-14 NOTE — Discharge Summary (Signed)
Iron panel is most consistent with anemia of chronic disease.  Very early iron deficiency can not be ruled out, but it is not yet the working diagnosis.

## 2012-08-01 ENCOUNTER — Other Ambulatory Visit (HOSPITAL_COMMUNITY): Payer: Self-pay | Admitting: Internal Medicine

## 2012-08-02 NOTE — Telephone Encounter (Signed)
This patient was discharged home from our service on 07/11/12 on these medications with instructions to follow up with her PCP.  Please find out if she has done so.

## 2012-08-10 NOTE — Telephone Encounter (Signed)
Have been unable to reach pt until today, she states that she sees a dr Eber Hong and has followed up with that md, she states that dr Eber Hong will refill her meds as appropriate

## 2013-09-08 ENCOUNTER — Other Ambulatory Visit: Payer: Self-pay | Admitting: *Deleted

## 2013-09-08 DIAGNOSIS — I739 Peripheral vascular disease, unspecified: Secondary | ICD-10-CM

## 2013-10-13 ENCOUNTER — Encounter: Payer: Self-pay | Admitting: Surgery

## 2013-10-14 ENCOUNTER — Ambulatory Visit (HOSPITAL_COMMUNITY)
Admission: RE | Admit: 2013-10-14 | Discharge: 2013-10-14 | Disposition: A | Discharge status: Home / Self Care | Payer: Medicare Other | Source: Ambulatory Visit | Attending: Surgery | Admitting: Surgery

## 2013-10-14 ENCOUNTER — Ambulatory Visit (INDEPENDENT_AMBULATORY_CARE_PROVIDER_SITE_OTHER): Payer: Medicare Other | Admitting: Surgery

## 2013-10-14 ENCOUNTER — Encounter: Payer: Self-pay | Admitting: Surgery

## 2013-10-14 ENCOUNTER — Ambulatory Visit (INDEPENDENT_AMBULATORY_CARE_PROVIDER_SITE_OTHER)
Admission: RE | Admit: 2013-10-14 | Discharge: 2013-10-14 | Disposition: A | Discharge status: Home / Self Care | Payer: Medicare Other | Source: Ambulatory Visit | Attending: Surgery | Admitting: Surgery

## 2013-10-14 VITALS — BP 181/46 | HR 54 | Ht 61.0 in | Wt 163.6 lb

## 2013-10-14 DIAGNOSIS — I739 Peripheral vascular disease, unspecified: Secondary | ICD-10-CM

## 2013-10-14 DIAGNOSIS — I6529 Occlusion and stenosis of unspecified carotid artery: Secondary | ICD-10-CM

## 2013-10-14 DIAGNOSIS — Z48812 Encounter for surgical aftercare following surgery on the circulatory system: Secondary | ICD-10-CM

## 2013-10-14 NOTE — Progress Notes (Signed)
Patient name: Belinda Werner MRN: [id number] DOB: 09-28-1934 Sex: female   Referred by: Dr. Alvera Novel  Reason for referral:  Chief Complaint  Patient presents with  . New Evaluation    PVD -hx of bilateral stents at [facility]    HISTORY OF PRESENT ILLNESS: This is a very pleasant 79 year old female who is referred for vascular care.  She has an extensive history of vascular surgery performed and [place].  She is status post left carotid stenting in 2009.  She had a right carotid endarterectomy in 2003.  She has had iliac stenting performed as well with repeat dilatation.  She has a history of coronary artery disease and has had multiple percutaneous stents.  She suffers from COPD secondary to tobacco abuse.  She is on home oxygen 2 L.  The patient states that for the past 6 months she has been having worsening leg pain.  She describes this as tingling in her feet.  The left leg is worse than the right.  She does state that she will get cramping in her left calf with prolonged walking but this does not happen on a daily basis.  She denies rest pain or nonhealing wounds.  Past Medical History  Diagnosis Date  . Hypertension   . Hypercholesterolemia   . CAD (coronary artery disease)     sig CAD previously in RCA, Cx; history of 3-4 stents and h/o CABG  . COPD (chronic obstructive pulmonary disease)   . MI (myocardial infarction)     history  . PVD (peripheral vascular disease)     refused amputation in the past   . Diastolic dysfunction     echo 05/979 EF 55%, mild to mod LVH, LA mild to mod dilated  . Pneumonia     Past Surgical History  Procedure Laterality Date  . Coronary artery bypass graft    . Abdominal hysterectomy    . Tonsillectomy    . Cholecystectomy    . Appendectomy    . Other surgical history      ?heart valve replacement    History   Social History  . Marital Status: Divorced    Spouse Name: N/A    Number of Children: N/A  . Years of Education: N/A    Occupational History  . Not on file.   Social History Main Topics  . Smoking status: Current Every Day Smoker -- 0.50 packs/day for 50 years    Types: Cigarettes  . Smokeless tobacco: Not on file  . Alcohol Use: No  . Drug Use: No  . Sexual Activity: No   Other Topics Concern  . Not on file   Social History Narrative   Smoking since age 27 y.o    Family History  Problem Relation Age of Onset  . Diabetes Mother   . Varicose Veins Mother   . Cancer Father   . Cancer Sister   . Diabetes Daughter   . Heart disease Daughter   . Hypertension Daughter   . Heart attack Daughter     before age 44  . Heart disease Son   . Hypertension Son   . Heart attack Son     before age 44    Allergies as of 10/14/2013 - Review Complete 10/14/2013  Allergen Reaction Noted  . Penicillins Hives 08/29/2010  . Avelox [moxifloxacin hcl in nacl]  07/10/2012    Current Outpatient Prescriptions on File Prior to Visit  Medication Sig Dispense Refill  . aspirin 325  MG tablet Take 325 mg by mouth daily.        Marland Kitchen atorvastatin (LIPITOR) 80 MG tablet Take 80 mg by mouth daily.        . clopidogrel (PLAVIX) 75 MG tablet Take 75 mg by mouth daily.        . famotidine (PEPCID) 20 MG tablet Take 20 mg by mouth 2 (two) times daily.        . fluticasone-salmeterol (ADVAIR HFA) 115-21 MCG/ACT inhaler Inhale 2 puffs into the lungs 2 (two) times daily.        Marland Kitchen tiotropium (SPIRIVA) 18 MCG inhalation capsule Place 18 mcg into inhaler and inhale daily.        Marland Kitchen lisinopril (PRINIVIL,ZESTRIL) 10 MG tablet Take 1 tablet (10 mg total) by mouth daily.  30 tablet  0  . metoprolol (TOPROL-XL) 100 MG 24 hr tablet Take 100 mg by mouth daily.        . potassium chloride SA (K-DUR,KLOR-CON) 20 MEQ tablet Take 2 tablets (40 mEq total) by mouth daily.  60 tablet  0   No current facility-administered medications on file prior to visit.     REVIEW OF SYSTEMS: Cardiovascular: No chest pain, chest pressure,  palpitations, orthopnea.  Positive for shortness of breath with exertion, pain in legs and walking and when lying flat. Pulmonary: No productive cough, asthma or wheezing.  Positive for home oxygen Neurologic: No weakness, paresthesias, aphasia, or amaurosis. No dizziness. Hematologic: No bleeding problems or clotting disorders. Musculoskeletal: No joint pain or joint swelling. Gastrointestinal: No blood in stool or hematemesis Genitourinary: No dysuria or hematuria. Psychiatric:: No history of major depression. Integumentary: No rashes or ulcers. Constitutional: No fever or chills.  PHYSICAL EXAMINATION: General: The patient appears their stated age.  Vital signs are BP 181/46  Pulse 54  Ht  (1.549 m)  Wt 163 lb 9.6 oz (74.208 kg)  BMI 30.93 kg/m2  SpO2 96% HEENT:  No gross abnormalities Pulmonary: Respirations are non-labored Abdomen: Soft and non-tender  Musculoskeletal: There are no major deformities.   Neurologic: No focal weakness or paresthesias are detected, Skin: There are no ulcer or rashes noted. Psychiatric: The patient has normal affect. Cardiovascular: There is a regular rate and rhythm without significant murmur appreciated.  Pedal pulses are not palpable.  Bilateral carotid bruit  Diagnostic Studies: Duplex ultrasound was ordered and reviewed today.  Ankle-brachial indices are 0.64 on the right and 0.51 on the left.  These are essentially unchanged from outside studies.  There are elevated velocities within bilateral common iliac arteries.    Assessment:  Peripheral vascular disease  Status post bilateral carotid intervention  Status post bilateral iliac stenting Plan: #1: The patient has a history of 40-59% recurrent right carotid stenosis and a widely patent left carotid stent by outside imaging.  I will have her come back for surveillance studies in 6 months  #2: The patient has elevated velocities within her iliac stents.  She has a iliac stent which  inadvertently went into the aorta which apparently is not flow-limiting.  I do not think her symptoms are related to her her decreased circulation to her legs.  Therefore, I will followup with a repeat ultrasound in 6 months.  I'll have to pay close attention to the velocity profile.  If this increases she will likely need to undergo angiography for angioplasty of in-stent stenosis.  #3: I do not think the patient's leg pain is related to vascular insufficiency.  This sounds like neuropathy  to me.  I have encouraged her to increase her Neurontin.  She tells me that she does get some relief from her Neurontin.     Jorge Ny, M.D. [facility] Office: [phone] Pager:  [phone]

## 2013-10-14 NOTE — Addendum Note (Signed)
Addended by: Sharee Pimple on: 10/14/2013 04:56 PM   Modules accepted: Orders

## 2014-02-06 ENCOUNTER — Telehealth: Payer: Self-pay | Admitting: *Deleted

## 2014-02-06 ENCOUNTER — Other Ambulatory Visit: Payer: Self-pay | Admitting: *Deleted

## 2014-02-06 DIAGNOSIS — L98499 Non-pressure chronic ulcer of skin of other sites with unspecified severity: Principal | ICD-10-CM

## 2014-02-06 DIAGNOSIS — I70209 Unspecified atherosclerosis of native arteries of extremities, unspecified extremity: Secondary | ICD-10-CM

## 2014-02-06 NOTE — Telephone Encounter (Signed)
Dr. Thomes DinningAguira's office called to get this  patient seen today. She has right foot blisters and redness up the RLE. According to them, the patient bumped her leg 2 weeks ago and has had problems since. Patient is a diabetic and was previously seen here back in September by Dr. Myra GianottiBrabham. Her ABIs were .64 on the right and .51 on the left then. We will try to work her in this week to see either Dr. Edilia Boickson or Dr. Darrick PennaFields in Dr. Coralee PesaBrabhams' absence. Per Dr. Estanislado SpireBrabham's last note states patient may need an angiogram, has history of iliac stenting.

## 2014-02-07 ENCOUNTER — Encounter: Payer: Self-pay | Admitting: Vascular Surgery

## 2014-02-08 ENCOUNTER — Ambulatory Visit (INDEPENDENT_AMBULATORY_CARE_PROVIDER_SITE_OTHER): Payer: Medicare Other | Admitting: Vascular Surgery

## 2014-02-08 ENCOUNTER — Encounter: Payer: Self-pay | Admitting: Vascular Surgery

## 2014-02-08 ENCOUNTER — Ambulatory Visit (HOSPITAL_COMMUNITY)
Admission: RE | Admit: 2014-02-08 | Discharge: 2014-02-08 | Disposition: A | Discharge status: Home / Self Care | Payer: Medicare Other | Source: Ambulatory Visit | Attending: Vascular Surgery | Admitting: Vascular Surgery

## 2014-02-08 VITALS — BP 171/46 | HR 74 | Resp 20 | Ht 61.0 in | Wt 164.0 lb

## 2014-02-08 DIAGNOSIS — I70209 Unspecified atherosclerosis of native arteries of extremities, unspecified extremity: Secondary | ICD-10-CM

## 2014-02-08 DIAGNOSIS — E785 Hyperlipidemia, unspecified: Secondary | ICD-10-CM | POA: Insufficient documentation

## 2014-02-08 DIAGNOSIS — L98499 Non-pressure chronic ulcer of skin of other sites with unspecified severity: Secondary | ICD-10-CM | POA: Diagnosis not present

## 2014-02-08 DIAGNOSIS — I739 Peripheral vascular disease, unspecified: Secondary | ICD-10-CM | POA: Diagnosis present

## 2014-02-08 DIAGNOSIS — F17200 Nicotine dependence, unspecified, uncomplicated: Secondary | ICD-10-CM | POA: Diagnosis not present

## 2014-02-08 DIAGNOSIS — I70269 Atherosclerosis of native arteries of extremities with gangrene, unspecified extremity: Secondary | ICD-10-CM

## 2014-02-08 DIAGNOSIS — I10 Essential (primary) hypertension: Secondary | ICD-10-CM | POA: Diagnosis not present

## 2014-02-08 DIAGNOSIS — I6529 Occlusion and stenosis of unspecified carotid artery: Secondary | ICD-10-CM

## 2014-02-08 NOTE — Progress Notes (Signed)
[facility]  Patient name: Belinda SeltzerCarol Werner MRN: [id number] DOB: 1934/12/29 Sex: female  REASON FOR VISIT: follow up of peripheral vascular disease.  HPI: Belinda SeltzerCarol Werner is a 79 y.o. female who was evaluated by Dr. Durene CalWells Brabham on 10/14/2013. [facility] [place] [facility] [place]. She had a left carotid stent in 2009. She had a right carotid endarterectomy in 2003. She's had iliac stenting also. She is on home O2. At the time of her visit in September she was noted to have a moderate 40-59% right recurrent carotid stenosis with a widely patent left carotid stent. She was set up for a follow up study in 6 months which would be March. She also had elevated velocities within her iliac stents and was set up for follow up studies in 6 months. She was having some leg pain at the time of this visit but it sounded like neuropathic pain.  She comes in today because she was referred with a wound on her right leg. She bumped her right leg on a chair and developed a blister on the posterior lateral aspect just above her right lateral malleolus. She has some mild claudication but her activity is really limited by her pulmonary status. She wears oxygen continuously. I do not get any history of rest pain. There are no other nonhealing ulcers on her feet.  Past Medical History  Diagnosis Date  . Hypertension   . Hypercholesterolemia   . CAD (coronary artery disease)     sig CAD previously in RCA, Cx; history of 3-4 stents and h/o CABG  . COPD (chronic obstructive pulmonary disease)   . MI (myocardial infarction)     history  . PVD (peripheral vascular disease)     refused amputation in the past   . Diastolic dysfunction     echo 0/86578/2010 EF 55%, mild to mod LVH, LA mild to mod dilated  . Pneumonia    Family History  Problem Relation Age of Onset  . Diabetes Mother   . Varicose Veins Mother   . Cancer Father   . Cancer Sister   .  Diabetes Daughter   . Heart disease Daughter   . Hypertension Daughter   . Heart attack Daughter     before age 77  . Heart disease Son   . Hypertension Son   . Heart attack Son     before age 77   SOCIAL HISTORY: History  Substance Use Topics  . Smoking status: Current Every Day Smoker -- 0.50 packs/day for 50 years    Types: Cigarettes  . Smokeless tobacco: Never Used  . Alcohol Use: No   Allergies  Allergen Reactions  . Penicillins Hives  . Avelox [Moxifloxacin Hcl In Nacl]     hives   Current Outpatient Prescriptions  Medication Sig Dispense Refill  . aspirin 325 MG tablet Take 325 mg by mouth daily.      Marland Kitchen. atorvastatin (LIPITOR) 80 MG tablet Take 80 mg by mouth daily.      . clopidogrel (PLAVIX) 75 MG tablet Take 75 mg by mouth daily.      . famotidine (PEPCID) 20 MG tablet Take 20 mg by mouth 2 (two) times daily.      . fluticasone (FLONASE) 50 MCG/ACT nasal spray 1 spray by Each Nare route daily.    . fluticasone-salmeterol (ADVAIR HFA) 115-21 MCG/ACT inhaler Inhale 2 puffs into the lungs 2 (two) times daily.      .Marland Kitchen  furosemide (LASIX) 40 MG tablet Take 40 mg by mouth 2 (two) times daily.    Marland Kitchen gabapentin (NEURONTIN) 100 MG capsule Take 100 mg by mouth 3 (three) times daily.    . isosorbide dinitrate (ISORDIL) 30 MG tablet Take 30 mg by mouth 4 (four) times daily.    Marland Kitchen lisinopril (PRINIVIL,ZESTRIL) 10 MG tablet Take 1 tablet (10 mg total) by mouth daily. 30 tablet 0  . losartan (COZAAR) 50 MG tablet Take 50 mg by mouth daily.    . nebivolol (BYSTOLIC) 10 MG tablet Take 10 mg by mouth daily.    . potassium chloride SA (K-DUR,KLOR-CON) 20 MEQ tablet Take 2 tablets (40 mEq total) by mouth daily. 60 tablet 0  . tiotropium (SPIRIVA) 18 MCG inhalation capsule Place 18 mcg into inhaler and inhale daily.      . metoprolol (TOPROL-XL) 100 MG 24 hr tablet Take 100 mg by mouth daily.       No current facility-administered medications for this visit.   REVIEW OF SYSTEMS: Arly.Keller ]  denotes positive finding; [  ] denotes negative finding  CARDIOVASCULAR:  [ ]  chest pain   [ ]  chest pressure   [ ]  palpitations   [ ]  orthopnea   [ ]  dyspnea on exertion   Arly.Keller ] claudication   [ ]  rest pain   [ ]  DVT   [ ]  phlebitis PULMONARY:   [ ]  productive cough   [ ]  asthma   [ ]  wheezing NEUROLOGIC:   [ ]  weakness  [ ]  paresthesias  [ ]  aphasia  [ ]  amaurosis  [ ]  dizziness HEMATOLOGIC:   [ ]  bleeding problems   [ ]  clotting disorders MUSCULOSKELETAL:  [ ]  joint pain   [ ]  joint swelling [ ]  leg swelling GASTROINTESTINAL: [ ]   blood in stool  [ ]   hematemesis GENITOURINARY:  [ ]   dysuria  [ ]   hematuria PSYCHIATRIC:  [ ]  history of major depression INTEGUMENTARY:  [ ]  rashes  [ ]  ulcers CONSTITUTIONAL:  [ ]  fever   [ ]  chills  PHYSICAL EXAM: Filed Vitals:   02/08/14 0952  BP: 171/46  Pulse: 74  Resp: 20  Height: 5\' 1"  (1.549 m)  Weight: 164 lb (74.39 kg)   Body mass index is 31 kg/(m^2). GENERAL: The patient is a well-nourished female, in no acute distress. The vital signs are documented above. CARDIOVASCULAR: There is a regular rate and rhythm. She has bilateral carotid bruits. She has diminished femoral pulses bilaterally. I cannot palpate pedal pulses bilaterally. PULMONARY: There is good air exchange bilaterally without wheezing or rales. ABDOMEN: Soft and non-tender with normal pitched bowel sounds.  MUSCULOSKELETAL: There are no major deformities or cyanosis. NEUROLOGIC: No focal weakness or paresthesias are detected. SKIN: She has a blister on the posterior lateral aspect of her right leg just above her lateral malleolus. As no erythema or drainage to suggest infection. There are no wounds on her feet. PSYCHIATRIC: The patient has a normal affect.  DATA:  I have independently interpreted her arterial Doppler study today which shows monophasic Doppler signals in the right foot and the dorsalis pedis and posterior tibial positions. An ABI could not be obtained because of a  blister on the right ankle. Toe pressure on the right was 63 mmHg. ABI on the left was 41% with monophasic Doppler signals in the posterior tibial and dorsalis pedis positions. Toe pressure on the left was 55 mmHg.  MEDICAL ISSUES: PERIPHERAL VASCULAR DISEASE: The  patient has had previous iliac stents and Dr. Myra GianottiBrabham is following some mild recurrent disease. She is due for follow up studies in March. She comes in today because of a blister on her right leg but I think this will heal without any difficulty. Toe pressure on the right is 63 mmHg which suggest adequate circulation for healing. She'll keep her appointment in March with Dr. Myra GianottiBrabham. She knows to call sooner if she has problems.    DICKSON,CHRISTOPHER S [facility] [place]: [phone]

## 2014-04-12 ENCOUNTER — Other Ambulatory Visit: Payer: Self-pay | Admitting: *Deleted

## 2014-04-12 DIAGNOSIS — I739 Peripheral vascular disease, unspecified: Secondary | ICD-10-CM

## 2014-04-12 DIAGNOSIS — Z48812 Encounter for surgical aftercare following surgery on the circulatory system: Secondary | ICD-10-CM

## 2014-04-21 ENCOUNTER — Encounter: Payer: Self-pay | Admitting: Surgery

## 2014-04-24 ENCOUNTER — Ambulatory Visit (INDEPENDENT_AMBULATORY_CARE_PROVIDER_SITE_OTHER): Payer: Medicare Other | Admitting: Surgery

## 2014-04-24 ENCOUNTER — Ambulatory Visit (INDEPENDENT_AMBULATORY_CARE_PROVIDER_SITE_OTHER)
Admission: RE | Admit: 2014-04-24 | Discharge: 2014-04-24 | Disposition: A | Discharge status: Home / Self Care | Payer: Medicare Other | Source: Ambulatory Visit | Attending: Surgery | Admitting: Surgery

## 2014-04-24 ENCOUNTER — Other Ambulatory Visit: Payer: Self-pay | Admitting: *Deleted

## 2014-04-24 ENCOUNTER — Ambulatory Visit (HOSPITAL_COMMUNITY)
Admission: RE | Admit: 2014-04-24 | Discharge: 2014-04-24 | Disposition: A | Discharge status: Home / Self Care | Payer: Medicare Other | Source: Ambulatory Visit | Attending: Surgery | Admitting: Surgery

## 2014-04-24 ENCOUNTER — Encounter: Payer: Self-pay | Admitting: Surgery

## 2014-04-24 VITALS — BP 172/54 | HR 56 | Ht 61.0 in | Wt 160.0 lb

## 2014-04-24 DIAGNOSIS — I6523 Occlusion and stenosis of bilateral carotid arteries: Secondary | ICD-10-CM | POA: Diagnosis not present

## 2014-04-24 DIAGNOSIS — I739 Peripheral vascular disease, unspecified: Secondary | ICD-10-CM

## 2014-04-24 DIAGNOSIS — Z48812 Encounter for surgical aftercare following surgery on the circulatory system: Secondary | ICD-10-CM

## 2014-04-24 DIAGNOSIS — I70269 Atherosclerosis of native arteries of extremities with gangrene, unspecified extremity: Secondary | ICD-10-CM

## 2014-04-24 NOTE — Progress Notes (Signed)
Patient name: Belinda Werner MRN: [id number] DOB: 07-07-1934 Sex: female     Chief Complaint  Patient presents with  . Re-evaluation    6 month f/u - PVD/carotid    HISTORY OF PRESENT ILLNESS: The patient is back for routine follow-up.  In the interval since I last saw her she saw Dr. Edilia Bo for wounds on her legs.  She has an extensive vascular history, all done at [facility].She is status post left carotid stenting in 2009. She had a right carotid endarterectomy in 2003. She has had iliac stenting performed as well with repeat dilatation. She has a history of coronary artery disease and has had multiple percutaneous stents.  She reports no changes in her leg symptoms.  She denies any neurologic symptoms.  She does state that she has a dog bite on her right leg which has been there for about a week  Past Medical History  Diagnosis Date  . Hypertension   . Hypercholesterolemia   . CAD (coronary artery disease)     sig CAD previously in RCA, Cx; history of 3-4 stents and h/o CABG  . COPD (chronic obstructive pulmonary disease)   . MI (myocardial infarction)     history  . PVD (peripheral vascular disease)     refused amputation in the past   . Diastolic dysfunction     echo 05/979 EF 55%, mild to mod LVH, LA mild to mod dilated  . Pneumonia     Past Surgical History  Procedure Laterality Date  . Coronary artery bypass graft    . Abdominal hysterectomy    . Tonsillectomy    . Cholecystectomy    . Appendectomy    . Other surgical history      ?heart valve replacement    History   Social History  . Marital Status: Divorced    Spouse Name: N/A  . Number of Children: N/A  . Years of Education: N/A   Occupational History  . Not on file.   Social History Main Topics  . Smoking status: Current Every Day Smoker -- 0.50 packs/day for 50 years    Types: Cigarettes  . Smokeless tobacco: Never Used  . Alcohol Use: No  . Drug Use: No  . Sexual Activity: No    Other Topics Concern  . Not on file   Social History Narrative   Smoking since age 64 y.o    Family History  Problem Relation Age of Onset  . Diabetes Mother   . Varicose Veins Mother   . Cancer Father   . Cancer Sister   . Diabetes Daughter   . Heart disease Daughter   . Hypertension Daughter   . Heart attack Daughter     before age 59  . Heart disease Son   . Hypertension Son   . Heart attack Son     before age 75    Allergies as of 04/24/2014 - Review Complete 04/24/2014  Allergen Reaction Noted  . Penicillins Hives 08/29/2010  . Avelox [moxifloxacin hcl in nacl]  07/10/2012    Current Outpatient Prescriptions on File Prior to Visit  Medication Sig Dispense Refill  . aspirin 325 MG tablet Take 325 mg by mouth daily.      Marland Kitchen atorvastatin (LIPITOR) 80 MG tablet Take 80 mg by mouth daily.      . clopidogrel (PLAVIX) 75 MG tablet Take 75 mg by mouth daily.      . famotidine (PEPCID) 20 MG tablet Take  20 mg by mouth 2 (two) times daily.      . fluticasone (FLONASE) 50 MCG/ACT nasal spray 1 spray by Each Nare route daily.    . fluticasone-salmeterol (ADVAIR HFA) 115-21 MCG/ACT inhaler Inhale 2 puffs into the lungs 2 (two) times daily.      . furosemide (LASIX) 40 MG tablet Take 40 mg by mouth 2 (two) times daily.    Marland Kitchen. gabapentin (NEURONTIN) 100 MG capsule Take 100 mg by mouth 3 (three) times daily.    . isosorbide dinitrate (ISORDIL) 30 MG tablet Take 30 mg by mouth 4 (four) times daily.    Marland Kitchen. lisinopril (PRINIVIL,ZESTRIL) 10 MG tablet Take 1 tablet (10 mg total) by mouth daily. 30 tablet 0  . losartan (COZAAR) 50 MG tablet Take 50 mg by mouth daily.    . metoprolol (TOPROL-XL) 100 MG 24 hr tablet Take 100 mg by mouth daily.      . nebivolol (BYSTOLIC) 10 MG tablet Take 10 mg by mouth daily.    . potassium chloride SA (K-DUR,KLOR-CON) 20 MEQ tablet Take 2 tablets (40 mEq total) by mouth daily. 60 tablet 0  . tiotropium (SPIRIVA) 18 MCG inhalation capsule Place 18 mcg into  inhaler and inhale daily.       No current facility-administered medications on file prior to visit.     REVIEW OF SYSTEMS: Cardiovascular: No chest pain, chest pressure, palpitations, orthopnea, or dyspnea on exertion. claudication   No history of DVT or phlebitis. Pulmonary: home O2. Neurologic: No weakness, paresthesias, aphasia, or amaurosis. No dizziness. Hematologic: No bleeding problems or clotting disorders. Musculoskeletal: No joint pain or joint swelling. Gastrointestinal: No blood in stool or hematemesis Genitourinary: No dysuria or hematuria. Psychiatric:: No history of major depression. Integumentary: Dog bite right leg. Constitutional: No fever or chills.  PHYSICAL EXAMINATION:   Vital signs are BP 172/54 mmHg  Pulse 56  Ht 5\' 1"  (1.549 m)  Wt 160 lb (72.576 kg)  BMI 30.25 kg/m2  SpO2 92% General: The patient appears their stated age. HEENT:  No gross abnormalities Pulmonary:  Non labored breathing Abdomen: Soft and non-tender Musculoskeletal: There are no major deformities. Neurologic: No focal weakness or paresthesias are detected, Skin: approximately 3 x 2 cm area from a dog bite which does appear to be healing with no erythema  Psychiatric: The patient has normal affect. Cardiovascular: nonpalpable pedal pulses and no carotid bruits  Diagnostic Studies Carotid Doppler studies: Patent right carotid endarterectomy with velocities revealing greater than 50% common carotid artery stenosis, and less than 40% internal carotid stenosis.  The left carotid stent shows less than 40% stenosis.  Aortoiliac: Patent external iliac arteries iliac stents could not be visualized.  Previous elevation in velocity in bilateral common and external iliac arteries could not be adequately duplicated today due to visual limitations   Assessment:  1: Peripheral vascular disease  2.carotid stenosis  Plan:   1: The patient does not have significant change in her symptoms.  She  appears to be healing the dog bite wound on her right leg.  Ultrasound today suggests stenosis in her inflow, however this was not adequately visualized.  Given that she remains essentially symptom-free I will repeat this study in 1 year or sooner if she does not heal her wound adequately  2:  Bilateral carotid arteries are patent.  Will follow-up with repeat ultrasound in one year    V. Charlena CrossWells Ruel Dimmick IV, M.D. [facility] Office: [phone] Pager:  [phone]

## 2014-07-27 IMAGING — CR DG CHEST 2V
2 series · 2 of 2 positions shown · non-contrast
Comparison: 08/29/2010

CLINICAL DATA: Shortness of breath

CHEST - 2 VIEW

[w chest lat]
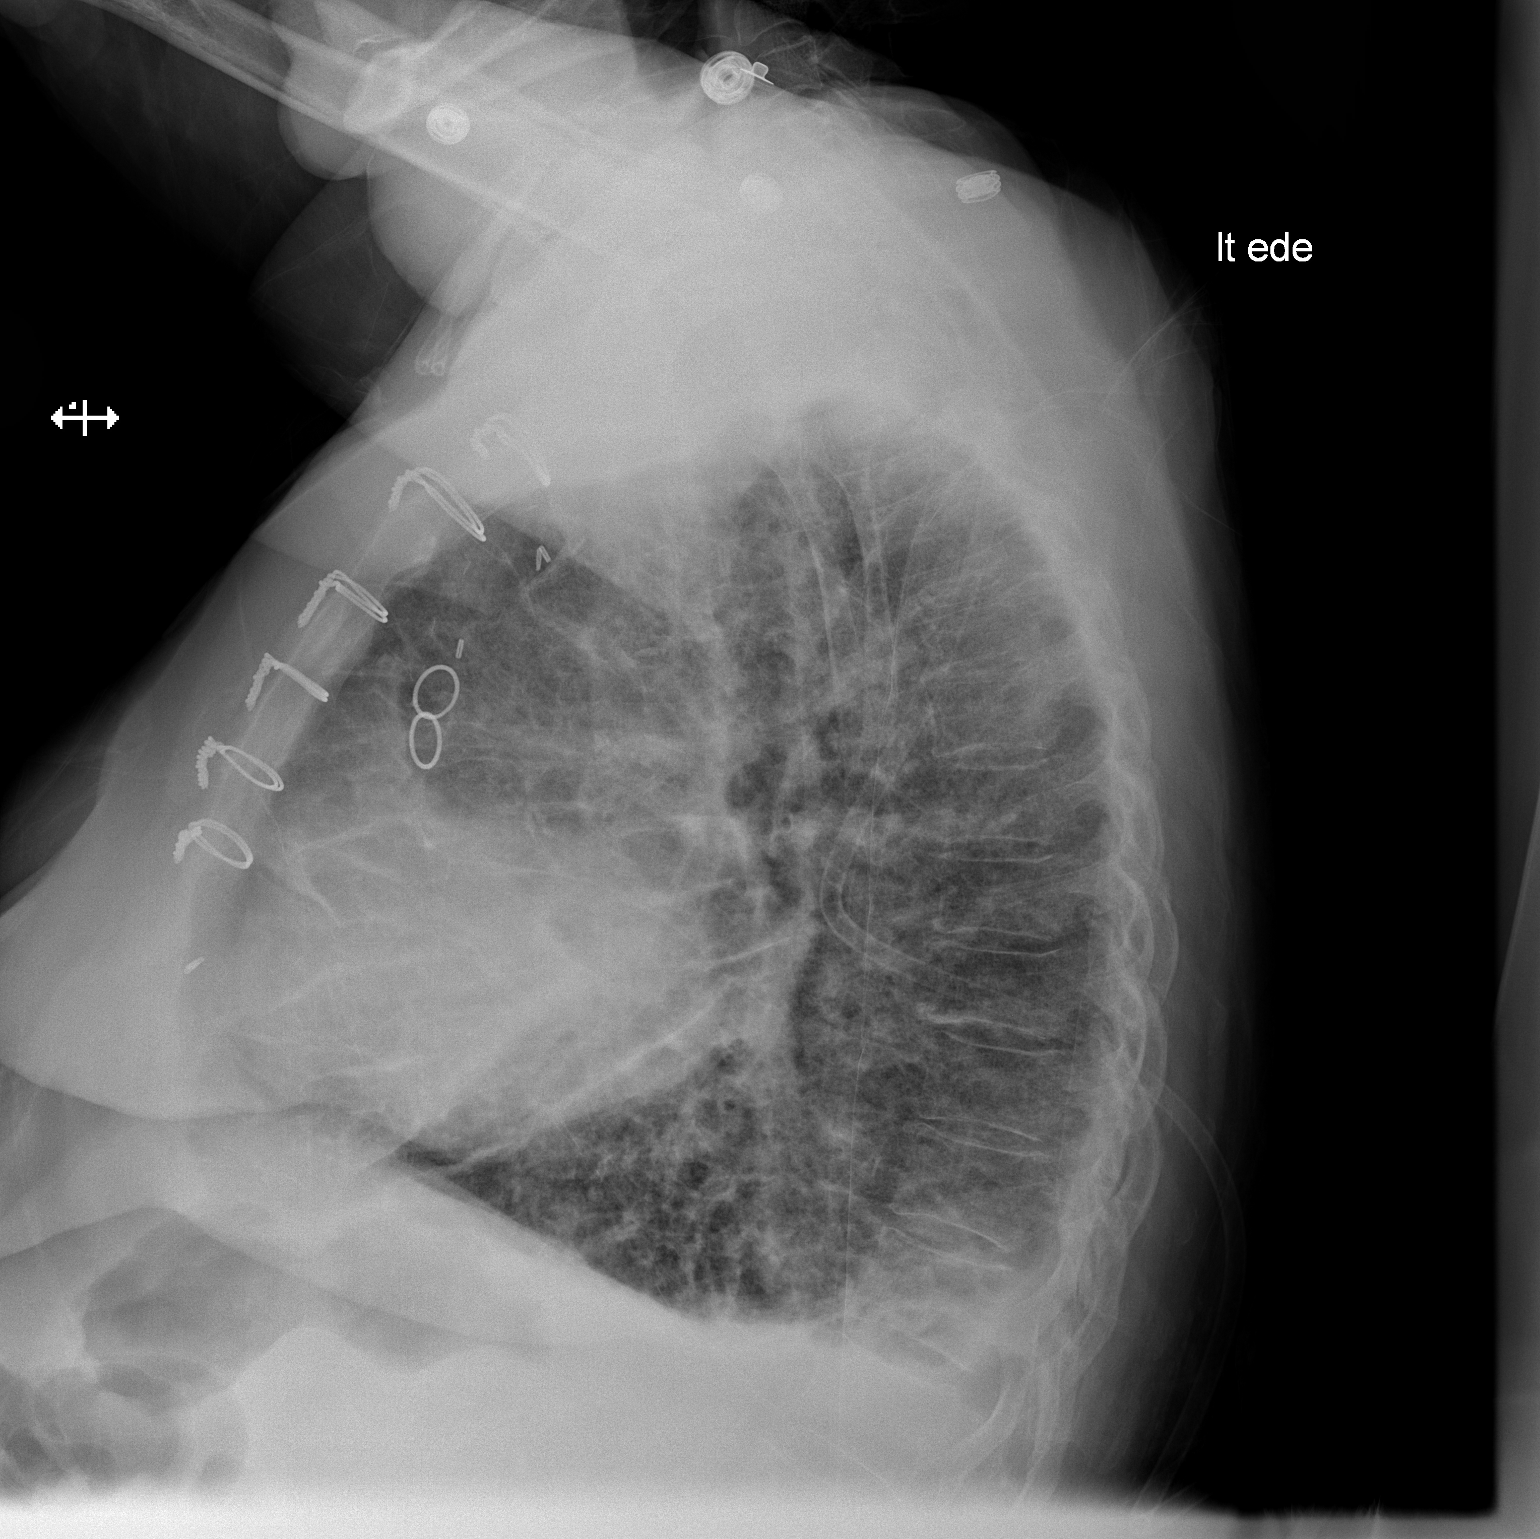

[w chest decub]
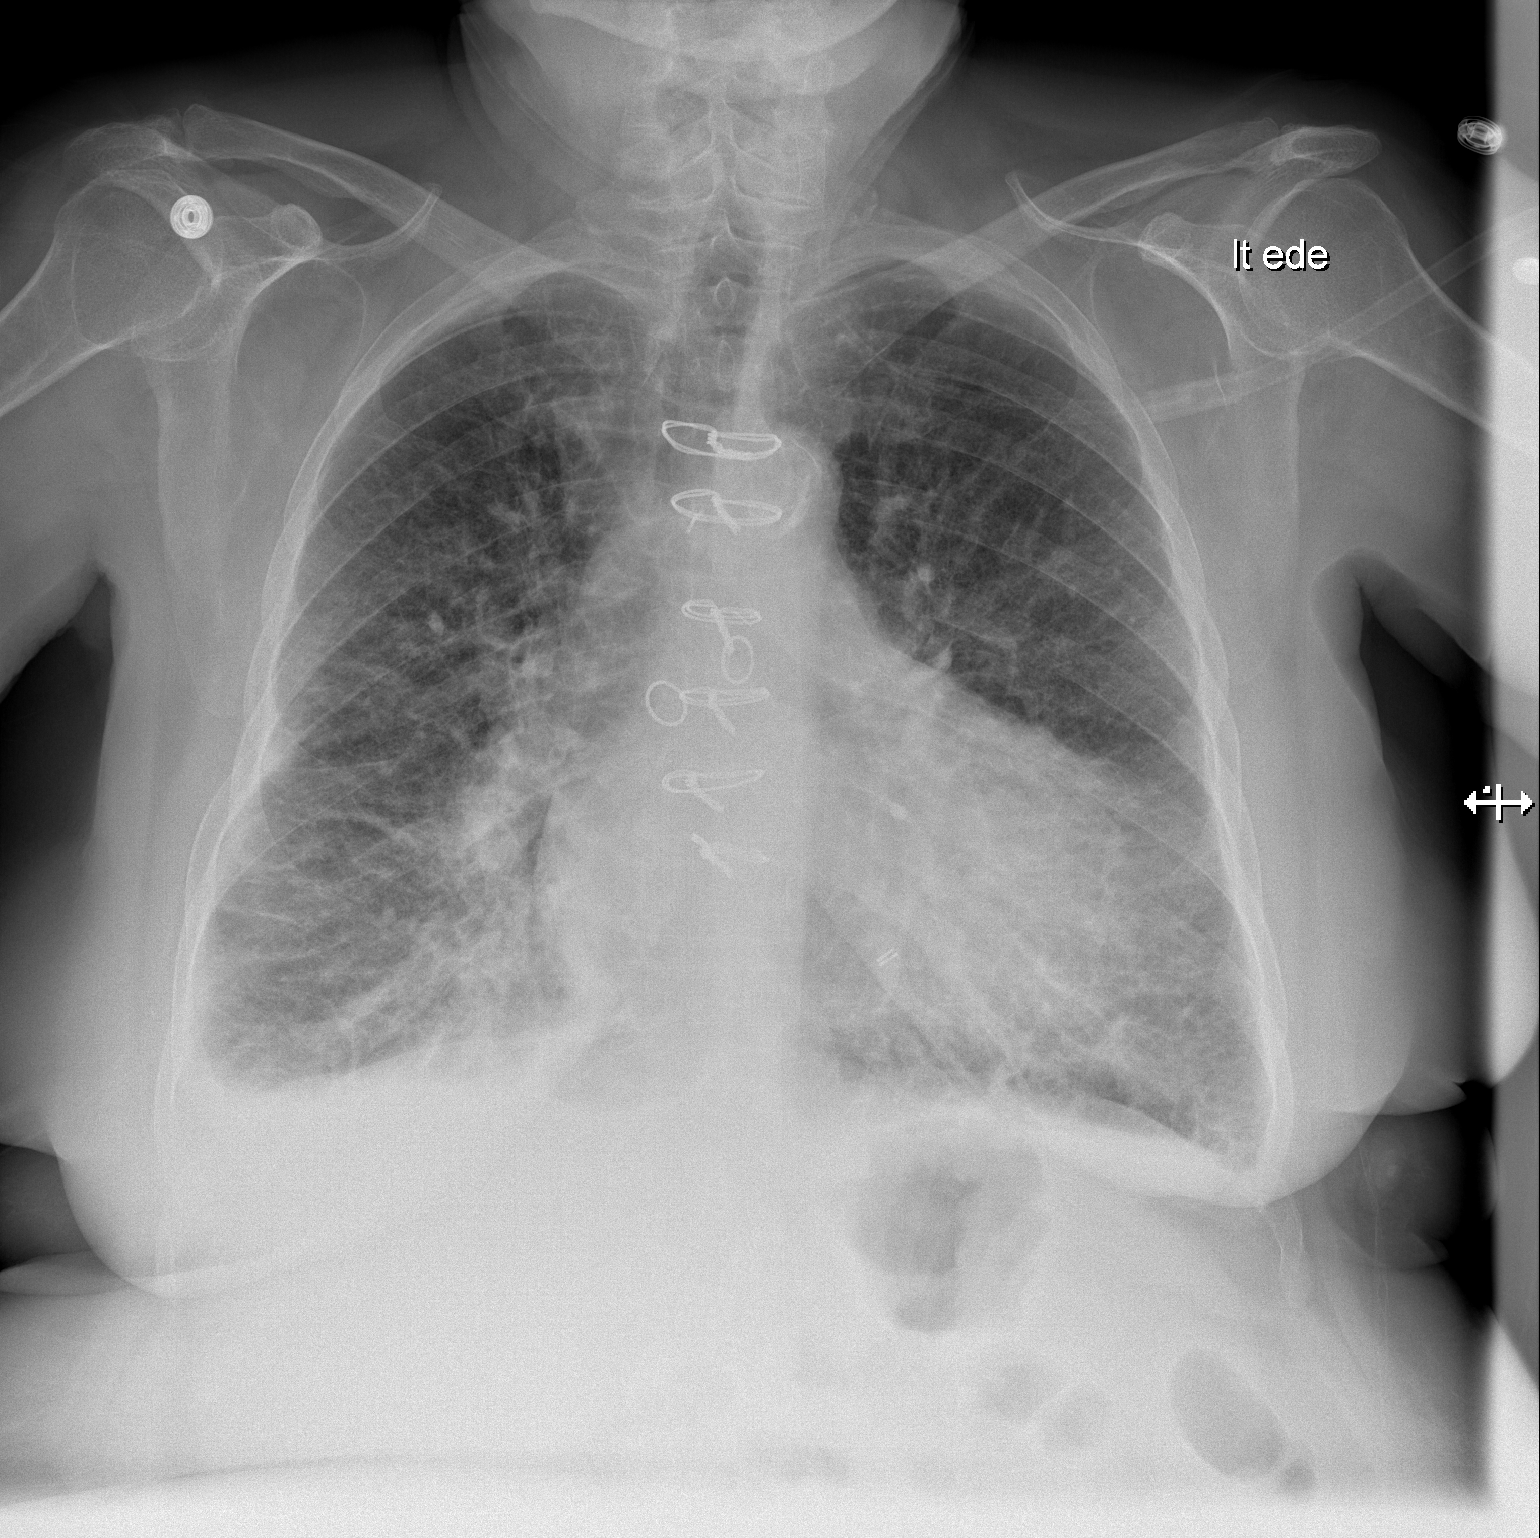

[2 of 2 positions shown; findings below may reference images not displayed]

FINDINGS: Prior coronary bypass changes.  Heart is enlarged with
symmetric diffuse interstitial opacities and small effusions
compatible with interstitial edema.  Findings consistent with CHF.
Atherosclerosis of the aorta.  Trachea is midline.  No
pneumothorax.
IMPRESSION: CHF.

## 2014-07-31 ENCOUNTER — Encounter (HOSPITAL_COMMUNITY): Payer: Self-pay | Admitting: *Deleted

## 2014-07-31 ENCOUNTER — Emergency Department (HOSPITAL_COMMUNITY): Payer: Medicare Other

## 2014-07-31 ENCOUNTER — Emergency Department (HOSPITAL_COMMUNITY)
Admission: EM | Admit: 2014-07-31 | Discharge: 2014-07-31 | Disposition: A | Discharge status: Home / Self Care | Payer: Medicare Other | Attending: Emergency Medicine | Admitting: Emergency Medicine

## 2014-07-31 DIAGNOSIS — Z720 Tobacco use: Secondary | ICD-10-CM | POA: Insufficient documentation

## 2014-07-31 DIAGNOSIS — I10 Essential (primary) hypertension: Secondary | ICD-10-CM | POA: Insufficient documentation

## 2014-07-31 DIAGNOSIS — Z7902 Long term (current) use of antithrombotics/antiplatelets: Secondary | ICD-10-CM | POA: Insufficient documentation

## 2014-07-31 DIAGNOSIS — Z7951 Long term (current) use of inhaled steroids: Secondary | ICD-10-CM | POA: Diagnosis not present

## 2014-07-31 DIAGNOSIS — I252 Old myocardial infarction: Secondary | ICD-10-CM | POA: Diagnosis not present

## 2014-07-31 DIAGNOSIS — I2510 Atherosclerotic heart disease of native coronary artery without angina pectoris: Secondary | ICD-10-CM | POA: Diagnosis not present

## 2014-07-31 DIAGNOSIS — Z880 Allergy status to penicillin: Secondary | ICD-10-CM | POA: Diagnosis not present

## 2014-07-31 DIAGNOSIS — Z951 Presence of aortocoronary bypass graft: Secondary | ICD-10-CM | POA: Insufficient documentation

## 2014-07-31 DIAGNOSIS — Z7982 Long term (current) use of aspirin: Secondary | ICD-10-CM | POA: Diagnosis not present

## 2014-07-31 DIAGNOSIS — E780 Pure hypercholesterolemia: Secondary | ICD-10-CM | POA: Insufficient documentation

## 2014-07-31 DIAGNOSIS — Z79899 Other long term (current) drug therapy: Secondary | ICD-10-CM | POA: Insufficient documentation

## 2014-07-31 DIAGNOSIS — J189 Pneumonia, unspecified organism: Secondary | ICD-10-CM

## 2014-07-31 DIAGNOSIS — J159 Unspecified bacterial pneumonia: Secondary | ICD-10-CM | POA: Diagnosis not present

## 2014-07-31 DIAGNOSIS — R0602 Shortness of breath: Secondary | ICD-10-CM | POA: Diagnosis present

## 2014-07-31 DIAGNOSIS — Z8701 Personal history of pneumonia (recurrent): Secondary | ICD-10-CM | POA: Insufficient documentation

## 2014-07-31 DIAGNOSIS — Z9861 Coronary angioplasty status: Secondary | ICD-10-CM | POA: Insufficient documentation

## 2014-07-31 DIAGNOSIS — J441 Chronic obstructive pulmonary disease with (acute) exacerbation: Secondary | ICD-10-CM | POA: Diagnosis not present

## 2014-07-31 LAB — I-STAT TROPONIN, ED: TROPONIN I, POC: 0.05 ng/mL (ref 0.00–0.08)

## 2014-07-31 LAB — BASIC METABOLIC PANEL
Anion gap: 10 (ref 5–15)
BUN: 22 mg/dL — AB (ref 6–20)
CALCIUM: 8.8 mg/dL — AB (ref 8.9–10.3)
CO2: 25 mmol/L (ref 22–32)
CREATININE: 0.87 mg/dL (ref 0.44–1.00)
Chloride: 104 mmol/L (ref 101–111)
GFR calc Af Amer: >60 mL/min (ref >60)
GLUCOSE: 112 mg/dL — AB (ref 65–99)
Potassium: 3.5 mmol/L (ref 3.5–5.1)
Sodium: 139 mmol/L (ref 135–145)

## 2014-07-31 LAB — BRAIN NATRIURETIC PEPTIDE: B NATRIURETIC PEPTIDE 5: 375 pg/mL — AB (ref 0.0–100.0)

## 2014-07-31 LAB — CBC WITH DIFFERENTIAL/PLATELET
Basophils Absolute: 0 10*3/uL (ref 0.0–0.1)
Basophils Relative: 0 % (ref 0–1)
Eosinophils Absolute: 0.2 10*3/uL (ref 0.0–0.7)
Eosinophils Relative: 2 % (ref 0–5)
HCT: 31.4 % — ABNORMAL LOW (ref 36.0–46.0)
HEMOGLOBIN: 10 g/dL — AB (ref 12.0–15.0)
Lymphocytes Relative: 7 % — ABNORMAL LOW (ref 12–46)
Lymphs Abs: 0.8 10*3/uL (ref 0.7–4.0)
MCH: 32.2 pg (ref 26.0–34.0)
MCHC: 31.8 g/dL (ref 30.0–36.0)
MCV: 101 fL — ABNORMAL HIGH (ref 78.0–100.0)
MONOS PCT: 10 % (ref 3–12)
Monocytes Absolute: 1.1 10*3/uL — ABNORMAL HIGH (ref 0.1–1.0)
NEUTROS ABS: 8.9 10*3/uL — AB (ref 1.7–7.7)
NEUTROS PCT: 81 % — AB (ref 43–77)
PLATELETS: 185 10*3/uL (ref 150–400)
RBC: 3.11 MIL/uL — ABNORMAL LOW (ref 3.87–5.11)
RDW: 14.2 % (ref 11.5–15.5)
WBC: 10.9 10*3/uL — ABNORMAL HIGH (ref 4.0–10.5)

## 2014-07-31 MED ORDER — DOXYCYCLINE HYCLATE 100 MG PO TABS
100.0000 mg | ORAL_TABLET | Freq: Once | ORAL | Status: AC
  Administered 2014-07-31: 100 mg via ORAL
  Filled 2014-07-31: qty 1

## 2014-07-31 MED ORDER — DOXYCYCLINE HYCLATE 100 MG PO CAPS: 100.0000 mg | ORAL_CAPSULE | Freq: Two times a day (BID) | ORAL | Status: DC

## 2014-07-31 MED ORDER — LIDOCAINE HCL (PF) 1 % IJ SOLN
0.9000 mL | Freq: Once | INTRAMUSCULAR | Status: AC
  Administered 2014-07-31: 0.9 mL via INTRADERMAL

## 2014-07-31 MED ORDER — CEFTRIAXONE SODIUM 1 G IJ SOLR
1.0000 g | Freq: Once | INTRAMUSCULAR | Status: AC
  Administered 2014-07-31: 1 g via INTRAMUSCULAR
  Filled 2014-07-31: qty 10

## 2014-07-31 MED ORDER — LIDOCAINE HCL (PF) 1 % IJ SOLN
INTRAMUSCULAR | Status: AC
  Filled 2014-07-31: qty 5

## 2014-07-31 MED ORDER — ACETAMINOPHEN 325 MG PO TABS
650.0000 mg | ORAL_TABLET | Freq: Once | ORAL | Status: AC
  Administered 2014-07-31: 650 mg via ORAL
  Filled 2014-07-31: qty 2

## 2014-07-31 NOTE — ED Notes (Signed)
Pt observed ambulating in room while dressing.  Gait steady and even.

## 2014-07-31 NOTE — Discharge Instructions (Signed)

## 2014-07-31 NOTE — ED Notes (Signed)
Pt states that she has had increased SOB and chest pain for several days. Pt states that she has had chills as well. Pt has increased her O2 from 2L to 3L as well.

## 2014-07-31 NOTE — ED Notes (Signed)
PA at bedside aware of updated BP.

## 2014-07-31 NOTE — ED Notes (Signed)
  Pt on 2L o2 stats sitting 86%-91%, walking with 2L o2 88%-91%, sitting with 3L 93%, walking with 3L 88%-92%

## 2014-07-31 NOTE — ED Provider Notes (Signed)
CSN: [id number]     Arrival date & time 07/31/14  0940 History   First MD Initiated Contact with Patient 07/31/14 [phone]     Chief Complaint  Patient presents with  . Shortness of Breath   Belinda Werner is a 80 y.o. female with a history of hypertension, coronary artery disease, COPD, MI, peripheral vascular disease, and diastolic dysfunction who presents to the emergency department complaining of chest heaviness, sore throat, cough and chills ongoing for the past 3-4 days. The patient reports her chest heaviness is intermittent and she denies chest pain or chest tightness. Patient reports she's had a cough for the past 4 days that is not productive. She reports that she is had slight increased shortness of breath over the past 4 days and increased her oxygen at home from 2 L to 3 L via nasal cannula this morning. While laying in bed, during my interview, she denies feeling short of breath. Patient also reports feeling fatigued for the past 3-4 days and reports she has been taking care of her 39 year old mother at home and this is been stressful. The patient denies fevers, wheezing, chest pain, palpitations, rashes, pain, nausea, vomiting, lightheadedness, dizziness, weakness, numbness, tingling, urinary symptoms, or headache.  (Consider location/radiation/quality/duration/timing/severity/associated sxs/prior Treatment) HPI  Past Medical History  Diagnosis Date  . Hypertension   . Hypercholesterolemia   . CAD (coronary artery disease)     sig CAD previously in RCA, Cx; history of 3-4 stents and h/o CABG  . COPD (chronic obstructive pulmonary disease)   . MI (myocardial infarction)     history  . PVD (peripheral vascular disease)     refused amputation in the past   . Diastolic dysfunction     echo 02/1912 EF 55%, mild to mod LVH, LA mild to mod dilated  . Pneumonia    Past Surgical History  Procedure Laterality Date  . Coronary artery bypass graft    . Abdominal hysterectomy    .  Tonsillectomy    . Cholecystectomy    . Appendectomy    . Other surgical history      ?heart valve replacement   Family History  Problem Relation Age of Onset  . Diabetes Mother   . Varicose Veins Mother   . Cancer Father   . Cancer Sister   . Diabetes Daughter   . Heart disease Daughter   . Hypertension Daughter   . Heart attack Daughter     before age 39  . Heart disease Son   . Hypertension Son   . Heart attack Son     before age 29   History  Substance Use Topics  . Smoking status: Current Every Day Smoker -- 0.50 packs/day for 50 years    Types: Cigarettes  . Smokeless tobacco: Never Used  . Alcohol Use: No   OB History    No data available     Review of Systems  Constitutional: Positive for chills and fatigue. Negative for fever.  HENT: Positive for sore throat. Negative for congestion and trouble swallowing.   Eyes: Negative for visual disturbance.  Respiratory: Positive for cough and shortness of breath. Negative for wheezing.   Cardiovascular: Negative for chest pain, palpitations and leg swelling.  Gastrointestinal: Negative for nausea, vomiting, abdominal pain and diarrhea.  Genitourinary: Negative for dysuria, frequency, hematuria, decreased urine volume and difficulty urinating.  Musculoskeletal: Negative for back pain and neck pain.  Skin: Negative for rash.  Neurological: Negative for dizziness, syncope, weakness, light-headedness, numbness  and headaches.      Allergies  Penicillins and Avelox  Home Medications   Prior to Admission medications   Medication Sig Start Date End Date Taking? Authorizing Provider  aspirin 325 MG tablet Take 325 mg by mouth daily.     Yes Historical Provider, MD  atorvastatin (LIPITOR) 80 MG tablet Take 80 mg by mouth daily.     Yes Historical Provider, MD  clopidogrel (PLAVIX) 75 MG tablet Take 75 mg by mouth daily.     Yes Historical Provider, MD  famotidine (PEPCID) 20 MG tablet Take 20 mg by mouth 2 (two) times  daily.     Yes Historical Provider, MD  fluticasone (FLONASE) 50 MCG/ACT nasal spray 1 spray by Each Nare route daily. 07/22/13  Yes Historical Provider, MD  fluticasone-salmeterol (ADVAIR HFA) 115-21 MCG/ACT inhaler Inhale 2 puffs into the lungs 2 (two) times daily.     Yes Historical Provider, MD  furosemide (LASIX) 40 MG tablet Take 40 mg by mouth 2 (two) times daily. 07/11/12  Yes Lollie Sails, MD  gabapentin (NEURONTIN) 100 MG capsule Take 100 mg by mouth 3 (three) times daily. 07/22/13  Yes Historical Provider, MD  isosorbide dinitrate (ISORDIL) 30 MG tablet Take 30 mg by mouth 4 (four) times daily.   Yes Historical Provider, MD  lisinopril (PRINIVIL,ZESTRIL) 10 MG tablet Take 1 tablet (10 mg total) by mouth daily. 07/11/12  Yes Lollie Sails, MD  losartan (COZAAR) 50 MG tablet Take 50 mg by mouth daily.   Yes Historical Provider, MD  metoprolol (TOPROL-XL) 100 MG 24 hr tablet Take 100 mg by mouth daily.     Yes Historical Provider, MD  nebivolol (BYSTOLIC) 10 MG tablet Take 10 mg by mouth daily.   Yes Historical Provider, MD  potassium chloride SA (K-DUR,KLOR-CON) 20 MEQ tablet Take 2 tablets (40 mEq total) by mouth daily. 07/11/12  Yes Lollie Sails, MD  PROVENTIL HFA 108 (90 BASE) MCG/ACT inhaler Inhale 1 puff into the lungs every 4 (four) hours as needed. wheezing 05/29/14  Yes Historical Provider, MD  tiotropium (SPIRIVA) 18 MCG inhalation capsule Place 18 mcg into inhaler and inhale daily.     Yes Historical Provider, MD  doxycycline (VIBRAMYCIN) 100 MG capsule Take 1 capsule (100 mg total) by mouth 2 (two) times daily. 07/31/14   Everlene Farrier, PA-C   BP 140/34 mmHg  Pulse 64  Temp(Src) 99.8 F (37.7 C) (Oral)  Resp 20  SpO2 99% Physical Exam  Constitutional: She appears well-developed and well-nourished. No distress.  Nontoxic appearing.  HENT:  Head: Normocephalic and atraumatic.  Right Ear: External ear normal.  Left Ear: External ear normal.  Mouth/Throat: Oropharynx is  clear and moist. No oropharyngeal exudate.  Eyes: Conjunctivae are normal. Pupils are equal, round, and reactive to light. Right eye exhibits no discharge. Left eye exhibits no discharge.  Neck: Normal range of motion. Neck supple. No JVD present. No tracheal deviation present.  Cardiovascular: Normal rate, regular rhythm, normal heart sounds and intact distal pulses.  Exam reveals no gallop and no friction rub.   Bilateral radial pulses are intact. Bilateral dorsalis pedis pulses are intact.  Pulmonary/Chest: Effort normal. No respiratory distress. She has no wheezes. She has rales.  Rales to right base.   Abdominal: Soft. She exhibits no distension. There is no tenderness. There is no rebound and no guarding.  Musculoskeletal: She exhibits no edema.  No lower extremity edema.  Lymphadenopathy:    She has no cervical adenopathy.  Neurological: She is alert. Coordination normal.  Skin: Skin is warm and dry. No rash noted. She is not diaphoretic. No erythema. No pallor.  Psychiatric: She has a normal mood and affect. Her behavior is normal.  Nursing note and vitals reviewed.   ED Course  Procedures (including critical care time) Labs Review Labs Reviewed  BASIC METABOLIC PANEL - Abnormal; Notable for the following:    Glucose, Bld 112 (*)    BUN 22 (*)    Calcium 8.8 (*)    All other components within normal limits  CBC WITH DIFFERENTIAL/PLATELET - Abnormal; Notable for the following:    WBC 10.9 (*)    RBC 3.11 (*)    Hemoglobin 10.0 (*)    HCT 31.4 (*)    MCV 101.0 (*)    Neutrophils Relative % 81 (*)    Neutro Abs 8.9 (*)    Lymphocytes Relative 7 (*)    Monocytes Absolute 1.1 (*)    All other components within normal limits  BRAIN NATRIURETIC PEPTIDE - Abnormal; Notable for the following:    B Natriuretic Peptide 375.0 (*)    All other components within normal limits  I-STAT TROPOININ, ED    Imaging Review Dg Chest 2 View  07/31/2014   CLINICAL DATA:  Short of breath.   Dry cough.  EXAM: CHEST  2 VIEW  COMPARISON:  07/09/2012.  FINDINGS: Median sternotomy/CABG. Aortic arch atherosclerosis. The cardiopericardial silhouette is enlarged. Blunting of both costophrenic angles is present. This is probably due to a combination of pleural effusions and emphysema. Flattening of the hemidiaphragms noted on the lateral view. No airspace consolidation. Lingular scarring appears similar to the prior exam. Pulmonary vascular congestion is present with basilar interstitial pulmonary edema.  IMPRESSION: Mild CHF and emphysema.   Electronically Signed   By: Andreas Newport M.D.   On: 07/31/2014 10:52     EKG Interpretation   Date/Time:  Monday July 31 2014 09:49:57 EDT Ventricular Rate:  56 PR Interval:  96 QRS Duration: 88 QT Interval:  450 QTC Calculation: 434 R Axis:   -28 Text Interpretation:  Sinus rhythm Short PR interval Probable left atrial  enlargement LVH with secondary repolarization abnormality Inferior  infarct, old Anterior Q waves, possibly due to LVH Confirmed by ZAVITZ   MD, JOSHUA ([id number]) on 07/31/2014 10:01:13 AM      Filed Vitals:   07/31/14 1430 07/31/14 1445 07/31/14 1500 07/31/14 1531  BP: 137/34 128/40 140/34 140/34  Pulse: 62 63 62 64  Temp:    99.8 F (37.7 C)  TempSrc:    Oral  Resp: 22 24 25 20   SpO2: 95% 96% 98% 99%     MDM   Meds given in ED:  Medications  acetaminophen (TYLENOL) tablet 650 mg (650 mg Oral Given 07/31/14 1539)  cefTRIAXone (ROCEPHIN) injection 1 g (1 g Intramuscular Given 07/31/14 1538)  doxycycline (VIBRA-TABS) tablet 100 mg (100 mg Oral Given 07/31/14 1539)  lidocaine (PF) (XYLOCAINE) 1 % injection 0.9 mL (0.9 mLs Intradermal Given 07/31/14 1539)    Discharge Medication List as of 07/31/2014  3:31 PM    START taking these medications   Details  doxycycline (VIBRAMYCIN) 100 MG capsule Take 1 capsule (100 mg total) by mouth 2 (two) times daily., Starting 07/31/2014, Until Discontinued, Print        Final  diagnoses:  CAP (community acquired pneumonia)   This is a 80 y.o. female with a history of hypertension, coronary artery disease, COPD, MI, peripheral vascular  disease, and diastolic dysfunction who presents to the emergency department complaining of chest heaviness, sore throat, cough and chills ongoing for the past 3-4 days. The patient reports her chest heaviness is intermittent and she denies chest pain or chest tightness. Patient reports she's had a cough for the past 4 days that is not productive. She reports that she is had slight increased shortness of breath over the past 4 days and increased her oxygen at home from 2 L to 3 L via nasal cannula this morning. When asked about the patient's low diastolic blood pressure the patient reports that this is chronic and typical for her. I do see vascular surgery note in Epic which shows her diastolic blood pressure in the 40s. On exam the patient has good bilateral radial pulses. The patient does have a fever of 100.8 and appears nontoxic. On lung exam the patient has rails in her right base. No lower extremity edema is noted. The patient's troponin is within normal limits. BMP is unremarkable. CBC shows mild eczematous with a white count of 10.9. Her hemoglobin is 10.0 which is an improvement from her most recent blood work. BNP is mildly elevated at 375. Chest x-ray indicated mild CHF and emphysema but no infiltrate. However, based on exam the patient has right rails and is febrile and has had an increased cough. This seems consistent with pneumonia.  Patient ambulated in the ED with oxygen with the lowest being 89%. I offered admission, but the patient would rather go home to be with her sick mother and her daughter. After discussing with the patient with the risks of going home and possible deterioration she would still like to go home and will discharge with oral abx with close follow up by PCP this week. She agrees to this plan. As the patient has had  allergic reaction to moxifloxacin, I'm unable to use Levaquin. Will give the patient a gram of Rocephin in the ED as well as her first dose of doxycycline and discharge with doxycycline. Strict return precautions given. I advised the patient to follow-up with their primary care provider this week. I advised the patient to return to the emergency department with new or worsening symptoms or new concerns. The patient verbalized understanding and agreement with plan.    This patient was discussed with and evaluated by Dr. Jodi Mourning who agrees with assessment and plan.    Everlene Farrier, PA-C 07/31/14 1622  Blane Ohara, MD 08/01/14 [phone]

## 2014-09-06 ENCOUNTER — Telehealth: Payer: Self-pay | Admitting: Surgery

## 2014-09-06 NOTE — Telephone Encounter (Signed)
Tried to contact patient but had to [facility]. Will try to call again later.

## 2014-09-06 NOTE — Telephone Encounter (Signed)
Belinda Werner left a message on the appointment line late Tuesday afternoon. Once I called her, she stated that she is having issues and would like to speak with a nurse and possibly make an appointment. She can be reached today at [phone].  Thanks!

## 2014-09-18 ENCOUNTER — Telehealth: Payer: Self-pay | Admitting: *Deleted

## 2014-09-18 ENCOUNTER — Other Ambulatory Visit: Payer: Self-pay | Admitting: *Deleted

## 2014-09-18 DIAGNOSIS — Z48812 Encounter for surgical aftercare following surgery on the circulatory system: Secondary | ICD-10-CM

## 2014-09-18 DIAGNOSIS — M79605 Pain in left leg: Secondary | ICD-10-CM

## 2014-09-18 NOTE — Telephone Encounter (Signed)
Dr. Myra Gianotti said for her to call us if she has any more wounds that do not heal. The skin in the area is now blistered and erythematous per patient. I have made her an appt to come in to see our NP for evaluation since Dr. Myra Gianotti is not in the office this week.

## 2014-09-18 NOTE — Telephone Encounter (Signed)
-----   Message from Fredrich Birks sent at 09/18/2014 10:40 AM EDT ----- Regarding: Triage Please call Dalene Seltzer on the home # listed. She bumped her L LE about 3 weeks ago and has a lump below the knee that is swollen, itchy and painful. Her PCP did x-rays when it happened and said that it was a hematoma. [place] is calling us know, however, because her leg is not getting better and she thinks it may be vascular in nature. '  Thanks, Annabelle Harman

## 2014-09-18 NOTE — Telephone Encounter (Signed)
Patient called to ask that we make her an appt to look at her left shin wound. She hit it about 3 weeks ago and it is still not healed. In Dr. Shela Nevin last office note 04-24-14

## 2014-09-19 ENCOUNTER — Ambulatory Visit (INDEPENDENT_AMBULATORY_CARE_PROVIDER_SITE_OTHER): Payer: Medicare Other | Admitting: Family

## 2014-09-19 ENCOUNTER — Other Ambulatory Visit: Payer: Self-pay

## 2014-09-19 ENCOUNTER — Ambulatory Visit (HOSPITAL_COMMUNITY)
Admission: RE | Admit: 2014-09-19 | Discharge: 2014-09-19 | Disposition: A | Discharge status: Home / Self Care | Payer: Medicare Other | Source: Ambulatory Visit | Attending: Family | Admitting: Family

## 2014-09-19 ENCOUNTER — Encounter: Payer: Self-pay | Admitting: Family

## 2014-09-19 VITALS — BP 113/49 | HR 51 | Temp 97.1°F | Resp 14 | Ht 61.0 in | Wt 157.0 lb

## 2014-09-19 DIAGNOSIS — Z720 Tobacco use: Secondary | ICD-10-CM | POA: Diagnosis not present

## 2014-09-19 DIAGNOSIS — Z9889 Other specified postprocedural states: Secondary | ICD-10-CM

## 2014-09-19 DIAGNOSIS — Z95828 Presence of other vascular implants and grafts: Secondary | ICD-10-CM

## 2014-09-19 DIAGNOSIS — F17200 Nicotine dependence, unspecified, uncomplicated: Secondary | ICD-10-CM

## 2014-09-19 DIAGNOSIS — M7989 Other specified soft tissue disorders: Secondary | ICD-10-CM | POA: Insufficient documentation

## 2014-09-19 DIAGNOSIS — I779 Disorder of arteries and arterioles, unspecified: Secondary | ICD-10-CM

## 2014-09-19 DIAGNOSIS — M79662 Pain in left lower leg: Secondary | ICD-10-CM | POA: Diagnosis not present

## 2014-09-19 DIAGNOSIS — M79605 Pain in left leg: Secondary | ICD-10-CM | POA: Insufficient documentation

## 2014-09-19 DIAGNOSIS — Z48812 Encounter for surgical aftercare following surgery on the circulatory system: Secondary | ICD-10-CM | POA: Diagnosis not present

## 2014-09-19 DIAGNOSIS — I10 Essential (primary) hypertension: Secondary | ICD-10-CM | POA: Diagnosis not present

## 2014-09-19 DIAGNOSIS — I70269 Atherosclerosis of native arteries of extremities with gangrene, unspecified extremity: Secondary | ICD-10-CM | POA: Diagnosis not present

## 2014-09-19 DIAGNOSIS — E785 Hyperlipidemia, unspecified: Secondary | ICD-10-CM | POA: Insufficient documentation

## 2014-09-19 NOTE — Patient Instructions (Signed)
Stroke Prevention Some medical conditions and behaviors are associated with an increased chance of having a stroke. You may prevent a stroke by making healthy choices and managing medical conditions. HOW CAN I REDUCE MY RISK OF HAVING A STROKE?   Stay physically active. Get at least 30 minutes of activity on most or all days.  Do not smoke. It may also be helpful to avoid exposure to secondhand smoke.  Limit alcohol use. Moderate alcohol use is considered to be:  No more than 2 drinks per day for men.  No more than 1 drink per day for nonpregnant women.  Eat healthy foods. This involves:  Eating 5 or more servings of fruits and vegetables a day.  Making dietary changes that address high blood pressure (hypertension), high cholesterol, diabetes, or obesity.  Manage your cholesterol levels.  Making food choices that are high in fiber and low in saturated fat, trans fat, and cholesterol may control cholesterol levels.  Take any prescribed medicines to control cholesterol as directed by your health care provider.  Manage your diabetes.  Controlling your carbohydrate and sugar intake is recommended to manage diabetes.  Take any prescribed medicines to control diabetes as directed by your health care provider.  Control your hypertension.  Making food choices that are low in salt (sodium), saturated fat, trans fat, and cholesterol is recommended to manage hypertension.  Take any prescribed medicines to control hypertension as directed by your health care provider.  Maintain a healthy weight.  Reducing calorie intake and making food choices that are low in sodium, saturated fat, trans fat, and cholesterol are recommended to manage weight.  Stop drug abuse.  Avoid taking birth control pills.  Talk to your health care provider about the risks of taking birth control pills if you are over 35 years old, smoke, get migraines, or have ever had a blood clot.  Get evaluated for sleep  disorders (sleep apnea).  Talk to your health care provider about getting a sleep evaluation if you snore a lot or have excessive sleepiness.  Take medicines only as directed by your health care provider.  For some people, aspirin or blood thinners (anticoagulants) are helpful in reducing the risk of forming abnormal blood clots that can lead to stroke. If you have the irregular heart rhythm of atrial fibrillation, you should be on a blood thinner unless there is a good reason you cannot take them.  Understand all your medicine instructions.  Make sure that other conditions (such as anemia or atherosclerosis) are addressed. SEEK IMMEDIATE MEDICAL CARE IF:   You have sudden weakness or numbness of the face, arm, or leg, especially on one side of the body.  Your face or eyelid droops to one side.  You have sudden confusion.  You have trouble speaking (aphasia) or understanding.  You have sudden trouble seeing in one or both eyes.  You have sudden trouble walking.  You have dizziness.  You have a loss of balance or coordination.  You have a sudden, severe headache with no known cause.  You have new chest pain or an irregular heartbeat. Any of these symptoms may represent a serious problem that is an emergency. Do not wait to see if the symptoms will go away. Get medical help at once. Call your local emergency services (911 in [place] Do not drive yourself to the hospital. Document Released: 03/06/2004 Document Revised: 06/13/2013 Document Reviewed: 07/30/2012 [employer] Patient Information 2015 [employer]. This information is not intended to replace advice given   to you by your health care provider. Make sure you discuss any questions you have with your health care provider.   Peripheral Vascular Disease Peripheral Vascular Disease (PVD), also called Peripheral Arterial Disease (PAD), is a circulation problem caused by cholesterol (atherosclerotic plaque) deposits in the arteries.  PVD commonly occurs in the lower extremities (legs) but it can occur in other areas of the body, such as your arms. The cholesterol buildup in the arteries reduces blood flow which can cause pain and other serious problems. The presence of PVD can place a person at risk for Coronary Artery Disease (CAD).  CAUSES  Causes of PVD can be many. It is usually associated with more than one risk factor such as:   High Cholesterol.  Smoking.  Diabetes.  Lack of exercise or inactivity.  High blood pressure (hypertension).  Obesity.  Family history. SYMPTOMS   When the lower extremities are affected, patients with PVD may experience:  Leg pain with exertion or physical activity. This is called INTERMITTENT CLAUDICATION. This may present as cramping or numbness with physical activity. The location of the pain is associated with the level of blockage. For example, blockage at the abdominal level (distal abdominal aorta) may result in buttock or hip pain. Lower leg arterial blockage may result in calf pain.  As PVD becomes more severe, pain can develop with less physical activity.  In people with severe PVD, leg pain may occur at rest.  Other PVD signs and symptoms:  Leg numbness or weakness.  Coldness in the affected leg or foot, especially when compared to the other leg.  A change in leg color.  Patients with significant PVD are more prone to ulcers or sores on toes, feet or legs. These may take longer to heal or may reoccur. The ulcers or sores can become infected.  If signs and symptoms of PVD are ignored, gangrene may occur. This can result in the loss of toes or loss of an entire limb.  Not all leg pain is related to PVD. Other medical conditions can cause leg pain such as:  Blood clots (embolism) or Deep Vein Thrombosis.  Inflammation of the blood vessels (vasculitis).  Spinal stenosis. DIAGNOSIS  Diagnosis of PVD can involve several different types of tests. These can  include:  Pulse Volume Recording Method (PVR). This test is simple, painless and does not involve the use of X-rays. PVR involves measuring and comparing the blood pressure in the arms and legs. An ABI (Ankle-Brachial Index) is calculated. The normal ratio of blood pressures is 1. As this number becomes smaller, it indicates more severe disease.  < 0.95 - indicates significant narrowing in one or more leg vessels.  <0.8 - there will usually be pain in the foot, leg or buttock with exercise.  <0.4 - will usually have pain in the legs at rest.  <0.25 - usually indicates limb threatening PVD.  Doppler detection of pulses in the legs. This test is painless and checks to see if you have a pulses in your legs/feet.  A dye or contrast material (a substance that highlights the blood vessels so they show up on x-ray) may be given to help your caregiver better see the arteries for the following tests. The dye is eliminated from your body by the kidney's. Your caregiver may order blood work to check your kidney function and other laboratory values before the following tests are performed:  Magnetic Resonance Angiography (MRA). An MRA is a picture study of the blood vessels   and arteries. The MRA machine uses a large magnet to produce images of the blood vessels.  Computed Tomography Angiography (CTA). A CTA is a specialized x-ray that looks at how the blood flows in your blood vessels. An IV may be inserted into your arm so contrast dye can be injected.  Angiogram. Is a procedure that uses x-rays to look at your blood vessels. This procedure is minimally invasive, meaning a small incision (cut) is made in your groin. A small tube (catheter) is then inserted into the artery of your groin. The catheter is guided to the blood vessel or artery your caregiver wants to examine. Contrast dye is injected into the catheter. X-rays are then taken of the blood vessel or artery. After the images are obtained, the  catheter is taken out. TREATMENT  Treatment of PVD involves many interventions which may include:  Lifestyle changes:  Quitting smoking.  Exercise.  Following a low fat, low cholesterol diet.  Control of diabetes.  Foot care is very important to the PVD patient. Good foot care can help prevent infection.  Medication:  Cholesterol-lowering medicine.  Blood pressure medicine.  Anti-platelet drugs.  Certain medicines may reduce symptoms of Intermittent Claudication.  Interventional/Surgical options:  Angioplasty. An Angioplasty is a procedure that inflates a balloon in the blocked artery. This opens the blocked artery to improve blood flow.  Stent Implant. A wire mesh tube (stent) is placed in the artery. The stent expands and stays in place, allowing the artery to remain open.  Peripheral Bypass Surgery. This is a surgical procedure that reroutes the blood around a blocked artery to help improve blood flow. This type of procedure may be performed if Angioplasty or stent implants are not an option. SEEK IMMEDIATE MEDICAL CARE IF:   You develop pain or numbness in your arms or legs.  Your arm or leg turns cold, becomes blue in color.  You develop redness, warmth, swelling and pain in your arms or legs. MAKE SURE YOU:   Understand these instructions.  Will watch your condition.  Will get help right away if you are not doing well or get worse. Document Released: 03/06/2004 Document Revised: 04/21/2011 Document Reviewed: 02/01/2008 [employer] Patient Information 2015 [employer]. This information is not intended to replace advice given to you by your health care provider. Make sure you discuss any questions you have with your health care provider.   Smoking Cessation Quitting smoking is important to your health and has many advantages. However, it is not always easy to quit since nicotine is a very addictive drug. Oftentimes, people try 3 times or more before being able  to quit. This document explains the best ways for you to prepare to quit smoking. Quitting takes hard work and a lot of effort, but you can do it. ADVANTAGES OF QUITTING SMOKING  You will live longer, feel better, and live better.  Your body will feel the impact of quitting smoking almost immediately.  Within 20 minutes, blood pressure decreases. Your pulse returns to its normal level.  After 8 hours, carbon monoxide levels in the blood return to normal. Your oxygen level increases.  After 24 hours, the chance of having a heart attack starts to decrease. Your breath, hair, and body stop smelling like smoke.  After 48 hours, damaged nerve endings begin to recover. Your sense of taste and smell improve.  After 72 hours, the body is virtually free of nicotine. Your bronchial tubes relax and breathing becomes easier.  After 2 to   12 weeks, lungs can hold more air. Exercise becomes easier and circulation improves.  The risk of having a heart attack, stroke, cancer, or lung disease is greatly reduced.  After 1 year, the risk of coronary heart disease is cut in half.  After 5 years, the risk of stroke falls to the same as a nonsmoker.  After 10 years, the risk of lung cancer is cut in half and the risk of other cancers decreases significantly.  After 15 years, the risk of coronary heart disease drops, usually to the level of a nonsmoker.  If you are pregnant, quitting smoking will improve your chances of having a healthy baby.  The people you live with, especially any children, will be healthier.  You will have extra money to spend on things other than cigarettes. QUESTIONS TO THINK ABOUT BEFORE ATTEMPTING TO QUIT You may want to talk about your answers with your health care provider.  Why do you want to quit?  If you tried to quit in the past, what helped and what did not?  What will be the most difficult situations for you after you quit? How will you plan to handle them?  Who  can help you through the tough times? Your family? Friends? A health care provider?  What pleasures do you get from smoking? What ways can you still get pleasure if you quit? Here are some questions to ask your health care provider:  How can you help me to be successful at quitting?  What medicine do you think would be best for me and how should I take it?  What should I do if I need more help?  What is smoking withdrawal like? How can I get information on withdrawal? GET READY  Set a quit date.  Change your environment by getting rid of all cigarettes, ashtrays, matches, and lighters in your home, car, or work. Do not let people smoke in your home.  Review your past attempts to quit. Think about what worked and what did not. GET SUPPORT AND ENCOURAGEMENT You have a better chance of being successful if you have help. You can get support in many ways.  Tell your family, friends, and coworkers that you are going to quit and need their support. Ask them not to smoke around you.  Get individual, group, or telephone counseling and support. Programs are available at local hospitals and health centers. Call your local health department for information about programs in your area.  Spiritual beliefs and practices may help some smokers quit.  Download a "quit meter" on your computer to keep track of quit statistics, such as how long you have gone without smoking, cigarettes not smoked, and money saved.  Get a self-help book about quitting smoking and staying off tobacco. LEARN NEW SKILLS AND BEHAVIORS  Distract yourself from urges to smoke. Talk to someone, go for a walk, or occupy your time with a task.  Change your normal routine. Take a different route to work. Drink tea instead of coffee. Eat breakfast in a different place.  Reduce your stress. Take a hot bath, exercise, or read a book.  Plan something enjoyable to do every day. Reward yourself for not smoking.  Explore  interactive web-based programs that specialize in helping you quit. GET MEDICINE AND USE IT CORRECTLY Medicines can help you stop smoking and decrease the urge to smoke. Combining medicine with the above behavioral methods and support can greatly increase your chances of successfully quitting smoking.  Nicotine replacement   therapy helps deliver nicotine to your body without the negative effects and risks of smoking. Nicotine replacement therapy includes nicotine gum, lozenges, inhalers, nasal sprays, and skin patches. Some may be available over-the-counter and others require a prescription.  Antidepressant medicine helps people abstain from smoking, but how this works is unknown. This medicine is available by prescription.  Nicotinic receptor partial agonist medicine simulates the effect of nicotine in your brain. This medicine is available by prescription. Ask your health care provider for advice about which medicines to use and how to use them based on your health history. Your health care provider will tell you what side effects to look out for if you choose to be on a medicine or therapy. Carefully read the information on the package. Do not use any other product containing nicotine while using a nicotine replacement product.  RELAPSE OR DIFFICULT SITUATIONS Most relapses occur within the first 3 months after quitting. Do not be discouraged if you start smoking again. Remember, most people try several times before finally quitting. You may have symptoms of withdrawal because your body is used to nicotine. You may crave cigarettes, be irritable, feel very hungry, cough often, get headaches, or have difficulty concentrating. The withdrawal symptoms are only temporary. They are strongest when you first quit, but they will go away within 10-14 days. To reduce the chances of relapse, try to:  Avoid drinking alcohol. Drinking lowers your chances of successfully quitting.  Reduce the amount of caffeine  you consume. Once you quit smoking, the amount of caffeine in your body increases and can give you symptoms, such as a rapid heartbeat, sweating, and anxiety.  Avoid smokers because they can make you want to smoke.  Do not let weight gain distract you. Many smokers will gain weight when they quit, usually less than 10 pounds. Eat a healthy diet and stay active. You can always lose the weight gained after you quit.  Find ways to improve your mood other than smoking. FOR MORE INFORMATION  www.smokefree.gov  Document Released: 01/21/2001 Document Revised: 06/13/2013 Document Reviewed: 05/08/2011 [employer] Patient Information 2015 [employer]. This information is not intended to replace advice given to you by your health care provider. Make sure you discuss any questions you have with your health care provider.  

## 2014-09-19 NOTE — Progress Notes (Signed)
[facility] [place] HISTORY AND PHYSICAL   MRN : [id number]  History of Present Illness:   Belinda Werner is a 80 y.o. female patient of Dr. Myra Gianotti who has an extensive vascular history, all done at [facility]. She is status post left carotid stenting in 2009. She had a right carotid endarterectomy in 2003. She has had iliac stenting performed as well with repeat dilatation. She has a history of coronary artery disease and has had multiple percutaneous stents. She is long term smoker, has severe COPD, uses supplemental oxygen.  She returns today with complaint of bumping her left leg a month ago with a door accidentally closing on left lower leg. Her left lower leg is now sore with blisters that started forming last week. She did not have rest pain until after she bumped her left leg, pain has worsened in the last week.  Pt Diabetic: No Pt smoker: smoker  (1 ppd, started about age yrs)  Pt meds include: Statin :Yes Betablocker: Yes ASA: Yes Other anticoagulants/antiplatelets: Plavix   Current Outpatient Prescriptions  Medication Sig Dispense Refill  . aspirin 325 MG tablet Take 325 mg by mouth daily.      Marland Kitchen atorvastatin (LIPITOR) 80 MG tablet Take 80 mg by mouth daily.      . clopidogrel (PLAVIX) 75 MG tablet Take 75 mg by mouth daily.      Marland Kitchen doxycycline (VIBRAMYCIN) 100 MG capsule Take 1 capsule (100 mg total) by mouth 2 (two) times daily. 14 capsule 0  . famotidine (PEPCID) 20 MG tablet Take 20 mg by mouth 2 (two) times daily.      . fluticasone (FLONASE) 50 MCG/ACT nasal spray 1 spray by Each Nare route daily.    . fluticasone-salmeterol (ADVAIR HFA) 115-21 MCG/ACT inhaler Inhale 2 puffs into the lungs 2 (two) times daily.      . furosemide (LASIX) 40 MG tablet Take 40 mg by mouth 2 (two) times daily.    Marland Kitchen gabapentin (NEURONTIN) 100 MG capsule Take 100 mg by mouth 3 (three) times daily.    . isosorbide dinitrate (ISORDIL) 30 MG tablet Take 30 mg by mouth  4 (four) times daily.    Marland Kitchen lisinopril (PRINIVIL,ZESTRIL) 10 MG tablet Take 1 tablet (10 mg total) by mouth daily. 30 tablet 0  . losartan (COZAAR) 50 MG tablet Take 50 mg by mouth daily.    . metoprolol (TOPROL-XL) 100 MG 24 hr tablet Take 100 mg by mouth daily.      . nebivolol (BYSTOLIC) 10 MG tablet Take 10 mg by mouth daily.    . potassium chloride SA (K-DUR,KLOR-CON) 20 MEQ tablet Take 2 tablets (40 mEq total) by mouth daily. 60 tablet 0  . PROVENTIL HFA 108 (90 BASE) MCG/ACT inhaler Inhale 1 puff into the lungs every 4 (four) hours as needed. wheezing    . tiotropium (SPIRIVA) 18 MCG inhalation capsule Place 18 mcg into inhaler and inhale daily.       No current facility-administered medications for this visit.    Past Medical History  Diagnosis Date  . Hypertension   . Hypercholesterolemia   . CAD (coronary artery disease)     sig CAD previously in RCA, Cx; history of 3-4 stents and h/o CABG  . COPD (chronic obstructive pulmonary disease)   . MI (myocardial infarction)     history  . PVD (peripheral vascular disease)     refused amputation in the past   . Diastolic dysfunction  echo 09/2008 EF 55%, mild to mod LVH, LA mild to mod dilated  . Pneumonia     Social History History  Substance Use Topics  . Smoking status: Current Every Day Smoker -- 0.50 packs/day for 50 years    Types: Cigarettes  . Smokeless tobacco: Never Used  . Alcohol Use: No    Family History Family History  Problem Relation Age of Onset  . Diabetes Mother   . Varicose Veins Mother   . Cancer Father   . Cancer Sister   . Diabetes Daughter   . Heart disease Daughter   . Hypertension Daughter   . Heart attack Daughter     before age 77  . Heart disease Son   . Hypertension Son   . Heart attack Son     before age 97    Surgical History Past Surgical History  Procedure Laterality Date  . Coronary artery bypass graft    . Abdominal hysterectomy    . Tonsillectomy    . Cholecystectomy     . Appendectomy    . Other surgical history      ?heart valve replacement    Allergies  Allergen Reactions  . Penicillins Hives  . Avelox [Moxifloxacin Hcl In Nacl]     hives    Current Outpatient Prescriptions  Medication Sig Dispense Refill  . aspirin 325 MG tablet Take 325 mg by mouth daily.      Marland Kitchen atorvastatin (LIPITOR) 80 MG tablet Take 80 mg by mouth daily.      . clopidogrel (PLAVIX) 75 MG tablet Take 75 mg by mouth daily.      Marland Kitchen doxycycline (VIBRAMYCIN) 100 MG capsule Take 1 capsule (100 mg total) by mouth 2 (two) times daily. 14 capsule 0  . famotidine (PEPCID) 20 MG tablet Take 20 mg by mouth 2 (two) times daily.      . fluticasone (FLONASE) 50 MCG/ACT nasal spray 1 spray by Each Nare route daily.    . fluticasone-salmeterol (ADVAIR HFA) 115-21 MCG/ACT inhaler Inhale 2 puffs into the lungs 2 (two) times daily.      . furosemide (LASIX) 40 MG tablet Take 40 mg by mouth 2 (two) times daily.    Marland Kitchen gabapentin (NEURONTIN) 100 MG capsule Take 100 mg by mouth 3 (three) times daily.    . isosorbide dinitrate (ISORDIL) 30 MG tablet Take 30 mg by mouth 4 (four) times daily.    Marland Kitchen lisinopril (PRINIVIL,ZESTRIL) 10 MG tablet Take 1 tablet (10 mg total) by mouth daily. 30 tablet 0  . losartan (COZAAR) 50 MG tablet Take 50 mg by mouth daily.    . metoprolol (TOPROL-XL) 100 MG 24 hr tablet Take 100 mg by mouth daily.      . nebivolol (BYSTOLIC) 10 MG tablet Take 10 mg by mouth daily.    . potassium chloride SA (K-DUR,KLOR-CON) 20 MEQ tablet Take 2 tablets (40 mEq total) by mouth daily. 60 tablet 0  . PROVENTIL HFA 108 (90 BASE) MCG/ACT inhaler Inhale 1 puff into the lungs every 4 (four) hours as needed. wheezing    . tiotropium (SPIRIVA) 18 MCG inhalation capsule Place 18 mcg into inhaler and inhale daily.       No current facility-administered medications for this visit.     REVIEW OF SYSTEMS: See HPI for pertinent positives and negatives.  Physical Examination Filed Vitals:    09/19/14 1057  BP: 113/49  Pulse: 51  Temp: 97.1 F (36.2 C)  TempSrc: Oral  Resp:  14  Height: 5\' 1"  (1.549 m)  Weight: 157 lb (71.215 kg)  SpO2: 91%   Body mass index is 29.68 kg/(m^2).  General:  WDWN in NAD Gait: Normal HENT: WNL Eyes: Pupils equal Pulmonary: mildly labored breathing at rest, limited air movement in all fields , without Rales, rhonchi,  o wheezing. Supplemental oxygen by nasal canula/portable tank in place. Cardiac: RRR, no murmur detected  Abdomen: soft, NT, no masses palpated  Skin: no rashes, no ulcers;  no Gangrene , no cellulitis; no open wounds.   VASCULAR EXAM  Carotid Bruits Right Left   Positive Positive    Aorta is not palpable Radial pulses are 2+ palpable and =                         VASCULAR EXAM: Extremities without ischemic changes, without Gangrene; with open wounds: 3 small (0.5 -2 cm in diameter)epidermal layer deep lesions at medial aspect left lower leg. Both feet are moderately dusky, dry skin.                                                                                                          LE Pulses Right Left       FEMORAL  2+ palpable  2+ palpable        POPLITEAL  not palpable   not palpable       POSTERIOR TIBIAL  not palpable   not palpable        DORSALIS PEDIS      ANTERIOR TIBIAL not palpable  not palpable     Musculoskeletal: no muscle wasting or atrophy; 1+ non pitting edema in left lower leg.  Neurologic: A&O X 3; Appropriate Affect ;  SENSATION: equal in both feet to touch MOTOR FUNCTION: 4/5 Symmetric, CN 2-12 intact, adequate dorsi and plantar flexion, Speech is fluent/normal   Non-Invasive Vascular Imaging (09/19/14):   ABI (Date: 09/20/2014)  R: 0.36 (0.49, 04/24/14), DP: monophasic, PT: monophasic, TBI: 0.25 (0.27)  L: <0.29 (0.41), DP: monophasic, PT: monophasic, TBI: 0.23 (0.28)   ASSESSMENT:  Shanteria Laye is a 80 y.o. female who has an extensive vascular history, all done at [facility]. She is status post left carotid stenting in 2009. She had a right carotid endarterectomy in 2003. She has had iliac stenting performed as well with repeat dilatation. She has a history of coronary artery disease and has had multiple percutaneous stents. She is long term smoker, has severe COPD, uses supplemental oxygen.  A door accidentally injured her left lower leg about a month ago.  Since then she has had rest pain in the left lower leg. About a week ago she developed open blisters on the medial aspect of her left lower leg. Today's ABI's indicate worse arterial occlusive disease than five months ago: right LE with severe arterial occlusive disease, left with critical limb ischemia.  Dr. Hart Rochester spoke with pt and daughter and examined pt.   Face to face time with patient was 25 minutes. Over 50% of this  time was spent on counseling and coordination of care.   PLAN:   Based on today's exam and non-invasive vascular lab results, the patient will be scheduled for aortogram with bilateral run off, possible intervention by Dr. Myra Gianotti on 09/26/14. She is scheduled in March 2017 for carotid artery and lower extremity non invasive vascular lab studies.   The patient was counseled re smoking cessation and given several free resources re smoking cessation.  I discussed in depth with the patient the nature of atherosclerosis, and emphasized the importance of maximal medical management including strict control of blood pressure, blood glucose, and lipid levels, obtaining regular exercise, and cessation of smoking.  The patient is aware that without maximal medical management the underlying atherosclerotic disease process will progress, limiting the benefit of any interventions. Consideration for repair of AAA would be made when the size approaches 4.8 or 5.0 cm, growth > 1 cm/yr, and symptomatic status. The patient was given information about stroke prevention and what symptoms should prompt the  patient to seek immediate medical care. The patient was given information about AAA including signs, symptoms, treatment,  what symptoms should prompt the patient to seek immediate medical care, and how to minimize the risk of enlargement and rupture of aneurysms. The patient was given information about PAD including signs, symptoms, treatment, what symptoms should prompt the patient to seek immediate medical care, and risk reduction measures to take. Thank you for allowing [place] to participate in this patient's care.  Charisse March, RN, MSN, FNP-C [facility] Office: [phone]  Clinic MD: Early  09/19/2014 11:01 AM

## 2014-09-26 ENCOUNTER — Ambulatory Visit (HOSPITAL_COMMUNITY)
Admission: RE | Admit: 2014-09-26 | Discharge: 2014-09-26 | Disposition: A | Discharge status: Home / Self Care | Payer: Medicare Other | Source: Ambulatory Visit | Attending: Surgery | Admitting: Surgery

## 2014-09-26 ENCOUNTER — Encounter (HOSPITAL_COMMUNITY): Payer: Self-pay | Admitting: Surgery

## 2014-09-26 ENCOUNTER — Encounter (HOSPITAL_COMMUNITY)
Admission: RE | Disposition: A | Discharge status: Home / Self Care | Payer: Medicare Other | Source: Ambulatory Visit | Attending: Surgery

## 2014-09-26 DIAGNOSIS — Z7982 Long term (current) use of aspirin: Secondary | ICD-10-CM | POA: Diagnosis not present

## 2014-09-26 DIAGNOSIS — I70249 Atherosclerosis of native arteries of left leg with ulceration of unspecified site: Secondary | ICD-10-CM

## 2014-09-26 DIAGNOSIS — I252 Old myocardial infarction: Secondary | ICD-10-CM | POA: Insufficient documentation

## 2014-09-26 DIAGNOSIS — Z7902 Long term (current) use of antithrombotics/antiplatelets: Secondary | ICD-10-CM | POA: Diagnosis not present

## 2014-09-26 DIAGNOSIS — I70248 Atherosclerosis of native arteries of left leg with ulceration of other part of lower left leg: Secondary | ICD-10-CM | POA: Insufficient documentation

## 2014-09-26 DIAGNOSIS — Z7951 Long term (current) use of inhaled steroids: Secondary | ICD-10-CM | POA: Insufficient documentation

## 2014-09-26 DIAGNOSIS — I10 Essential (primary) hypertension: Secondary | ICD-10-CM | POA: Diagnosis not present

## 2014-09-26 DIAGNOSIS — L97821 Non-pressure chronic ulcer of other part of left lower leg limited to breakdown of skin: Secondary | ICD-10-CM | POA: Diagnosis not present

## 2014-09-26 DIAGNOSIS — I2510 Atherosclerotic heart disease of native coronary artery without angina pectoris: Secondary | ICD-10-CM | POA: Diagnosis not present

## 2014-09-26 DIAGNOSIS — J449 Chronic obstructive pulmonary disease, unspecified: Secondary | ICD-10-CM | POA: Diagnosis not present

## 2014-09-26 DIAGNOSIS — I739 Peripheral vascular disease, unspecified: Secondary | ICD-10-CM | POA: Diagnosis present

## 2014-09-26 DIAGNOSIS — Z955 Presence of coronary angioplasty implant and graft: Secondary | ICD-10-CM | POA: Insufficient documentation

## 2014-09-26 DIAGNOSIS — Z880 Allergy status to penicillin: Secondary | ICD-10-CM | POA: Diagnosis not present

## 2014-09-26 DIAGNOSIS — E780 Pure hypercholesterolemia: Secondary | ICD-10-CM | POA: Diagnosis not present

## 2014-09-26 DIAGNOSIS — F17210 Nicotine dependence, cigarettes, uncomplicated: Secondary | ICD-10-CM | POA: Insufficient documentation

## 2014-09-26 DIAGNOSIS — I714 Abdominal aortic aneurysm, without rupture: Secondary | ICD-10-CM | POA: Insufficient documentation

## 2014-09-26 HISTORY — PX: PERIPHERAL VASCULAR CATHETERIZATION: SHX172C

## 2014-09-26 LAB — POCT I-STAT, CHEM 8
BUN: 48 mg/dL — AB (ref 6–20)
CREATININE: 1 mg/dL (ref 0.44–1.00)
Calcium, Ion: 1.21 mmol/L (ref 1.13–1.30)
Chloride: 104 mmol/L (ref 101–111)
GLUCOSE: 99 mg/dL (ref 65–99)
HEMATOCRIT: 42 % (ref 36.0–46.0)
Hemoglobin: 14.3 g/dL (ref 12.0–15.0)
POTASSIUM: 4.2 mmol/L (ref 3.5–5.1)
Sodium: 144 mmol/L (ref 135–145)
TCO2: 30 mmol/L (ref 0–100)

## 2014-09-26 LAB — POCT I-STAT 3, ART BLOOD GAS (G3+)
ACID-BASE EXCESS: 1 mmol/L (ref 0.0–2.0)
ACID-BASE EXCESS: 2 mmol/L (ref 0.0–2.0)
BICARBONATE: 25.4 meq/L — AB (ref 20.0–24.0)
Bicarbonate: 24.8 mEq/L — ABNORMAL HIGH (ref 20.0–24.0)
O2 SAT: 74 %
O2 SAT: 79 %
PO2 ART: 40 mmHg — AB (ref 80.0–100.0)
TCO2: 26 mmol/L (ref 0–100)
TCO2: 26 mmol/L (ref 0–100)
pCO2 arterial: 36.5 mmHg (ref 35.0–45.0)
pCO2 arterial: 35.4 mmHg (ref 35.0–45.0)
pH, Arterial: 7.44 (ref 7.350–7.450)
pH, Arterial: 7.463 — ABNORMAL HIGH (ref 7.350–7.450)
pO2, Arterial: 38 mmHg — CL (ref 80.0–100.0)

## 2014-09-26 LAB — POCT ACTIVATED CLOTTING TIME: ACTIVATED CLOTTING TIME: 294 s

## 2014-09-26 SURGERY — ABDOMINAL AORTOGRAM
Anesthesia: LOCAL

## 2014-09-26 MED ORDER — HEPARIN SODIUM (PORCINE) 1000 UNIT/ML IJ SOLN
INTRAMUSCULAR | Status: DC | PRN
  Administered 2014-09-26: 6000 [IU] via INTRAVENOUS

## 2014-09-26 MED ORDER — ONDANSETRON HCL 4 MG/2ML IJ SOLN: 4.0000 mg | Freq: Four times a day (QID) | INTRAMUSCULAR | Status: DC | PRN

## 2014-09-26 MED ORDER — ACETAMINOPHEN 325 MG RE SUPP: 325.0000 mg | RECTAL | Status: DC | PRN

## 2014-09-26 MED ORDER — ALUM & MAG HYDROXIDE-SIMETH 200-200-20 MG/5ML PO SUSP: 15.0000 mL | ORAL | Status: DC | PRN

## 2014-09-26 MED ORDER — FENTANYL CITRATE (PF) 100 MCG/2ML IJ SOLN
INTRAMUSCULAR | Status: DC | PRN
  Administered 2014-09-26: 50 ug via INTRAVENOUS

## 2014-09-26 MED ORDER — HEPARIN (PORCINE) IN NACL 2-0.9 UNIT/ML-% IJ SOLN
INTRAMUSCULAR | Status: AC
  Filled 2014-09-26: qty 1000

## 2014-09-26 MED ORDER — MORPHINE SULFATE (PF) 2 MG/ML IV SOLN
INTRAVENOUS | Status: AC
  Filled 2014-09-26: qty 1

## 2014-09-26 MED ORDER — MORPHINE SULFATE (PF) 10 MG/ML IV SOLN
2.0000 mg | INTRAVENOUS | Status: DC | PRN
  Administered 2014-09-26: 2 mg via INTRAVENOUS

## 2014-09-26 MED ORDER — GUAIFENESIN-DM 100-10 MG/5ML PO SYRP: 15.0000 mL | ORAL_SOLUTION | ORAL | Status: DC | PRN

## 2014-09-26 MED ORDER — HEPARIN SODIUM (PORCINE) 1000 UNIT/ML IJ SOLN
INTRAMUSCULAR | Status: AC
  Filled 2014-09-26: qty 1

## 2014-09-26 MED ORDER — LIDOCAINE HCL (PF) 1 % IJ SOLN
INTRAMUSCULAR | Status: DC | PRN
  Administered 2014-09-26: 8 mL
  Administered 2014-09-26: 12 mL

## 2014-09-26 MED ORDER — METOPROLOL TARTRATE 1 MG/ML IV SOLN: 2.0000 mg | INTRAVENOUS | Status: DC | PRN

## 2014-09-26 MED ORDER — SODIUM CHLORIDE 0.9 % IV SOLN: INTRAVENOUS | Status: DC

## 2014-09-26 MED ORDER — ACETAMINOPHEN 325 MG PO TABS
325.0000 mg | ORAL_TABLET | ORAL | Status: DC | PRN
  Administered 2014-09-26: 650 mg via ORAL

## 2014-09-26 MED ORDER — OXYCODONE HCL 5 MG PO TABS: 5.0000 mg | ORAL_TABLET | ORAL | Status: DC | PRN

## 2014-09-26 MED ORDER — MIDAZOLAM HCL 2 MG/2ML IJ SOLN
INTRAMUSCULAR | Status: AC
  Filled 2014-09-26: qty 4

## 2014-09-26 MED ORDER — PHENOL 1.4 % MT LIQD: 1.0000 | OROMUCOSAL | Status: DC | PRN

## 2014-09-26 MED ORDER — FENTANYL CITRATE (PF) 100 MCG/2ML IJ SOLN
INTRAMUSCULAR | Status: AC
Start: 2014-09-26 — End: 2014-09-26
  Filled 2014-09-26: qty 4

## 2014-09-26 MED ORDER — HYDRALAZINE HCL 20 MG/ML IJ SOLN
5.0000 mg | INTRAMUSCULAR | Status: AC | PRN
  Administered 2014-09-26 (×3): 5 mg via INTRAVENOUS

## 2014-09-26 MED ORDER — HYDRALAZINE HCL 20 MG/ML IJ SOLN
INTRAMUSCULAR | Status: AC
  Filled 2014-09-26: qty 1

## 2014-09-26 MED ORDER — LIDOCAINE HCL (PF) 1 % IJ SOLN
INTRAMUSCULAR | Status: AC
  Filled 2014-09-26: qty 30

## 2014-09-26 MED ORDER — IODIXANOL 320 MG/ML IV SOLN
INTRAVENOUS | Status: DC | PRN
  Administered 2014-09-26: 220 mL via INTRA_ARTERIAL

## 2014-09-26 MED ORDER — DOCUSATE SODIUM 100 MG PO CAPS: 100.0000 mg | ORAL_CAPSULE | Freq: Every day | ORAL | Status: DC

## 2014-09-26 MED ORDER — ACETAMINOPHEN 325 MG PO TABS
ORAL_TABLET | ORAL | Status: AC
  Administered 2014-09-26: 650 mg via ORAL
  Filled 2014-09-26: qty 2

## 2014-09-26 MED ORDER — MIDAZOLAM HCL 2 MG/2ML IJ SOLN
INTRAMUSCULAR | Status: DC | PRN
  Administered 2014-09-26: 1 mg via INTRAVENOUS

## 2014-09-26 MED ORDER — LABETALOL HCL 5 MG/ML IV SOLN: 10.0000 mg | INTRAVENOUS | Status: DC | PRN

## 2014-09-26 SURGICAL SUPPLY — 20 items
BALLOON POWERFLX PRO 6X60X80 (BALLOONS) ×1 IMPLANT
CATH ANGIO 5F BER2 65CM (CATHETERS) ×1 IMPLANT
CATH OMNI FLUSH 5F 65CM (CATHETERS) ×1 IMPLANT
COVER PRB 48X5XTLSCP FOLD TPE (BAG) IMPLANT
COVER PROBE 5X48 (BAG) ×2
DRAPE ZERO GRAVITY STERILE (DRAPES) ×1 IMPLANT
GUIDEWIRE ANGLED .035X150CM (WIRE) ×1 IMPLANT
KIT ENCORE 26 ADVANTAGE (KITS) ×1 IMPLANT
KIT MICROINTRODUCER STIFF 5F (SHEATH) ×1 IMPLANT
KIT PV (KITS) ×2 IMPLANT
SHEATH BRITE TIP 7FR 35CM (SHEATH) ×1 IMPLANT
SHEATH PINNACLE 5F 10CM (SHEATH) ×2 IMPLANT
SHEATH PINNACLE 7F 10CM (SHEATH) ×1 IMPLANT
STENT SMART CONTROL 8X60X120 (Permanent Stent) ×1 IMPLANT
SYR MEDRAD MARK V 150ML (SYRINGE) ×2 IMPLANT
TRANSDUCER W/STOPCOCK (MISCELLANEOUS) ×2 IMPLANT
TRAY PV CATH (CUSTOM PROCEDURE TRAY) ×2 IMPLANT
WIRE BENTSON .035X145CM (WIRE) ×2 IMPLANT
WIRE HI TORQ VERSACORE J 260CM (WIRE) ×1 IMPLANT
WIRE TORQFLEX AUST .018X40CM (WIRE) ×1 IMPLANT

## 2014-09-26 NOTE — Interval H&P Note (Signed)
History and Physical Interval Note:  09/26/2014 12:01 PM  Belinda Werner  has presented today for surgery, with the diagnosis of pad  The various methods of treatment have been discussed with the patient and family. After consideration of risks, benefits and other options for treatment, the patient has consented to  Procedure(s): Abdominal Aortogram (N/A) as a surgical intervention .  The patient's history has been reviewed, patient examined, no change in status, stable for surgery.  I have reviewed the patient's chart and labs.  Questions were answered to the patient's satisfaction.     [name]

## 2014-09-26 NOTE — Progress Notes (Signed)
Dr. Myra Gianotti paged and made aware of lt groin hematoma. Pain relieved after MSO4.

## 2014-09-26 NOTE — Op Note (Signed)
Patient name: Belinda Werner MRN: [id number] DOB: 1934-08-19 Sex: female  09/26/2014 Pre-operative Diagnosis: Left leg ulcer Post-operative diagnosis:  Same Surgeon:  [name] Procedure Performed:  1.  Ultrasound-guided access, right femoral artery  2.  Ultrasound-guided access, left femoral artery  3.  Abdominal aortogram  4.  Bilateral lower extremity runoff  5.  Stent left common iliac artery  6.  Stent left external iliac artery    Indications:  The patient has had multiple percutaneous interventions performed an [place].  She has recently developed a wound on her left leg and is here for further evaluation  Procedure:  The patient was identified in the holding area and taken to room 8.  The patient was then placed supine on the table and prepped and draped in the usual sterile fashion.  A time out was called.  Ultrasound was used to evaluate the right common femoral artery.  It was patent .  A digital ultrasound image was acquired.  A micropuncture needle was used to access the right common femoral artery under ultrasound guidance.  An 018 wire was advanced without resistance and a micropuncture sheath was placed.  The 018 wire was removed and a benson wire was placed.  The micropuncture sheath was exchanged for a 5 french sheath.  An omniflush catheter was advanced over the wire to the level of L-1.  An abdominal angiogram was obtained.  Next, using the omniflush catheter pelvic angiography was performed followed by bilateral lower extremity runoff.  Findings:   Aortogram:  No significant renal artery stenosis is identified.  Aneurysmal degeneration of the infrarenal abdominal aorta is present.  There is a stent floating within the abdominal aorta.  Bilateral common iliac arteries had stents proximally extending into the aorta with no significant stenosis.  A 80% stenosis is identified within the distal left common iliac artery extending into the left external iliac artery.  The  right iliac system is patent.  Right Lower Extremity:  The common femoral artery is diseased but patent.  The profunda femoral artery is patent.  The superficial femoral artery is occluded proximally with reconstitution of the above-knee popliteal artery.  The dominant runoff vessel is the peroneal artery.  The posterior tibial is patent down to the ankle.  Left Lower Extremity:  The left common femoral artery is diseased but patent.  The profunda femoral artery is patent throughout it's course.  The superficial femoral artery is occluded with reconstitution of the above-knee popliteal artery.  Three-vessel runoff is identified proximally however the posterior tibial occludes in the mid calf the anterior tibial occludes at the ankle and the peroneal artery crosses the ankle.  Intervention:  After the above images were acquired, the decision was made to proceed with intervention.  A second access in the left common femoral artery was obtained under ultrasound guidance using a micropuncture set up.  Ultimately a 7 French sheath was inserted.  The patient was fully heparinized.  Next a woolly wire was inserted.  I elected to treat a stenosis within the distal common iliac artery extending into the proximal iliac artery with a single 8 x 60 Cordis stent.  This was inserted and then deployed with molding using a 6 mm balloon.  Completion imaging revealed resolution of the stenosis down to less than 10%.  Next a short 7 [place] sheath was inserted.  The patient taken the holding area for sheath pull once her coag profile corrects.  Impression:  #1  successful stenting  of the left common and external iliac stenosis using an 8 x 60 Cordis stent.  3 intervention stenosis was 80%, post was less than 10%.  #2  bilateral superficial femoral artery occlusion  #3  Errant stent deployment within the aorta from a previous intervention at an outside center    V. [name], M.D. [facility]  [place] Office: [phone] Pager:  [phone]

## 2014-09-26 NOTE — Discharge Instructions (Signed)

## 2014-09-26 NOTE — Progress Notes (Signed)
Site area: left groin fa sheath pulled and pressure held by Derinda C. Site Prior to Removal:  Level  0 Pressure Applied For:  25 minutes Manual:   yes Patient Status During Pull:  stable Post Pull Site:  Level  1 hematoma above and medial to stick site approx 4 inches wide w/bruising Post Pull Instructions Given:  yes Post Pull Pulses Present: yes Dressing Applied:  yes Bedrest begins @ 1725 Comments:  0

## 2014-09-26 NOTE — H&P (View-Only) (Signed)
[facility] [place] HISTORY AND PHYSICAL   MRN : [id number]  History of Present Illness:   Belinda Werner is a 80 y.o. female patient of Dr. Brabham who has an extensive vascular history, all done at [facility]. She is status post left carotid stenting in 2009. She had a right carotid endarterectomy in 2003. She has had iliac stenting performed as well with repeat dilatation. She has a history of coronary artery disease and has had multiple percutaneous stents. She is long term smoker, has severe COPD, uses supplemental oxygen.  She returns today with complaint of bumping her left leg a month ago with a door accidentally closing on left lower leg. Her left lower leg is now sore with blisters that started forming last week. She did not have rest pain until after she bumped her left leg, pain has worsened in the last week.  Pt Diabetic: No Pt smoker: smoker  (1 ppd, started about age yrs)  Pt meds include: Statin :Yes Betablocker: Yes ASA: Yes Other anticoagulants/antiplatelets: Plavix   Current Outpatient Prescriptions  Medication Sig Dispense Refill  . aspirin 325 MG tablet Take 325 mg by mouth daily.      . atorvastatin (LIPITOR) 80 MG tablet Take 80 mg by mouth daily.      . clopidogrel (PLAVIX) 75 MG tablet Take 75 mg by mouth daily.      . doxycycline (VIBRAMYCIN) 100 MG capsule Take 1 capsule (100 mg total) by mouth 2 (two) times daily. 14 capsule 0  . famotidine (PEPCID) 20 MG tablet Take 20 mg by mouth 2 (two) times daily.      . fluticasone (FLONASE) 50 MCG/ACT nasal spray 1 spray by Each Nare route daily.    . fluticasone-salmeterol (ADVAIR HFA) 115-21 MCG/ACT inhaler Inhale 2 puffs into the lungs 2 (two) times daily.      . furosemide (LASIX) 40 MG tablet Take 40 mg by mouth 2 (two) times daily.    . gabapentin (NEURONTIN) 100 MG capsule Take 100 mg by mouth 3 (three) times daily.    . isosorbide dinitrate (ISORDIL) 30 MG tablet Take 30 mg by mouth  4 (four) times daily.    . lisinopril (PRINIVIL,ZESTRIL) 10 MG tablet Take 1 tablet (10 mg total) by mouth daily. 30 tablet 0  . losartan (COZAAR) 50 MG tablet Take 50 mg by mouth daily.    . metoprolol (TOPROL-XL) 100 MG 24 hr tablet Take 100 mg by mouth daily.      . nebivolol (BYSTOLIC) 10 MG tablet Take 10 mg by mouth daily.    . potassium chloride SA (K-DUR,KLOR-CON) 20 MEQ tablet Take 2 tablets (40 mEq total) by mouth daily. 60 tablet 0  . PROVENTIL HFA 108 (90 BASE) MCG/ACT inhaler Inhale 1 puff into the lungs every 4 (four) hours as needed. wheezing    . tiotropium (SPIRIVA) 18 MCG inhalation capsule Place 18 mcg into inhaler and inhale daily.       No current facility-administered medications for this visit.    Past Medical History  Diagnosis Date  . Hypertension   . Hypercholesterolemia   . CAD (coronary artery disease)     sig CAD previously in RCA, Cx; history of 3-4 stents and h/o CABG  . COPD (chronic obstructive pulmonary disease)   . MI (myocardial infarction)     history  . PVD (peripheral vascular disease)     refused amputation in the past   . Diastolic dysfunction       echo 09/2008 EF 55%, mild to mod LVH, LA mild to mod dilated  . Pneumonia     Social History History  Substance Use Topics  . Smoking status: Current Every Day Smoker -- 0.50 packs/day for 50 years    Types: Cigarettes  . Smokeless tobacco: Never Used  . Alcohol Use: No    Family History Family History  Problem Relation Age of Onset  . Diabetes Mother   . Varicose Veins Mother   . Cancer Father   . Cancer Sister   . Diabetes Daughter   . Heart disease Daughter   . Hypertension Daughter   . Heart attack Daughter     before age 60  . Heart disease Son   . Hypertension Son   . Heart attack Son     before age 60    Surgical History Past Surgical History  Procedure Laterality Date  . Coronary artery bypass graft    . Abdominal hysterectomy    . Tonsillectomy    . Cholecystectomy     . Appendectomy    . Other surgical history      ?heart valve replacement    Allergies  Allergen Reactions  . Penicillins Hives  . Avelox [Moxifloxacin Hcl In Nacl]     hives    Current Outpatient Prescriptions  Medication Sig Dispense Refill  . aspirin 325 MG tablet Take 325 mg by mouth daily.      . atorvastatin (LIPITOR) 80 MG tablet Take 80 mg by mouth daily.      . clopidogrel (PLAVIX) 75 MG tablet Take 75 mg by mouth daily.      . doxycycline (VIBRAMYCIN) 100 MG capsule Take 1 capsule (100 mg total) by mouth 2 (two) times daily. 14 capsule 0  . famotidine (PEPCID) 20 MG tablet Take 20 mg by mouth 2 (two) times daily.      . fluticasone (FLONASE) 50 MCG/ACT nasal spray 1 spray by Each Nare route daily.    . fluticasone-salmeterol (ADVAIR HFA) 115-21 MCG/ACT inhaler Inhale 2 puffs into the lungs 2 (two) times daily.      . furosemide (LASIX) 40 MG tablet Take 40 mg by mouth 2 (two) times daily.    . gabapentin (NEURONTIN) 100 MG capsule Take 100 mg by mouth 3 (three) times daily.    . isosorbide dinitrate (ISORDIL) 30 MG tablet Take 30 mg by mouth 4 (four) times daily.    . lisinopril (PRINIVIL,ZESTRIL) 10 MG tablet Take 1 tablet (10 mg total) by mouth daily. 30 tablet 0  . losartan (COZAAR) 50 MG tablet Take 50 mg by mouth daily.    . metoprolol (TOPROL-XL) 100 MG 24 hr tablet Take 100 mg by mouth daily.      . nebivolol (BYSTOLIC) 10 MG tablet Take 10 mg by mouth daily.    . potassium chloride SA (K-DUR,KLOR-CON) 20 MEQ tablet Take 2 tablets (40 mEq total) by mouth daily. 60 tablet 0  . PROVENTIL HFA 108 (90 BASE) MCG/ACT inhaler Inhale 1 puff into the lungs every 4 (four) hours as needed. wheezing    . tiotropium (SPIRIVA) 18 MCG inhalation capsule Place 18 mcg into inhaler and inhale daily.       No current facility-administered medications for this visit.     REVIEW OF SYSTEMS: See HPI for pertinent positives and negatives.  Physical Examination Filed Vitals:    09/19/14 1057  BP: 113/49  Pulse: 51  Temp: 97.1 F (36.2 C)  TempSrc: Oral  Resp:   14  Height: 5' 1" (1.549 m)  Weight: 157 lb (71.215 kg)  SpO2: 91%   Body mass index is 29.68 kg/(m^2).  General:  WDWN in NAD Gait: Normal HENT: WNL Eyes: Pupils equal Pulmonary: mildly labored breathing at rest, limited air movement in all fields , without Rales, rhonchi,  o wheezing. Supplemental oxygen by nasal canula/portable tank in place. Cardiac: RRR, no murmur detected  Abdomen: soft, NT, no masses palpated  Skin: no rashes, no ulcers;  no Gangrene , no cellulitis; no open wounds.   VASCULAR EXAM  Carotid Bruits Right Left   Positive Positive    Aorta is not palpable Radial pulses are 2+ palpable and =                         VASCULAR EXAM: Extremities without ischemic changes, without Gangrene; with open wounds: 3 small (0.5 -2 cm in diameter)epidermal layer deep lesions at medial aspect left lower leg. Both feet are moderately dusky, dry skin.                                                                                                          LE Pulses Right Left       FEMORAL  2+ palpable  2+ palpable        POPLITEAL  not palpable   not palpable       POSTERIOR TIBIAL  not palpable   not palpable        DORSALIS PEDIS      ANTERIOR TIBIAL not palpable  not palpable     Musculoskeletal: no muscle wasting or atrophy; 1+ non pitting edema in left lower leg.  Neurologic: A&O X 3; Appropriate Affect ;  SENSATION: equal in both feet to touch MOTOR FUNCTION: 4/5 Symmetric, CN 2-12 intact, adequate dorsi and plantar flexion, Speech is fluent/normal   Non-Invasive Vascular Imaging (09/19/14):   ABI (Date: 09/20/2014)  R: 0.36 (0.49, 04/24/14), DP: monophasic, PT: monophasic, TBI: 0.25 (0.27)  L: <0.29 (0.41), DP: monophasic, PT: monophasic, TBI: 0.23 (0.28)   ASSESSMENT:  Chistine Fickling is a 80 y.o. female who has an extensive vascular history, all done at [facility]. She is status post left carotid stenting in 2009. She had a right carotid endarterectomy in 2003. She has had iliac stenting performed as well with repeat dilatation. She has a history of coronary artery disease and has had multiple percutaneous stents. She is long term smoker, has severe COPD, uses supplemental oxygen.  A door accidentally injured her left lower leg about a month ago.  Since then she has had rest pain in the left lower leg. About a week ago she developed open blisters on the medial aspect of her left lower leg. Today's ABI's indicate worse arterial occlusive disease than five months ago: right LE with severe arterial occlusive disease, left with critical limb ischemia.  Dr. Lawson spoke with pt and daughter and examined pt.   Face to face time with patient was 25 minutes. Over 50% of this   time was spent on counseling and coordination of care.   PLAN:   Based on today's exam and non-invasive vascular lab results, the patient will be scheduled for aortogram with bilateral run off, possible intervention by Dr. Brabham on 09/26/14. She is scheduled in March 2017 for carotid artery and lower extremity non invasive vascular lab studies.   The patient was counseled re smoking cessation and given several free resources re smoking cessation.  I discussed in depth with the patient the nature of atherosclerosis, and emphasized the importance of maximal medical management including strict control of blood pressure, blood glucose, and lipid levels, obtaining regular exercise, and cessation of smoking.  The patient is aware that without maximal medical management the underlying atherosclerotic disease process will progress, limiting the benefit of any interventions. Consideration for repair of AAA would be made when the size approaches 4.8 or 5.0 cm, growth > 1 cm/yr, and symptomatic status. The patient was given information about stroke prevention and what symptoms should prompt the  patient to seek immediate medical care. The patient was given information about AAA including signs, symptoms, treatment,  what symptoms should prompt the patient to seek immediate medical care, and how to minimize the risk of enlargement and rupture of aneurysms. The patient was given information about PAD including signs, symptoms, treatment, what symptoms should prompt the patient to seek immediate medical care, and risk reduction measures to take. Thank you for allowing us to participate in this patient's care.  Suzanne Nickel, RN, MSN, FNP-C [facility] Office: [phone]  Clinic MD: Early  09/19/2014 11:01 AM    

## 2014-09-26 NOTE — Progress Notes (Signed)
Pt has had a bloody nose prior to arrival, not active currently. She states she gets them frequently due to O2 via [place] continuously.

## 2014-09-26 NOTE — Progress Notes (Signed)
Site area: rt groin fa sheath Site Prior to Removal:  Level  0 Pressure Applied For:  20 minutes Manual:   yes Patient Status During Pull:  stable Post Pull Site:  Level  0 Post Pull Instructions Given:  yes Post Pull Pulses Present: yes Dressing Applied: yes  Bedrest begins @  1725 Comments:  0. Medicated for pain

## 2014-09-27 LAB — POCT ACTIVATED CLOTTING TIME
ACTIVATED CLOTTING TIME: 214 s
ACTIVATED CLOTTING TIME: 196 s
ACTIVATED CLOTTING TIME: 177 s

## 2014-09-27 MED FILL — Heparin Sodium (Porcine) 2 Unit/ML in Sodium Chloride 0.9%: INTRAMUSCULAR | Qty: 1000 | Status: AC

## 2014-10-02 ENCOUNTER — Telehealth: Payer: Self-pay | Admitting: Surgery

## 2014-10-02 NOTE — Telephone Encounter (Signed)
Spoke with pt re appt, dpm  °

## 2014-10-02 NOTE — Telephone Encounter (Signed)
-----   Message from Sharee Pimple, RN sent at 09/26/2014  2:30 PM EDT ----- Regarding: Schedule   ----- Message -----    From: Nada Libman, MD    Sent: 09/26/2014   2:00 PM      To: [facility] Charge Pool  09/26/2014:   Surgeon:  [name] Procedure Performed:  1.  Ultrasound-guided access, right femoral artery  2.  Ultrasound-guided access, left femoral artery  3.  Abdominal aortogram  4.  Bilateral lower extremity runoff  5.  Stent left common iliac artery  6.  Stent left external iliac artery  Follow-up 2 weeks

## 2014-10-06 ENCOUNTER — Encounter: Payer: Self-pay | Admitting: Surgery

## 2014-10-09 ENCOUNTER — Encounter: Payer: Self-pay | Admitting: Surgery

## 2014-10-09 ENCOUNTER — Ambulatory Visit (INDEPENDENT_AMBULATORY_CARE_PROVIDER_SITE_OTHER): Payer: Medicare Other | Admitting: Surgery

## 2014-10-09 ENCOUNTER — Encounter: Payer: Medicare Other | Admitting: Surgery

## 2014-10-09 VITALS — BP 119/63 | HR 50 | Temp 97.9°F | Ht 61.0 in | Wt 162.2 lb

## 2014-10-09 DIAGNOSIS — I70269 Atherosclerosis of native arteries of extremities with gangrene, unspecified extremity: Secondary | ICD-10-CM | POA: Diagnosis not present

## 2014-10-09 NOTE — Progress Notes (Signed)
Patient name: Belinda Werner MRN: [id number] DOB: 11-17-1934 Sex: female     Chief Complaint  Patient presents with  . Routine Post Op    2 week, Abdominal Aortogram. Patient complains of itchy skin and some pain at sight.     HISTORY OF PRESENT ILLNESS:  the patient is here today for follow-up. The patient recently bumped her leg a month ago having developed a sore with blisters. She underwent angiography on 09/26/2014 where she had stenting of her left common and external iliac artery with a 8 x 60 Cordis self-expanding stent.  Preintervention the stenosis was 80%.     the patient a longer has open wounds on her left leg.  She does complain of itching as well as bruising and a hardness in her left groin   The patienthas an extensive vascular history, all done at [facility]. She is status post left carotid stenting in 2009. She had a right carotid endarterectomy in 2003. She has had iliac stenting performed as well with repeat dilatation. She has a history of coronary artery disease and has had multiple percutaneous stents.  Past Medical History  Diagnosis Date  . Hypertension   . Hypercholesterolemia   . CAD (coronary artery disease)     sig CAD previously in RCA, Cx; history of 3-4 stents and h/o CABG  . COPD (chronic obstructive pulmonary disease)   . MI (myocardial infarction)     history  . PVD (peripheral vascular disease)     refused amputation in the past   . Diastolic dysfunction     echo 05/979 EF 55%, mild to mod LVH, LA mild to mod dilated  . Pneumonia     Past Surgical History  Procedure Laterality Date  . Coronary artery bypass graft    . Abdominal hysterectomy    . Tonsillectomy    . Cholecystectomy    . Appendectomy    . Other surgical history      ?heart valve replacement  . Peripheral vascular catheterization N/A 09/26/2014    Procedure: Abdominal Aortogram;  Surgeon: Nada Libman, MD;  Location: [facility] INVASIVE [facility];  Service: Cardiovascular;   Laterality: N/A;    Social History   Social History  . Marital Status: Divorced    Spouse Name: N/A  . Number of Children: 2  . Years of Education: N/A   Occupational History  . Not on file.   Social History Main Topics  . Smoking status: Current Every Day Smoker -- 0.50 packs/day for 50 years    Types: Cigarettes  . Smokeless tobacco: Never Used  . Alcohol Use: No  . Drug Use: No  . Sexual Activity: No   Other Topics Concern  . Not on file   Social History Narrative   Smoking since age 67 y.o    Family History  Problem Relation Age of Onset  . Diabetes Mother   . Varicose Veins Mother   . Cancer Father   . Cancer Sister   . Diabetes Daughter   . Heart disease Daughter   . Hypertension Daughter   . Heart attack Daughter     before age 19  . Heart disease Son   . Hypertension Son   . Heart attack Son     before age 11    Allergies as of 10/09/2014 - Review Complete 10/09/2014  Allergen Reaction Noted  . Avelox [moxifloxacin hcl in nacl] Hives and Other (See Comments) 07/10/2012  . Penicillins Hives 08/29/2010  Current Outpatient Prescriptions on File Prior to Visit  Medication Sig Dispense Refill  . aspirin 81 MG tablet Take 81 mg by mouth daily.    Marland Kitchen atorvastatin (LIPITOR) 80 MG tablet Take 80 mg by mouth daily.      . clopidogrel (PLAVIX) 75 MG tablet Take 75 mg by mouth daily.      Marland Kitchen doxycycline (VIBRAMYCIN) 100 MG capsule Take 1 capsule (100 mg total) by mouth 2 (two) times daily. 14 capsule 0  . famotidine (PEPCID) 20 MG tablet Take 20 mg by mouth 2 (two) times daily.      . fluticasone (FLONASE) 50 MCG/ACT nasal spray Place 1 spray into both nostrils daily.     . fluticasone-salmeterol (ADVAIR HFA) 115-21 MCG/ACT inhaler Inhale 2 puffs into the lungs 2 (two) times daily.      . furosemide (LASIX) 40 MG tablet Take 40 mg by mouth 2 (two) times daily.    Marland Kitchen gabapentin (NEURONTIN) 100 MG capsule Take 100 mg by mouth 3 (three) times daily.    .  isosorbide dinitrate (ISORDIL) 30 MG tablet Take 30 mg by mouth daily.     Marland Kitchen lisinopril (PRINIVIL,ZESTRIL) 10 MG tablet Take 1 tablet (10 mg total) by mouth daily. 30 tablet 0  . losartan (COZAAR) 50 MG tablet Take 50 mg by mouth daily.    . metoprolol (TOPROL-XL) 100 MG 24 hr tablet Take 100 mg by mouth daily.      . nebivolol (BYSTOLIC) 10 MG tablet Take 10 mg by mouth daily.    . potassium chloride SA (K-DUR,KLOR-CON) 20 MEQ tablet Take 2 tablets (40 mEq total) by mouth daily. 60 tablet 0  . PROVENTIL HFA 108 (90 BASE) MCG/ACT inhaler Inhale 2 puffs into the lungs every 4 (four) hours as needed for wheezing or shortness of breath.     . tiotropium (SPIRIVA) 18 MCG inhalation capsule Place 18 mcg into inhaler and inhale daily.       No current facility-administered medications on file prior to visit.     REVIEW OF SYSTEMS:  see history of present illness, otherwise negative  PHYSICAL EXAMINATION:   Vital signs are  Filed Vitals:   10/09/14 1045  BP: 119/63  Pulse: 50  Temp: 97.9 F (36.6 C)  TempSrc: Oral  Height: 5\' 1"  (1.549 m)  Weight: 162 lb 4 oz (73.596 kg)  SpO2: 91%   Body mass index is 30.67 kg/(m^2). General: The patient appears their stated age. HEENT:  No gross abnormalities Pulmonary:  Non labored breathing Abdomen: Soft and non-tender Musculoskeletal: There are no major deformities. Neurologic: No focal weakness or paresthesias are detected, Skin:  No open wounds. Psychiatric: The patient has normal affect. Cardiovascular: There is a regular rate and rhythm without significant murmur appreciated. Hematoma in the left groin.  This is nonpulsatile.  Extensive ecchymosis over the perieum   Diagnostic Studies  none  Assessment:  atherosclerosis with ulcer Plan:  I brought the patient back today to make sure that her wounds were healing and that no additional intervention needed to be done. She is doing pretty well from her angiogram.  She does have a hematoma  with ecchymosis from the left groin puncture.  I do not think that this is a pseudoaneurysm.  There is no thrill or pulsatility to the area.  I have encouraged her to continue with observation.  Her dizziness that she complains of is in the left arm, chest and lower extremities.  This potentially could be a  contrast reaction and recommended that she start taking Benadryl.  She also has very dry skin on her legs and could benefit from lotion.  I have her scheduled for repeat follow-up in 6 months with vascular lab studies.  Jorge Ny, M.D. [facility] [place] Office: [phone] Pager:  [phone]

## 2015-04-25 ENCOUNTER — Encounter: Payer: Self-pay | Admitting: Family

## 2015-04-30 ENCOUNTER — Ambulatory Visit (INDEPENDENT_AMBULATORY_CARE_PROVIDER_SITE_OTHER)
Admission: RE | Admit: 2015-04-30 | Discharge: 2015-04-30 | Disposition: A | Discharge status: Home / Self Care | Payer: Medicare Other | Source: Ambulatory Visit | Attending: Surgery | Admitting: Surgery

## 2015-04-30 ENCOUNTER — Ambulatory Visit (HOSPITAL_COMMUNITY)
Admission: RE | Admit: 2015-04-30 | Discharge: 2015-04-30 | Disposition: A | Discharge status: Home / Self Care | Payer: Medicare Other | Source: Ambulatory Visit | Attending: Surgery | Admitting: Surgery

## 2015-04-30 ENCOUNTER — Ambulatory Visit (INDEPENDENT_AMBULATORY_CARE_PROVIDER_SITE_OTHER): Payer: Medicare Other | Admitting: Family

## 2015-04-30 ENCOUNTER — Ambulatory Visit (HOSPITAL_COMMUNITY)
Admission: RE | Admit: 2015-04-30 | Discharge: 2015-04-30 | Disposition: A | Discharge status: Home / Self Care | Payer: Medicare Other | Source: Ambulatory Visit | Attending: Family | Admitting: Family

## 2015-04-30 ENCOUNTER — Encounter: Payer: Self-pay | Admitting: Family

## 2015-04-30 VITALS — BP 143/50 | HR 65 | Resp 16 | Ht 61.0 in | Wt 159.0 lb

## 2015-04-30 DIAGNOSIS — I779 Disorder of arteries and arterioles, unspecified: Secondary | ICD-10-CM

## 2015-04-30 DIAGNOSIS — Z720 Tobacco use: Secondary | ICD-10-CM | POA: Diagnosis not present

## 2015-04-30 DIAGNOSIS — I739 Peripheral vascular disease, unspecified: Secondary | ICD-10-CM | POA: Diagnosis present

## 2015-04-30 DIAGNOSIS — I119 Hypertensive heart disease without heart failure: Secondary | ICD-10-CM | POA: Diagnosis not present

## 2015-04-30 DIAGNOSIS — I6523 Occlusion and stenosis of bilateral carotid arteries: Secondary | ICD-10-CM

## 2015-04-30 DIAGNOSIS — I252 Old myocardial infarction: Secondary | ICD-10-CM | POA: Insufficient documentation

## 2015-04-30 DIAGNOSIS — Z95828 Presence of other vascular implants and grafts: Secondary | ICD-10-CM

## 2015-04-30 DIAGNOSIS — Z9889 Other specified postprocedural states: Secondary | ICD-10-CM

## 2015-04-30 DIAGNOSIS — F17200 Nicotine dependence, unspecified, uncomplicated: Secondary | ICD-10-CM

## 2015-04-30 DIAGNOSIS — E7800 Pure hypercholesterolemia, unspecified: Secondary | ICD-10-CM | POA: Diagnosis not present

## 2015-04-30 DIAGNOSIS — I2510 Atherosclerotic heart disease of native coronary artery without angina pectoris: Secondary | ICD-10-CM | POA: Diagnosis not present

## 2015-04-30 NOTE — Progress Notes (Signed)
[facility] [place] HISTORY AND PHYSICAL   MRN : [id number]  History of Present Illness:   Belinda Werner is a 81 y.o. female patient of Dr. Myra Gianotti who is s/p angiography on 09/26/2014 where she had stenting of her left common and external iliac artery with a 8 x 60 Cordis self-expanding stent. Preintervention the stenosis was 80%.Preintervention she had bumped her left leg a month prior having developed a sore with blisters.   She has an extensive vascular history, mostly done at [facility]. She is status post left carotid stenting in 2009. She had a right carotid endarterectomy in 2003. She has had iliac stenting performed as well with repeat dilatation. She has a history of coronary artery disease and has had multiple percutaneous stents. She is long term smoker, has severe COPD, uses supplemental oxygen.  She denies any known history of stroke or TIA.   She had an MI, then CABG about 2004.  Pt Diabetic: No Pt smoker: smoker (1 ppd, started about age yrs)  Pt meds include: Statin :Yes Betablocker: Yes ASA: Yes Other anticoagulants/antiplatelets: Plavix    Current Outpatient Prescriptions  Medication Sig Dispense Refill  . aspirin 81 MG tablet Take 81 mg by mouth daily.    Marland Kitchen atorvastatin (LIPITOR) 80 MG tablet Take 80 mg by mouth daily.      . clopidogrel (PLAVIX) 75 MG tablet Take 75 mg by mouth daily.      Marland Kitchen doxycycline (VIBRAMYCIN) 100 MG capsule Take 1 capsule (100 mg total) by mouth 2 (two) times daily. 14 capsule 0  . famotidine (PEPCID) 20 MG tablet Take 20 mg by mouth 2 (two) times daily.      . fluticasone (FLONASE) 50 MCG/ACT nasal spray Place 1 spray into both nostrils daily.     . fluticasone-salmeterol (ADVAIR HFA) 115-21 MCG/ACT inhaler Inhale 2 puffs into the lungs 2 (two) times daily.      . furosemide (LASIX) 40 MG tablet Take 40 mg by mouth 2 (two) times daily.    Marland Kitchen gabapentin (NEURONTIN) 100 MG capsule Take 100 mg by mouth 3  (three) times daily.    . isosorbide dinitrate (ISORDIL) 30 MG tablet Take 30 mg by mouth daily.     Marland Kitchen lisinopril (PRINIVIL,ZESTRIL) 10 MG tablet Take 1 tablet (10 mg total) by mouth daily. 30 tablet 0  . losartan (COZAAR) 50 MG tablet Take 50 mg by mouth daily.    . metoprolol (TOPROL-XL) 100 MG 24 hr tablet Take 100 mg by mouth daily.      . nebivolol (BYSTOLIC) 10 MG tablet Take 10 mg by mouth daily.    . potassium chloride SA (K-DUR,KLOR-CON) 20 MEQ tablet Take 2 tablets (40 mEq total) by mouth daily. 60 tablet 0  . PROVENTIL HFA 108 (90 BASE) MCG/ACT inhaler Inhale 2 puffs into the lungs every 4 (four) hours as needed for wheezing or shortness of breath.     . tiotropium (SPIRIVA) 18 MCG inhalation capsule Place 18 mcg into inhaler and inhale daily.       No current facility-administered medications for this visit.    Past Medical History  Diagnosis Date  . Hypertension   . Hypercholesterolemia   . CAD (coronary artery disease)     sig CAD previously in RCA, Cx; history of 3-4 stents and h/o CABG  . COPD (chronic obstructive pulmonary disease)   . MI (myocardial infarction)     history  . PVD (peripheral vascular disease)  refused amputation in the past   . Diastolic dysfunction     echo 9/60458/2010 EF 55%, mild to mod LVH, LA mild to mod dilated  . Pneumonia     Social History Social History  Substance Use Topics  . Smoking status: Current Every Day Smoker -- 0.50 packs/day for 50 years    Types: Cigarettes  . Smokeless tobacco: Never Used  . Alcohol Use: No    Family History Family History  Problem Relation Age of Onset  . Diabetes Mother   . Varicose Veins Mother   . Cancer Father   . Cancer Sister   . Diabetes Daughter   . Heart disease Daughter   . Hypertension Daughter   . Heart attack Daughter     before age 79  . Heart disease Son   . Hypertension Son   . Heart attack Son     before age 79    Surgical History Past Surgical History  Procedure  Laterality Date  . Coronary artery bypass graft    . Abdominal hysterectomy    . Tonsillectomy    . Cholecystectomy    . Appendectomy    . Other surgical history      ?heart valve replacement  . Peripheral vascular catheterization N/A 09/26/2014    Procedure: Abdominal Aortogram;  Surgeon: Nada LibmanVance W Brabham, MD;  Location: [facility];  Service: Cardiovascular;  Laterality: N/A;    Allergies  Allergen Reactions  . Avelox [Moxifloxacin Hcl In Nacl] Hives and Other (See Comments)    hives  . Penicillins Hives    Current Outpatient Prescriptions  Medication Sig Dispense Refill  . aspirin 81 MG tablet Take 81 mg by mouth daily.    Marland Kitchen. atorvastatin (LIPITOR) 80 MG tablet Take 80 mg by mouth daily.      . clopidogrel (PLAVIX) 75 MG tablet Take 75 mg by mouth daily.      Marland Kitchen. doxycycline (VIBRAMYCIN) 100 MG capsule Take 1 capsule (100 mg total) by mouth 2 (two) times daily. 14 capsule 0  . famotidine (PEPCID) 20 MG tablet Take 20 mg by mouth 2 (two) times daily.      . fluticasone (FLONASE) 50 MCG/ACT nasal spray Place 1 spray into both nostrils daily.     . fluticasone-salmeterol (ADVAIR HFA) 115-21 MCG/ACT inhaler Inhale 2 puffs into the lungs 2 (two) times daily.      . furosemide (LASIX) 40 MG tablet Take 40 mg by mouth 2 (two) times daily.    Marland Kitchen. gabapentin (NEURONTIN) 100 MG capsule Take 100 mg by mouth 3 (three) times daily.    . isosorbide dinitrate (ISORDIL) 30 MG tablet Take 30 mg by mouth daily.     Marland Kitchen. lisinopril (PRINIVIL,ZESTRIL) 10 MG tablet Take 1 tablet (10 mg total) by mouth daily. 30 tablet 0  . losartan (COZAAR) 50 MG tablet Take 50 mg by mouth daily.    . metoprolol (TOPROL-XL) 100 MG 24 hr tablet Take 100 mg by mouth daily.      . nebivolol (BYSTOLIC) 10 MG tablet Take 10 mg by mouth daily.    . potassium chloride SA (K-DUR,KLOR-CON) 20 MEQ tablet Take 2 tablets (40 mEq total) by mouth daily. 60 tablet 0  . PROVENTIL HFA 108 (90 BASE) MCG/ACT inhaler Inhale 2 puffs into  the lungs every 4 (four) hours as needed for wheezing or shortness of breath.     . tiotropium (SPIRIVA) 18 MCG inhalation capsule Place 18 mcg into inhaler and inhale daily.  No current facility-administered medications for this visit.     REVIEW OF SYSTEMS: See HPI for pertinent positives and negatives.  Physical Examination Filed Vitals:   04/30/15 1346 04/30/15 1348 04/30/15 1349  BP: 150/48 119/51 143/50  Pulse: 67 67 65  Resp: 16    Height:  (1.549 m)    Weight: 159 lb (72.122 kg)    SpO2: 96%     Body mass index is 30.06 kg/(m^2).   General: WDWN obese female in NAD Gait: Normal HENT: WNL Eyes: Pupils equal Pulmonary: mildly labored breathing at rest, limited air movement in all fields , without Rales, rhonchi, or wheezing. Supplemental oxygen by nasal canula/portable tank in place. Cardiac: RRR, + murmur  Abdomen: softly obese, NT, no masses palpated  Skin: no rashes, no ulcers, no cellulitis.   VASCULAR EXAM  Carotid Bruits Right Left   Positive Positive   Aorta is not palpable Radial pulses are 2+ palpable and =   VASCULAR EXAM: Extremities without ischemic changes, without Gangrene; no open wounds: lesions have healed at medial aspect left lower leg. Both feet are moderately dusky, dry skin.     LE Pulses Right Left   FEMORAL faintly palpable 2+ palpable    POPLITEAL not palpable  not palpable   POSTERIOR TIBIAL not palpable, Doppler monophasic   not palpable, Doppler monophasic    DORSALIS PEDIS  ANTERIOR TIBIAL not palpable, Doppler monophasic   not palpable, Doppler monophasic     Musculoskeletal: no muscle wasting or atrophy; no peripheral edema Neurologic: A&O X 3; Appropriate Affect ;  SENSATION: equal in both  feet to touch MOTOR FUNCTION: 5/5 Symmetric in upper extremities, 4/5 in lower extremities,  CN 2-12 intact, adequate dorsi and plantar flexion, Speech is fluent/normal           Non-Invasive Vascular Imaging (04/30/2015):   Carotid Duplex: Global waveforms appear abnormal and may be suggestive of a cardiac issue which would make velocity information less accurate for diagnostic interpretation. Unable to categorize stenosis with percentage. Right CEA site appears to have signficant disease of the proximal patch and bifurcation. Internal carotid artery waveforms are turbulent.  Left carotid stent with mild disease in the proximal stent and significant disease in the distal stent at the exit. Significant ECA stenosis. Bilateral vertebral artery is antegrade.  Velocities have increased bilaterally.  ABI:  R: 0.66 (0.36, 09/19/14), DP: monophasic, PT: monophasic, TBI: absent  L: 0.42 (0.26), DP: monophasic, PT: moniphasic, TBI: absent    ASSESSMENT:  Belinda Werner is a 81 y.o. female who is s/p angiography on 09/26/2014 where she had stenting of her left common and external iliac artery with a 8 x 60 Cordis self-expanding stent. Preintervention the stenosis was 80%.Preintervention she had bumped her left leg a month prior having developed a sore with blisters.   She has an extensive vascular history, mostly done at [facility]. She is status post left carotid stenting in 2009. She had a right carotid endarterectomy in 2003. She has had iliac stenting performed as well with repeat dilatation. She has a history of coronary artery disease and has had multiple percutaneous stents. She is long term smoker, has severe COPD, uses supplemental oxygen. Despite requiring supplemental oxygen, she continues to smoke.  Today's carotid duplex indicates global waveforms appear abnormal and may be suggestive of a cardiac issue which would make velocity information less accurate for diagnostic  interpretation. Unable to categorize stenosis with percentage. Right CEA site appears to have signficant disease of  the proximal patch and bifurcation. Internal carotid artery waveforms are turbulent.  Left carotid stent with mild disease in the proximal stent and significant disease in the distal stent at the exit. Significant ECA stenosis. Bilateral vertebral artery is antegrade.  Velocities have increased bilaterally.   ABI's have improved bilaterally since the stenting of her left CCA and left EIA.  Her walking is limited by dyspnea and generalized deconditioning.    PLAN:   Daily seated and standing leg exercises demonstrated and discussed with pt and daughter.  The patient was counseled re smoking cessation and given several free resources re smoking cessation.   Based on today's exam and non-invasive vascular lab results, and after discussing with Dr. Myra Gianotti, the patient will follow up in 6 months with the following tests: carotid duplex, ABI's, and bilateral aortoiliac duplex.  I discussed in depth with the patient the nature of atherosclerosis, and emphasized the importance of maximal medical management including strict control of blood pressure, blood glucose, and lipid levels, obtaining regular exercise, and cessation of smoking.  The patient is aware that without maximal medical management the underlying atherosclerotic disease process will progress, limiting the benefit of any interventions.  The patient was given information about stroke prevention and what symptoms should prompt the patient to seek immediate medical care.  The patient was given information about PAD including signs, symptoms, treatment, what symptoms should prompt the patient to seek immediate medical care, and risk reduction measures to take. Thank you for allowing [place] to participate in this patient's care.  Charisse March, RN, MSN, FNP-C [facility] Office: [phone]  Clinic MD: Myra Gianotti   04/30/2015 1:26 PM

## 2015-04-30 NOTE — Patient Instructions (Addendum)
Stroke Prevention Some medical conditions and behaviors are associated with an increased chance of having a stroke. You may prevent a stroke by making healthy choices and managing medical conditions. HOW CAN I REDUCE MY RISK OF HAVING A STROKE?   Stay physically active. Get at least 30 minutes of activity on most or all days.  Do not smoke. It may also be helpful to avoid exposure to secondhand smoke.  Limit alcohol use. Moderate alcohol use is considered to be:  No more than 2 drinks per day for men.  No more than 1 drink per day for nonpregnant women.  Eat healthy foods. This involves:  Eating 5 or more servings of fruits and vegetables a day.  Making dietary changes that address high blood pressure (hypertension), high cholesterol, diabetes, or obesity.  Manage your cholesterol levels.  Making food choices that are high in fiber and low in saturated fat, trans fat, and cholesterol may control cholesterol levels.  Take any prescribed medicines to control cholesterol as directed by your health care provider.  Manage your diabetes.  Controlling your carbohydrate and sugar intake is recommended to manage diabetes.  Take any prescribed medicines to control diabetes as directed by your health care provider.  Control your hypertension.  Making food choices that are low in salt (sodium), saturated fat, trans fat, and cholesterol is recommended to manage hypertension.  Ask your health care provider if you need treatment to lower your blood pressure. Take any prescribed medicines to control hypertension as directed by your health care provider.  If you are 18-39 years of age, have your blood pressure checked every 3-5 years. If you are 40 years of age or older, have your blood pressure checked every year.  Maintain a healthy weight.  Reducing calorie intake and making food choices that are low in sodium, saturated fat, trans fat, and cholesterol are recommended to manage  weight.  Stop drug abuse.  Avoid taking birth control pills.  Talk to your health care provider about the risks of taking birth control pills if you are over 35 years old, smoke, get migraines, or have ever had a blood clot.  Get evaluated for sleep disorders (sleep apnea).  Talk to your health care provider about getting a sleep evaluation if you snore a lot or have excessive sleepiness.  Take medicines only as directed by your health care provider.  For some people, aspirin or blood thinners (anticoagulants) are helpful in reducing the risk of forming abnormal blood clots that can lead to stroke. If you have the irregular heart rhythm of atrial fibrillation, you should be on a blood thinner unless there is a good reason you cannot take them.  Understand all your medicine instructions.  Make sure that other conditions (such as anemia or atherosclerosis) are addressed. SEEK IMMEDIATE MEDICAL CARE IF:   You have sudden weakness or numbness of the face, arm, or leg, especially on one side of the body.  Your face or eyelid droops to one side.  You have sudden confusion.  You have trouble speaking (aphasia) or understanding.  You have sudden trouble seeing in one or both eyes.  You have sudden trouble walking.  You have dizziness.  You have a loss of balance or coordination.  You have a sudden, severe headache with no known cause.  You have new chest pain or an irregular heartbeat. Any of these symptoms may represent a serious problem that is an emergency. Do not wait to see if the symptoms will   go away. Get medical help at once. Call your local emergency services (911 in [place] Do not drive yourself to the hospital.   This information is not intended to replace advice given to you by your health care provider. Make sure you discuss any questions you have with your health care provider.   Document Released: 03/06/2004 Document Revised: 02/17/2014 Document Reviewed:  07/30/2012 Elsevier Interactive Patient Education 2016 [employer]    Peripheral Vascular Disease Peripheral vascular disease (PVD) is a disease of the blood vessels that are not part of your heart and brain. A simple term for PVD is poor circulation. In most cases, PVD narrows the blood vessels that carry blood from your heart to the rest of your body. This can result in a decreased supply of blood to your arms, legs, and internal organs, like your stomach or kidneys. However, it most often affects a person's lower legs and feet. There are two types of PVD.  Organic PVD. This is the more common type. It is caused by damage to the structure of blood vessels.  Functional PVD. This is caused by conditions that make blood vessels contract and tighten (spasm). Without treatment, PVD tends to get worse over time. PVD can also lead to acute ischemic limb. This is when an arm or limb suddenly has trouble getting enough blood. This is a medical emergency. CAUSES Each type of PVD has many different causes. The most common cause of PVD is buildup of a fatty material (plaque) inside of your arteries (atherosclerosis). Small amounts of plaque can break off from the walls of the blood vessels and become lodged in a smaller artery. This blocks blood flow and can cause acute ischemic limb. Other common causes of PVD include:  Blood clots that form inside of blood vessels.  Injuries to blood vessels.  Diseases that cause inflammation of blood vessels or cause blood vessel spasms.  Health behaviors and health history that increase your risk of developing PVD. RISK FACTORS  You may have a greater risk of PVD if you:  Have a family history of PVD.  Have certain medical conditions, including:  High cholesterol.  Diabetes.  High blood pressure (hypertension).  Coronary heart disease.  Past problems with blood clots.  Past injury, such as burns or a broken bone. These may have damaged blood  vessels in your limbs.  Buerger disease. This is caused by inflamed blood vessels in your hands and feet.  Some forms of arthritis.  Rare birth defects that affect the arteries in your legs.  Use tobacco.  Do not get enough exercise.  Are obese.  Are age 50 or older. SIGNS AND SYMPTOMS  PVD may cause many different symptoms. Your symptoms depend on what part of your body is not getting enough blood. Some common signs and symptoms include:  Cramps in your lower legs. This may be a symptom of poor leg circulation (claudication).  Pain and weakness in your legs while you are physically active that goes away when you rest (intermittent claudication).  Leg pain when at rest.  Leg numbness, tingling, or weakness.  Coldness in a leg or foot, especially when compared with the other leg.  Skin or hair changes. These can include:  Hair loss.  Shiny skin.  Pale or bluish skin.  Thick toenails.  Inability to get or maintain an erection (erectile dysfunction). People with PVD are more prone to developing ulcers and sores on their toes, feet, or legs. These may take longer than   normal to heal. DIAGNOSIS Your health care provider may diagnose PVD from your signs and symptoms. The health care provider will also do a physical exam. You may have tests to find out what is causing your PVD and determine its severity. Tests may include:  Blood pressure recordings from your arms and legs and measurements of the strength of your pulses (pulse volume recordings).  Imaging studies using sound waves to take pictures of the blood flow through your blood vessels (Doppler ultrasound).  Injecting a dye into your blood vessels before having imaging studies using:  X-rays (angiogram or arteriogram).  Computer-generated X-rays (CT angiogram).  A powerful electromagnetic field and a computer (magnetic resonance angiogram or MRA). TREATMENT Treatment for PVD depends on the cause of your condition  and the severity of your symptoms. It also depends on your age. Underlying causes need to be treated and controlled. These include long-lasting (chronic) conditions, such as diabetes, high cholesterol, and high blood pressure. You may need to first try making lifestyle changes and taking medicines. Surgery may be needed if these do not work. Lifestyle changes may include:  Quitting smoking.  Exercising regularly.  Following a low-fat, low-cholesterol diet. Medicines may include:  Blood thinners to prevent blood clots.  Medicines to improve blood flow.  Medicines to improve your blood cholesterol levels. Surgical procedures may include:  A procedure that uses an inflated balloon to open a blocked artery and improve blood flow (angioplasty).  A procedure to put in a tube (stent) to keep a blocked artery open (stent implant).  Surgery to reroute blood flow around a blocked artery (peripheral bypass surgery).  Surgery to remove dead tissue from an infected wound on the affected limb.  Amputation. This is surgical removal of the affected limb. This may be necessary in cases of acute ischemic limb that are not improved through medical or surgical treatments. HOME CARE INSTRUCTIONS  Take medicines only as directed by your health care provider.  Do not use any tobacco products, including cigarettes, chewing tobacco, or electronic cigarettes. If you need help quitting, ask your health care provider.  Lose weight if you are overweight, and maintain a healthy weight as directed by your health care provider.  Eat a diet that is low in fat and cholesterol. If you need help, ask your health care provider.  Exercise regularly. Ask your health care provider to suggest some good activities for you.  Use compression stockings or other mechanical devices as directed by your health care provider.  Take good care of your feet.  Wear comfortable shoes that fit well.  Check your feet often for  any cuts or sores. SEEK MEDICAL CARE IF:  You have cramps in your legs while walking.  You have leg pain when you are at rest.  You have coldness in a leg or foot.  Your skin changes.  You have erectile dysfunction.  You have cuts or sores on your feet that are not healing. SEEK IMMEDIATE MEDICAL CARE IF:  Your arm or leg turns cold and blue.  Your arms or legs become red, warm, swollen, painful, or numb.  You have chest pain or trouble breathing.  You suddenly have weakness in your face, arm, or leg.  You become very confused or lose the ability to speak.  You suddenly have a very bad headache or lose your vision.   This information is not intended to replace advice given to you by your health care provider. Make sure you discuss any questions   you have with your health care provider.   Document Released: 03/06/2004 Document Revised: 02/17/2014 Document Reviewed: 07/07/2013 Elsevier Interactive Patient Education 2016 [employer] Inc[employer]    Smoking Cessation, Tips for Success If you are ready to quit smoking, congratulations! You have chosen to help yourself be healthier. Cigarettes bring nicotine, tar, carbon monoxide, and other irritants into your body. Your lungs, heart, and blood vessels will be able to work better without these poisons. There are many different ways to quit smoking. Nicotine gum, nicotine patches, a nicotine inhaler, or nicotine nasal spray can help with physical craving. Hypnosis, support groups, and medicines help break the habit of smoking. WHAT THINGS CAN I DO TO MAKE QUITTING EASIER?  Here are some tips to help you quit for good:  Pick a date when you will quit smoking completely. Tell all of your friends and family about your plan to quit on that date.  Do not try to slowly cut down on the number of cigarettes you are smoking. Pick a quit date and quit smoking completely starting on that day.  Throw away all cigarettes.   Clean and remove all  ashtrays from your home, work, and car.  On a card, write down your reasons for quitting. Carry the card with you and read it when you get the urge to smoke.  Cleanse your body of nicotine. Drink enough water and fluids to keep your urine clear or pale yellow. Do this after quitting to flush the nicotine from your body.  Learn to predict your moods. Do not let a bad situation be your excuse to have a cigarette. Some situations in your life might tempt you into wanting a cigarette.  Never have "just one" cigarette. It leads to wanting another and another. Remind yourself of your decision to quit.  Change habits associated with smoking. If you smoked while driving or when feeling stressed, try other activities to replace smoking. Stand up when drinking your coffee. Brush your teeth after eating. Sit in a different chair when you read the paper. Avoid alcohol while trying to quit, and try to drink fewer caffeinated beverages. Alcohol and caffeine may urge you to smoke.  Avoid foods and drinks that can trigger a desire to smoke, such as sugary or spicy foods and alcohol.  Ask people who smoke not to smoke around you.  Have something planned to do right after eating or having a cup of coffee. For example, plan to take a walk or exercise.  Try a relaxation exercise to calm you down and decrease your stress. Remember, you may be tense and nervous for the first 2 weeks after you quit, but this will pass.  Find new activities to keep your hands busy. Play with a pen, coin, or rubber band. Doodle or draw things on paper.  Brush your teeth right after eating. This will help cut down on the craving for the taste of tobacco after meals. You can also try mouthwash.   Use oral substitutes in place of cigarettes. Try using lemon drops, carrots, cinnamon sticks, or chewing gum. Keep them handy so they are available when you have the urge to smoke.  When you have the urge to smoke, try deep  breathing.  Designate your home as a nonsmoking area.  If you are a heavy smoker, ask your health care provider about a prescription for nicotine chewing gum. It can ease your withdrawal from nicotine.  Reward yourself. Set aside the cigarette money you save and buy yourself   something nice.  Look for support from others. Join a support group or smoking cessation program. Ask someone at home or at work to help you with your plan to quit smoking.  Always ask yourself, "Do I need this cigarette or is this just a reflex?" Tell yourself, "Today, I choose not to smoke," or "I do not want to smoke." You are reminding yourself of your decision to quit.  Do not replace cigarette smoking with electronic cigarettes (commonly called e-cigarettes). The safety of e-cigarettes is unknown, and some may contain harmful chemicals.  If you relapse, do not give up! Plan ahead and think about what you will do the next time you get the urge to smoke. HOW WILL I FEEL WHEN I QUIT SMOKING? You may have symptoms of withdrawal because your body is used to nicotine (the addictive substance in cigarettes). You may crave cigarettes, be irritable, feel very hungry, cough often, get headaches, or have difficulty concentrating. The withdrawal symptoms are only temporary. They are strongest when you first quit but will go away within 10-14 days. When withdrawal symptoms occur, stay in control. Think about your reasons for quitting. Remind yourself that these are signs that your body is healing and getting used to being without cigarettes. Remember that withdrawal symptoms are easier to treat than the major diseases that smoking can cause.  Even after the withdrawal is over, expect periodic urges to smoke. However, these cravings are generally short lived and will go away whether you smoke or not. Do not smoke! WHAT RESOURCES ARE AVAILABLE TO HELP ME QUIT SMOKING? Your health care provider can direct you to community resources or  hospitals for support, which may include:  Group support.  Education.  Hypnosis.  Therapy.   This information is not intended to replace advice given to you by your health care provider. Make sure you discuss any questions you have with your health care provider.   Document Released: 10/26/2003 Document Revised: 02/17/2014 Document Reviewed: 07/15/2012 Elsevier Interactive Patient Education 2016 [employer] Inc[employer]    Steps to Quit Smoking  Smoking tobacco can be harmful to your health and can affect almost every organ in your body. Smoking puts you, and those around you, at risk for developing many serious chronic diseases. Quitting smoking is difficult, but it is one of the best things that you can do for your health. It is never too late to quit. WHAT ARE THE BENEFITS OF QUITTING SMOKING? When you quit smoking, you lower your risk of developing serious diseases and conditions, such as:  Lung cancer or lung disease, such as COPD.  Heart disease.  Stroke.  Heart attack.  Infertility.  Osteoporosis and bone fractures. Additionally, symptoms such as coughing, wheezing, and shortness of breath may get better when you quit. You may also find that you get sick less often because your body is stronger at fighting off colds and infections. If you are pregnant, quitting smoking can help to reduce your chances of having a baby of low birth weight. HOW DO I GET READY TO QUIT? When you decide to quit smoking, create a plan to make sure that you are successful. Before you quit:  Pick a date to quit. Set a date within the next two weeks to give you time to prepare.  Write down the reasons why you are quitting. Keep this list in places where you will see it often, such as on your bathroom mirror or in your car or wallet.  Identify the people, places,   things, and activities that make you want to smoke (triggers) and avoid them. Make sure to take these actions:  Throw away all cigarettes at  home, at work, and in your car.  Throw away smoking accessories, such as ashtrays and lighters.  Clean your car and make sure to empty the ashtray.  Clean your home, including curtains and carpets.  Tell your family, friends, and coworkers that you are quitting. Support from your loved ones can make quitting easier.  Talk with your health care provider about your options for quitting smoking.  Find out what treatment options are covered by your health insurance. WHAT STRATEGIES CAN I USE TO QUIT SMOKING?  Talk with your healthcare provider about different strategies to quit smoking. Some strategies include:  Quitting smoking altogether instead of gradually lessening how much you smoke over a period of time. Research shows that quitting "cold turkey" is more successful than gradually quitting.  Attending in-person counseling to help you build problem-solving skills. You are more likely to have success in quitting if you attend several counseling sessions. Even short sessions of 10 minutes can be effective.  Finding resources and support systems that can help you to quit smoking and remain smoke-free after you quit. These resources are most helpful when you use them often. They can include:  Online chats with a counselor.  Telephone quitlines.  Printed self-help materials.  Support groups or group counseling.  Text messaging programs.  Mobile phone applications.  Taking medicines to help you quit smoking. (If you are pregnant or breastfeeding, talk with your health care provider first.) Some medicines contain nicotine and some do not. Both types of medicines help with cravings, but the medicines that include nicotine help to relieve withdrawal symptoms. Your health care provider may recommend:  Nicotine patches, gum, or lozenges.  Nicotine inhalers or sprays.  Non-nicotine medicine that is taken by mouth. Talk with your health care provider about combining strategies, such as  taking medicines while you are also receiving in-person counseling. Using these two strategies together makes you more likely to succeed in quitting than if you used either strategy on its own. If you are pregnant or breastfeeding, talk with your health care provider about finding counseling or other support strategies to quit smoking. Do not take medicine to help you quit smoking unless told to do so by your health care provider. WHAT THINGS CAN I DO TO MAKE IT EASIER TO QUIT? Quitting smoking might feel overwhelming at first, but there is a lot that you can do to make it easier. Take these important actions:  Reach out to your family and friends and ask that they support and encourage you during this time. Call telephone quitlines, reach out to support groups, or work with a counselor for support.  Ask people who smoke to avoid smoking around you.  Avoid places that trigger you to smoke, such as bars, parties, or smoke-break areas at work.  Spend time around people who do not smoke.  Lessen stress in your life, because stress can be a smoking trigger for some people. To lessen stress, try:  Exercising regularly.  Deep-breathing exercises.  Yoga.  Meditating.  Performing a body scan. This involves closing your eyes, scanning your body from head to toe, and noticing which parts of your body are particularly tense. Purposefully relax the muscles in those areas.  Download or purchase mobile phone or tablet apps (applications) that can help you stick to your quit plan by providing reminders, tips,   and encouragement. There are many free apps, such as QuitGuide from the CDC (Centers for Disease Control and Prevention). You can find other support for quitting smoking (smoking cessation) through smokefree.gov and other websites. HOW WILL I FEEL WHEN I QUIT SMOKING? Within the first 24 hours of quitting smoking, you may start to feel some withdrawal symptoms. These symptoms are usually most  noticeable 2-3 days after quitting, but they usually do not last beyond 2-3 weeks. Changes or symptoms that you might experience include:  Mood swings.  Restlessness, anxiety, or irritation.  Difficulty concentrating.  Dizziness.  Strong cravings for sugary foods in addition to nicotine.  Mild weight gain.  Constipation.  Nausea.  Coughing or a sore throat.  Changes in how your medicines work in your body.  A depressed mood.  Difficulty sleeping (insomnia). After the first 2-3 weeks of quitting, you may start to notice more positive results, such as:  Improved sense of smell and taste.  Decreased coughing and sore throat.  Slower heart rate.  Lower blood pressure.  Clearer skin.  The ability to breathe more easily.  Fewer sick days. Quitting smoking is very challenging for most people. Do not get discouraged if you are not successful the first time. Some people need to make many attempts to quit before they achieve long-term success. Do your best to stick to your quit plan, and talk with your health care provider if you have any questions or concerns.   This information is not intended to replace advice given to you by your health care provider. Make sure you discuss any questions you have with your health care provider.   Document Released: 01/21/2001 Document Revised: 06/13/2014 Document Reviewed: 06/13/2014 Elsevier Interactive Patient Education 2016 [employer] Inc.  

## 2015-05-01 NOTE — Addendum Note (Signed)
Addended by: Sharee PimpleMCCHESNEY, MARILYN K on: 05/01/2015 10:23 AM   Modules accepted: Orders

## 2015-07-16 ENCOUNTER — Emergency Department (HOSPITAL_BASED_OUTPATIENT_CLINIC_OR_DEPARTMENT_OTHER): Payer: Medicare Other

## 2015-07-16 ENCOUNTER — Emergency Department (HOSPITAL_BASED_OUTPATIENT_CLINIC_OR_DEPARTMENT_OTHER)
Admission: EM | Admit: 2015-07-16 | Discharge: 2015-07-16 | Disposition: A | Discharge status: Home / Self Care | Payer: Medicare Other | Attending: Emergency Medicine | Admitting: Emergency Medicine

## 2015-07-16 ENCOUNTER — Encounter (HOSPITAL_BASED_OUTPATIENT_CLINIC_OR_DEPARTMENT_OTHER): Payer: Self-pay | Admitting: Emergency Medicine

## 2015-07-16 DIAGNOSIS — I2510 Atherosclerotic heart disease of native coronary artery without angina pectoris: Secondary | ICD-10-CM | POA: Diagnosis not present

## 2015-07-16 DIAGNOSIS — I10 Essential (primary) hypertension: Secondary | ICD-10-CM | POA: Insufficient documentation

## 2015-07-16 DIAGNOSIS — Z79899 Other long term (current) drug therapy: Secondary | ICD-10-CM | POA: Insufficient documentation

## 2015-07-16 DIAGNOSIS — J189 Pneumonia, unspecified organism: Secondary | ICD-10-CM | POA: Insufficient documentation

## 2015-07-16 DIAGNOSIS — J449 Chronic obstructive pulmonary disease, unspecified: Secondary | ICD-10-CM | POA: Insufficient documentation

## 2015-07-16 DIAGNOSIS — I252 Old myocardial infarction: Secondary | ICD-10-CM | POA: Insufficient documentation

## 2015-07-16 DIAGNOSIS — E7800 Pure hypercholesterolemia, unspecified: Secondary | ICD-10-CM | POA: Insufficient documentation

## 2015-07-16 DIAGNOSIS — Z7982 Long term (current) use of aspirin: Secondary | ICD-10-CM | POA: Diagnosis not present

## 2015-07-16 DIAGNOSIS — F17210 Nicotine dependence, cigarettes, uncomplicated: Secondary | ICD-10-CM | POA: Diagnosis not present

## 2015-07-16 DIAGNOSIS — R079 Chest pain, unspecified: Secondary | ICD-10-CM

## 2015-07-16 LAB — CBC
HEMATOCRIT: 36.9 % (ref 36.0–46.0)
Hemoglobin: 11.7 g/dL — ABNORMAL LOW (ref 12.0–15.0)
MCH: 32.4 pg (ref 26.0–34.0)
MCHC: 31.7 g/dL (ref 30.0–36.0)
MCV: 102.2 fL — ABNORMAL HIGH (ref 78.0–100.0)
Platelets: 187 10*3/uL (ref 150–400)
RBC: 3.61 MIL/uL — ABNORMAL LOW (ref 3.87–5.11)
RDW: 14.5 % (ref 11.5–15.5)
WBC: 8.3 10*3/uL (ref 4.0–10.5)

## 2015-07-16 LAB — BASIC METABOLIC PANEL
ANION GAP: 11 (ref 5–15)
BUN: 41 mg/dL — AB (ref 6–20)
CALCIUM: 9.6 mg/dL (ref 8.9–10.3)
CO2: 29 mmol/L (ref 22–32)
Chloride: 103 mmol/L (ref 101–111)
Creatinine, Ser: 1.11 mg/dL — ABNORMAL HIGH (ref 0.44–1.00)
GFR calc Af Amer: 54 mL/min — ABNORMAL LOW (ref >60)
GFR, EST NON AFRICAN AMERICAN: 46 mL/min — AB (ref >60)
Glucose, Bld: 98 mg/dL (ref 65–99)
POTASSIUM: 4 mmol/L (ref 3.5–5.1)
SODIUM: 143 mmol/L (ref 135–145)

## 2015-07-16 LAB — PROTIME-INR
INR: 1.03 (ref 0.00–1.49)
PROTHROMBIN TIME: 13.7 s (ref 11.6–15.2)

## 2015-07-16 LAB — BRAIN NATRIURETIC PEPTIDE: B Natriuretic Peptide: 462.7 pg/mL — ABNORMAL HIGH (ref 0.0–100.0)

## 2015-07-16 LAB — TROPONIN I

## 2015-07-16 MED ORDER — ASPIRIN 81 MG PO CHEW
324.0000 mg | CHEWABLE_TABLET | Freq: Once | ORAL | Status: AC
  Administered 2015-07-16: 324 mg via ORAL
  Filled 2015-07-16: qty 4

## 2015-07-16 MED ORDER — DOXYCYCLINE HYCLATE 100 MG PO TABS
100.0000 mg | ORAL_TABLET | Freq: Two times a day (BID) | ORAL | Status: DC
Start: 2015-07-16 — End: 2015-09-30

## 2015-07-16 MED FILL — DOXYCYCLINE 100 MG TABLET: 100 | 10 days supply | Qty: 20 | Fill #0

## 2015-07-16 NOTE — ED Notes (Signed)
MD at bedside. 

## 2015-07-16 NOTE — ED Provider Notes (Signed)
CSN: [id number]     Arrival date & time 07/16/15  1121 History   First MD Initiated Contact with Patient 07/16/15 1129     Chief Complaint  Patient presents with  . Chest Pain   HPI Pt has history of CAD, CABG.  She started having pain in the center of her chest that goes to her back.  The sx started when she got out of bed.  It feels like a pressure.  She thinks she might have fluid up.  It is constant and has been there all morning.  She has had shortness of breath. No fevers.  She has been coughing.  Her legs feel more swollen.  Pt does smoke.  Last time she had any intervention on her heart was 4-5 years ago. Past Medical History  Diagnosis Date  . Hypertension   . Hypercholesterolemia   . CAD (coronary artery disease)     sig CAD previously in RCA, Cx; history of 3-4 stents and h/o CABG  . COPD (chronic obstructive pulmonary disease) (HCC)   . MI (myocardial infarction) (HCC)     history  . PVD (peripheral vascular disease) (HCC)     refused amputation in the past   . Diastolic dysfunction     echo 05/979 EF 55%, mild to mod LVH, LA mild to mod dilated  . Pneumonia    Past Surgical History  Procedure Laterality Date  . Coronary artery bypass graft    . Abdominal hysterectomy    . Tonsillectomy    . Cholecystectomy    . Appendectomy    . Other surgical history      ?heart valve replacement  . Peripheral vascular catheterization N/A 09/26/2014    Procedure: Abdominal Aortogram;  Surgeon: Nada Libman, MD;  Location: [facility];  Service: Cardiovascular;  Laterality: N/A;   Family History  Problem Relation Age of Onset  . Diabetes Mother   . Varicose Veins Mother   . Cancer Father   . Cancer Sister   . Diabetes Daughter   . Heart disease Daughter   . Hypertension Daughter   . Heart attack Daughter     before age 51  . Heart disease Son   . Hypertension Son   . Heart attack Son     before age 9   Social History  Substance Use Topics  . Smoking status:  Current Every Day Smoker -- 0.50 packs/day for 50 years    Types: Cigarettes  . Smokeless tobacco: Never Used  . Alcohol Use: No   OB History    No data available     Review of Systems  All other systems reviewed and are negative.     Allergies  Avelox and Penicillins  Home Medications   Prior to Admission medications   Medication Sig Start Date End Date Taking? Authorizing Provider  aspirin 81 MG tablet Take 81 mg by mouth daily.   Yes Historical Provider, MD  atorvastatin (LIPITOR) 80 MG tablet Take 80 mg by mouth daily.     Yes Historical Provider, MD  clopidogrel (PLAVIX) 75 MG tablet Take 75 mg by mouth daily.     Yes Historical Provider, MD  famotidine (PEPCID) 20 MG tablet Take 20 mg by mouth 2 (two) times daily.     Yes Historical Provider, MD  fluticasone (FLONASE) 50 MCG/ACT nasal spray Place 1 spray into both nostrils daily.  07/22/13  Yes Historical Provider, MD  fluticasone-salmeterol (ADVAIR HFA) 191-47 MCG/ACT inhaler Inhale  2 puffs into the lungs 2 (two) times daily.     Yes Historical Provider, MD  furosemide (LASIX) 40 MG tablet Take 40 mg by mouth 2 (two) times daily. 07/11/12  Yes Lollie SailsAndrew B Wallace, MD  gabapentin (NEURONTIN) 100 MG capsule Take 100 mg by mouth 3 (three) times daily. 07/22/13  Yes Historical Provider, MD  isosorbide dinitrate (ISORDIL) 30 MG tablet Take 30 mg by mouth daily.    Yes Historical Provider, MD  losartan (COZAAR) 50 MG tablet Take 50 mg by mouth daily.   Yes Historical Provider, MD  nebivolol (BYSTOLIC) 10 MG tablet Take 10 mg by mouth daily.   Yes Historical Provider, MD  potassium chloride SA (K-DUR,KLOR-CON) 20 MEQ tablet Take 2 tablets (40 mEq total) by mouth daily. 07/11/12  Yes Lollie SailsAndrew B Wallace, MD  PROVENTIL HFA 108 [phone](90 BASE) MCG/ACT inhaler Inhale 2 puffs into the lungs every 4 (four) hours as needed for wheezing or shortness of breath.  05/29/14  Yes Historical Provider, MD  tiotropium (SPIRIVA) 18 MCG inhalation capsule Place 18 mcg  into inhaler and inhale daily.     Yes Historical Provider, MD  doxycycline (VIBRA-TABS) 100 MG tablet Take 1 tablet (100 mg total) by mouth 2 (two) times daily. 07/16/15   Linwood DibblesJon Marquesha Robideau, MD  lisinopril (PRINIVIL,ZESTRIL) 10 MG tablet Take 1 tablet (10 mg total) by mouth daily. 07/11/12   Lollie SailsAndrew B Wallace, MD  metoprolol (TOPROL-XL) 100 MG 24 hr tablet Take 100 mg by mouth daily.      Historical Provider, MD   BP 116/30 mmHg  Pulse 54  Temp(Src) 98.6 F (37 C) (Oral)  Resp 12  Ht 5\' 1"  (1.549 m)  Wt 73.029 kg  BMI 30.44 kg/m2  SpO2 96% Physical Exam  Constitutional: No distress.  HENT:  Head: Normocephalic and atraumatic.  Right Ear: External ear normal.  Left Ear: External ear normal.  Eyes: Conjunctivae are normal. Right eye exhibits no discharge. Left eye exhibits no discharge. No scleral icterus.  Neck: Neck supple. No tracheal deviation present.  Cardiovascular: Normal rate, regular rhythm and intact distal pulses.   Pulmonary/Chest: Effort normal and breath sounds normal. No stridor. No respiratory distress. She has no wheezes. She has no rales.  Abdominal: Soft. Bowel sounds are normal. She exhibits no distension. There is no tenderness. There is no rebound and no guarding.  Musculoskeletal: She exhibits no edema or tenderness.  Neurological: She is alert. She has normal strength. No cranial nerve deficit (no facial droop, extraocular movements intact, no slurred speech) or sensory deficit. She exhibits normal muscle tone. She displays no seizure activity. Coordination normal.  Skin: Skin is warm and dry. She is not diaphoretic.  Venous stasis changes  Psychiatric: She has a normal mood and affect.  Nursing note and vitals reviewed.   ED Course  Procedures (including critical care time) Labs Review Labs Reviewed  BASIC METABOLIC PANEL - Abnormal; Notable for the following:    BUN 41 (*)    Creatinine, Ser 1.11 (*)    GFR calc non Af Amer 46 (*)    GFR calc Af Amer 54 (*)     All other components within normal limits  CBC - Abnormal; Notable for the following:    RBC 3.61 (*)    Hemoglobin 11.7 (*)    MCV 102.2 (*)    All other components within normal limits  BRAIN NATRIURETIC PEPTIDE - Abnormal; Notable for the following:    B Natriuretic Peptide 462.7 (*)  All other components within normal limits  TROPONIN I  PROTIME-INR    Imaging Review Dg Chest 2 View  07/16/2015  CLINICAL DATA:  Chest pain, chronic shortness of breath, COPD. EXAM: CHEST  2 VIEW COMPARISON:  08/31/2014 FINDINGS: Prior CABG. There is hyperinflation of the lungs compatible with COPD. Chronic scarring in the lung bases. Confluent airspace opacity in the right middle lobe concerning for pneumonia. No effusions. No acute bony abnormality. IMPRESSION: COPD/chronic changes. Confluent opacity in the right middle lobe concerning for pneumonia Electronically Signed   By: Charlett Nose M.D.   On: 07/16/2015 12:23   I have personally reviewed and evaluated these images and lab results as part of my medical decision-making.   EKG Interpretation   Date/Time:  Monday July 16 2015 11:30:56 EDT Ventricular Rate:  54 PR Interval:  135 QRS Duration: 92 QT Interval:  484 QTC Calculation: 459 R Axis:   -2 Text Interpretation:  Sinus rhythm Probable left atrial enlargement  Probable LVH with secondary repol abnrm Anterior Q waves, possibly due to  LVH lateral t wave changes noted on 20 jun2016 not present on current ECG  Confirmed by Tyquavious Gamel  MD-J, Nusaybah Ivie ([id number]) on 07/16/2015 11:38:38 AM      MDM   Final diagnoses:  CAP (community acquired pneumonia)  Chest pain, unspecified chest pain type   The patient's initial laboratory tests show an elevated BNP with a normal troponin and no evidence of pulmonary edema. Her BUN and creatinine are chronically elevated. Her anemia appears to be stable.  I am concerned about her cardiac history and the chest pain she was complaining of.  I recommend to the  patient that she be admitted to the hospital for serial cardiac enzymes. Chest x-ray also suggest the possibility of pneumonia and recommended we start her on antibiotics.  Patient states she does not want to be admitted to the hospital. I explained to her that I cannot exclude that she may be having issues with an acute blockage in one of her coronary arteries or that she could have another heart attack.  Pt understands and does not want to be admitted to the hospital.  She will call her cardiologist.  She understands to return if she has worsening symptoms.   Linwood Dibbles, MD 07/16/15 [phone]

## 2015-07-16 NOTE — Discharge Instructions (Signed)

## 2015-07-16 NOTE — ED Notes (Signed)
Pt stating after IV was placed that pain had resolved. Will continue to monitor patient.

## 2015-07-16 NOTE — ED Notes (Signed)
Sudden onset of central chest pain upon getting out of bed. No radiation. Reports increased shortness of breath, wears 3L home oxygen. A/O at triage.

## 2015-09-30 ENCOUNTER — Emergency Department (HOSPITAL_BASED_OUTPATIENT_CLINIC_OR_DEPARTMENT_OTHER): Payer: Medicare Other

## 2015-09-30 ENCOUNTER — Encounter (HOSPITAL_BASED_OUTPATIENT_CLINIC_OR_DEPARTMENT_OTHER): Payer: Self-pay | Admitting: Respiratory Therapy

## 2015-09-30 ENCOUNTER — Emergency Department (HOSPITAL_BASED_OUTPATIENT_CLINIC_OR_DEPARTMENT_OTHER)
Admission: EM | Admit: 2015-09-30 | Discharge: 2015-09-30 | Disposition: A | Discharge status: Home / Self Care | Payer: Medicare Other | Attending: Emergency Medicine | Admitting: Emergency Medicine

## 2015-09-30 DIAGNOSIS — Z79899 Other long term (current) drug therapy: Secondary | ICD-10-CM | POA: Insufficient documentation

## 2015-09-30 DIAGNOSIS — J449 Chronic obstructive pulmonary disease, unspecified: Secondary | ICD-10-CM | POA: Diagnosis not present

## 2015-09-30 DIAGNOSIS — J189 Pneumonia, unspecified organism: Secondary | ICD-10-CM

## 2015-09-30 DIAGNOSIS — M546 Pain in thoracic spine: Secondary | ICD-10-CM | POA: Insufficient documentation

## 2015-09-30 DIAGNOSIS — I10 Essential (primary) hypertension: Secondary | ICD-10-CM | POA: Insufficient documentation

## 2015-09-30 DIAGNOSIS — F17210 Nicotine dependence, cigarettes, uncomplicated: Secondary | ICD-10-CM | POA: Diagnosis not present

## 2015-09-30 DIAGNOSIS — I2510 Atherosclerotic heart disease of native coronary artery without angina pectoris: Secondary | ICD-10-CM | POA: Insufficient documentation

## 2015-09-30 DIAGNOSIS — Z7982 Long term (current) use of aspirin: Secondary | ICD-10-CM | POA: Insufficient documentation

## 2015-09-30 DIAGNOSIS — J029 Acute pharyngitis, unspecified: Secondary | ICD-10-CM | POA: Diagnosis not present

## 2015-09-30 DIAGNOSIS — R109 Unspecified abdominal pain: Secondary | ICD-10-CM | POA: Diagnosis not present

## 2015-09-30 DIAGNOSIS — R079 Chest pain, unspecified: Secondary | ICD-10-CM | POA: Diagnosis present

## 2015-09-30 LAB — TROPONIN I: Troponin I: 0.03 ng/mL (ref <0.03)

## 2015-09-30 LAB — BASIC METABOLIC PANEL
ANION GAP: 11 (ref 5–15)
BUN: 29 mg/dL — ABNORMAL HIGH (ref 6–20)
CHLORIDE: 103 mmol/L (ref 101–111)
CO2: 24 mmol/L (ref 22–32)
Calcium: 9.1 mg/dL (ref 8.9–10.3)
Creatinine, Ser: 0.9 mg/dL (ref 0.44–1.00)
GFR calc Af Amer: >60 mL/min (ref >60)
GFR calc non Af Amer: 59 mL/min — ABNORMAL LOW (ref >60)
GLUCOSE: 118 mg/dL — AB (ref 65–99)
POTASSIUM: 3.7 mmol/L (ref 3.5–5.1)
SODIUM: 138 mmol/L (ref 135–145)

## 2015-09-30 LAB — CBC WITH DIFFERENTIAL/PLATELET
Basophils Absolute: 0 10*3/uL (ref 0.0–0.1)
Basophils Relative: 0 %
EOS ABS: 0.2 10*3/uL (ref 0.0–0.7)
Eosinophils Relative: 2 %
HCT: 32.4 % — ABNORMAL LOW (ref 36.0–46.0)
HEMOGLOBIN: 10.5 g/dL — AB (ref 12.0–15.0)
LYMPHS ABS: 0.8 10*3/uL (ref 0.7–4.0)
LYMPHS PCT: 6 %
MCH: 33.1 pg (ref 26.0–34.0)
MCHC: 32.4 g/dL (ref 30.0–36.0)
MCV: 102.2 fL — ABNORMAL HIGH (ref 78.0–100.0)
Monocytes Absolute: 0.7 10*3/uL (ref 0.1–1.0)
Monocytes Relative: 5 %
NEUTROS PCT: 87 %
Neutro Abs: 11.4 10*3/uL — ABNORMAL HIGH (ref 1.7–7.7)
Platelets: 125 10*3/uL — ABNORMAL LOW (ref 150–400)
RBC: 3.17 MIL/uL — AB (ref 3.87–5.11)
RDW: 14.9 % (ref 11.5–15.5)
WBC: 13 10*3/uL — AB (ref 4.0–10.5)

## 2015-09-30 LAB — PROTIME-INR
INR: 1.15
PROTHROMBIN TIME: 14.8 s (ref 11.4–15.2)

## 2015-09-30 LAB — BRAIN NATRIURETIC PEPTIDE: B NATRIURETIC PEPTIDE 5: 486.6 pg/mL — AB (ref 0.0–100.0)

## 2015-09-30 LAB — D-DIMER, QUANTITATIVE: D-Dimer, Quant: 0.82 ug/mL-FEU — ABNORMAL HIGH (ref 0.00–0.50)

## 2015-09-30 MED ORDER — IOPAMIDOL (ISOVUE-370) INJECTION 76%
100.0000 mL | Freq: Once | INTRAVENOUS | Status: AC | PRN
  Administered 2015-09-30: 100 mL via INTRAVENOUS

## 2015-09-30 MED ORDER — IPRATROPIUM-ALBUTEROL 0.5-2.5 (3) MG/3ML IN SOLN
3.0000 mL | Freq: Once | RESPIRATORY_TRACT | Status: AC
  Administered 2015-09-30: 3 mL via RESPIRATORY_TRACT
  Filled 2015-09-30: qty 3

## 2015-09-30 MED ORDER — DOXYCYCLINE HYCLATE 100 MG PO CAPS: 100.0000 mg | ORAL_CAPSULE | Freq: Two times a day (BID) | ORAL | 0 refills | Status: DC

## 2015-09-30 NOTE — Discharge Instructions (Signed)
Take your antibiotic as prescribed until you have completed the prescription. I recommend continuing to use your home breathing treatments as prescribed as needed for shortness of breath. Continue wearing your home oxygen. Follow-up with your primary care provider in the next 3-5 days for follow-up. Please return to the Emergency Department if symptoms worsen or new onset of fever, difficulty breathing, productive cough, coughing up blood, chest pain, vomiting, abdominal pain, unable to keep fluids down.

## 2015-09-30 NOTE — ED Triage Notes (Signed)
Pt c/o upper back pain since Thursday when she woke up. This pain is causing her to have increased SOB. Pt has not had to go up on her O2 with this. Denies injury. Taking tylenol without relief.

## 2015-09-30 NOTE — ED Notes (Signed)
MD at bedside. 

## 2015-09-30 NOTE — ED Notes (Signed)
Pt on automatic VS and continuous pulse ox, as well as the cardiac monitor.

## 2015-09-30 NOTE — ED Notes (Signed)
Patient states that she is declining to be admitted to the hospital for further care.  MD in room and speaking with patient in regards to this information.  Patient will sign out AMA.  Second troponin d/c per MD due to patient wanting to leave AMA.

## 2015-09-30 NOTE — ED Provider Notes (Signed)
[facility] Provider Note   CSN: [id number] Arrival date & time: 09/30/15  [id number]     History   Chief Complaint Chief Complaint  Patient presents with  . Back Pain    HPI Belinda Werner is a 81 y.o. female.  Patient 81 year old female with past medical history of hypertension, CAD (on plavix), COPD (on 3 L oxygen at home), PVD and CHF who presents the ED with complaint of shortness of breath, onset 2 days. Patient reports over the past few days she has had worsening shortness of breath which she states is worse with ambulating. She notes she has been wearing her 3 L of home oxygen at home without relief. Patient also reports having intermittent sharp pain to her mid back and left lateral chest wall which she notes is worse with coughing or bending. Endorses associated nonproductive cough and sore throat. Denies fever, chills, headache, lightheadedness, dizziness, palpitations, abdominal pain, nausea, vomiting, numbness, tingling, weakness, leg swelling. Patient reports she has been using her home albuterol inhalers without relief. Patient reports she typically smokes 1 pack per day but notes she has only been smoking 4-5 cigarettes per day since her symptoms started. Denies any known sick contacts. She notes she was dx with pneumonia in June.      Past Medical History:  Diagnosis Date  . CAD (coronary artery disease)    sig CAD previously in RCA, Cx; history of 3-4 stents and h/o CABG  . COPD (chronic obstructive pulmonary disease) (HCC)   . Diastolic dysfunction    echo 09/2008 EF 55%, mild to mod LVH, LA mild to mod dilated  . Hypercholesterolemia   . Hypertension   . MI (myocardial infarction) (HCC)    history  . Pneumonia   . PVD (peripheral vascular disease) (HCC)    refused amputation in the past     Patient Active Problem List   Diagnosis Date Noted  . Pain of left lower leg 09/19/2014  . Swelling of limb 09/19/2014  . Peripheral vascular disease,  unspecified (HCC) 10/14/2013  . Pleural effusion 07/10/2012  . HTN (hypertension) 07/10/2012  . Acute CHF (congestive heart failure) (HCC) 07/09/2012  . HLD (hyperlipidemia) 07/09/2012  . COPD (chronic obstructive pulmonary disease) (HCC) 07/09/2012    Past Surgical History:  Procedure Laterality Date  . ABDOMINAL HYSTERECTOMY    . APPENDECTOMY    . CHOLECYSTECTOMY    . CORONARY ARTERY BYPASS GRAFT    . OTHER SURGICAL HISTORY     ?heart valve replacement  . PERIPHERAL VASCULAR CATHETERIZATION N/A 09/26/2014   Procedure: Abdominal Aortogram;  Surgeon: Nada LibmanVance W Brabham, MD;  Location: [facility];  Service: Cardiovascular;  Laterality: N/A;  . TONSILLECTOMY      OB History    No data available       Home Medications    Prior to Admission medications   Medication Sig Start Date End Date Taking? Authorizing Provider  aspirin 81 MG tablet Take 81 mg by mouth daily.    Historical Provider, MD  atorvastatin (LIPITOR) 80 MG tablet Take 80 mg by mouth daily.      Historical Provider, MD  clopidogrel (PLAVIX) 75 MG tablet Take 75 mg by mouth daily.      Historical Provider, MD  doxycycline (VIBRAMYCIN) 100 MG capsule Take 1 capsule (100 mg total) by mouth 2 (two) times daily. 09/30/15   Barrett HenleNicole Elizabeth Laymond Postle, PA-C  famotidine (PEPCID) 20 MG tablet Take 20 mg by mouth 2 (two) times daily.  Historical Provider, MD  fluticasone (FLONASE) 50 MCG/ACT nasal spray Place 1 spray into both nostrils daily.  07/22/13   Historical Provider, MD  fluticasone-salmeterol (ADVAIR HFA) 115-21 MCG/ACT inhaler Inhale 2 puffs into the lungs 2 (two) times daily.      Historical Provider, MD  furosemide (LASIX) 40 MG tablet Take 40 mg by mouth 2 (two) times daily. 07/11/12   Lollie Sails, MD  gabapentin (NEURONTIN) 100 MG capsule Take 100 mg by mouth 3 (three) times daily. 07/22/13   Historical Provider, MD  isosorbide dinitrate (ISORDIL) 30 MG tablet Take 30 mg by mouth daily.     Historical  Provider, MD  lisinopril (PRINIVIL,ZESTRIL) 10 MG tablet Take 1 tablet (10 mg total) by mouth daily. 07/11/12   Lollie Sails, MD  losartan (COZAAR) 50 MG tablet Take 50 mg by mouth daily.    Historical Provider, MD  metoprolol (TOPROL-XL) 100 MG 24 hr tablet Take 100 mg by mouth daily.      Historical Provider, MD  nebivolol (BYSTOLIC) 10 MG tablet Take 10 mg by mouth daily.    Historical Provider, MD  potassium chloride SA (K-DUR,KLOR-CON) 20 MEQ tablet Take 2 tablets (40 mEq total) by mouth daily. 07/11/12   Lollie Sails, MD  PROVENTIL HFA 108 (90 BASE) MCG/ACT inhaler Inhale 2 puffs into the lungs every 4 (four) hours as needed for wheezing or shortness of breath.  05/29/14   Historical Provider, MD  tiotropium (SPIRIVA) 18 MCG inhalation capsule Place 18 mcg into inhaler and inhale daily.      Historical Provider, MD    Family History Family History  Problem Relation Age of Onset  . Diabetes Mother   . Varicose Veins Mother   . Cancer Father   . Diabetes Daughter   . Heart disease Daughter   . Hypertension Daughter   . Heart attack Daughter     before age 25  . Heart disease Son   . Hypertension Son   . Heart attack Son     before age 33  . Cancer Sister     Social History Social History  Substance Use Topics  . Smoking status: Current Every Day Smoker    Packs/day: 0.50    Years: 50.00    Types: Cigarettes  . Smokeless tobacco: Never Used  . Alcohol use No     Allergies   Avelox [moxifloxacin hcl in nacl] and Penicillins   Review of Systems Review of Systems  HENT: Positive for sore throat.   Respiratory: Positive for cough and shortness of breath.   Cardiovascular: Positive for chest pain.  All other systems reviewed and are negative.    Physical Exam Updated Vital Signs BP (!) 110/40   Pulse 71   Temp 98.3 F (36.8 C) (Oral)   Resp 23   Ht 5\' 1"  (1.549 m)   Wt 71.7 kg   SpO2 96%   BMI 29.85 kg/m   Physical Exam  Constitutional: She is  oriented to person, place, and time. She appears well-developed and well-nourished. No distress.  Elderly appearing female  HENT:  Head: Normocephalic and atraumatic.  Mouth/Throat: Oropharynx is clear and moist. No oropharyngeal exudate.  Eyes: Conjunctivae and EOM are normal. Right eye exhibits no discharge. Left eye exhibits no discharge. No scleral icterus.  Neck: Normal range of motion. Neck supple.  Cardiovascular: Normal rate, regular rhythm, normal heart sounds and intact distal pulses.   Pulmonary/Chest: Effort normal. No respiratory distress. She has wheezes. She  has no rales. She exhibits no tenderness.  Decreased breath sounds throughout with end-expiratory wheezes noted.  Abdominal: Soft. Bowel sounds are normal. She exhibits no distension and no mass. There is no tenderness. There is no rebound and no guarding. No hernia.  Musculoskeletal: Normal range of motion. She exhibits no edema.  Neurological: She is alert and oriented to person, place, and time.  Skin: Skin is warm and dry. She is not diaphoretic.  Nursing note and vitals reviewed.    ED Treatments / Results  Labs (all labs ordered are listed, but only abnormal results are displayed) Labs Reviewed  CBC WITH DIFFERENTIAL/PLATELET - Abnormal; Notable for the following:       Result Value   WBC 13.0 (*)    RBC 3.17 (*)    Hemoglobin 10.5 (*)    HCT 32.4 (*)    MCV 102.2 (*)    Platelets 125 (*)    Neutro Abs 11.4 (*)    All other components within normal limits  BASIC METABOLIC PANEL - Abnormal; Notable for the following:    Glucose, Bld 118 (*)    BUN 29 (*)    GFR calc non Af Amer 59 (*)    All other components within normal limits  TROPONIN I - Abnormal; Notable for the following:    Troponin I 0.03 (*)    All other components within normal limits  D-DIMER, QUANTITATIVE (NOT AT [facility]) - Abnormal; Notable for the following:    D-Dimer, Quant 0.82 (*)    All other components within normal limits  BRAIN  NATRIURETIC PEPTIDE - Abnormal; Notable for the following:    B Natriuretic Peptide 486.6 (*)    All other components within normal limits  PROTIME-INR    EKG  EKG Interpretation  Date/Time:  Sunday September 30 2015 10:08:22 EDT Ventricular Rate:  70 PR Interval:    QRS Duration: 93 QT Interval:  432 QTC Calculation: 467 R Axis:   -30 Text Interpretation:  Sinus rhythm Short PR interval LVH with secondary repolarization abnormality Inferior infarct, old No significant change was found Confirmed by Manus Gunning  MD, STEPHEN [phone]) on 09/30/2015 11:10:47 AM       Radiology Dg Chest 2 View  Result Date: 09/30/2015 CLINICAL DATA:  Cough, shortness of Breath EXAM: CHEST  2 VIEW COMPARISON:  08/02/2015 FINDINGS: Cardiomegaly again noted. Status post CABG. Two leads cardiac pacemaker is unchanged in position. There is streaky atelectasis or infiltrate right midlung. Small right pleural effusion with right basilar atelectasis or infiltrate. Osteopenia and mild degenerative changes thoracic spine. IMPRESSION: Cardiomegaly. Streaky atelectasis or infiltrate right midlung. Small right pleural effusion with right basilar atelectasis or infiltrate. No pulmonary edema. 2 leads cardiac pacemaker is unchanged in position. Electronically Signed   By: Natasha Mead M.D.   On: 09/30/2015 11:29   Ct Angio Chest Aorta W/cm &/or Wo/cm  Result Date: 09/30/2015 CLINICAL DATA:  Chest pain, cough, upper back pain and elevated D-dimer. History of peripheral vascular disease with prior bilateral iliac stent placement. EXAM: CT ANGIOGRAPHY CHEST, ABDOMEN AND PELVIS TECHNIQUE: Multidetector CT imaging through the chest, abdomen and pelvis was performed using the standard protocol during bolus administration of intravenous contrast. Multiplanar reconstructed images and MIPs were obtained and reviewed to evaluate the vascular anatomy. CONTRAST:  100 mL Isovue 370 IV COMPARISON:  Runoff CTA on 06/24/2002 FINDINGS: CTA CHEST  FINDINGS Cardiovascular: The thoracic aorta is normal in caliber and demonstrates calcified atherosclerotic plaque without evidence of aneurysm or dissection. There  is calcified plaque at the origin of the left subclavian artery likely causing significant stenosis of 70- 80%. There is evidence prior CABG. The heart is mildly enlarged. No pericardial fluid identified. Mediastinum/Nodes: Stable prominent lower right peritracheal lymph node measures 2 cm. Stable mildly prominent sub- carinal lymph node. Prominent right hilar nodes are likely reactive based on pulmonary airspace disease. Lungs/Pleura: New pulmonary airspace disease in the right middle lobe and right lower lobe is consistent with pneumonia. Stable scarring in the lingula. No masses, edema, pleural fluid or pneumothorax. Musculoskeletal: Osteopenic compression fractures of T5 and T6. Review of the MIP images confirms the above findings. CTA ABDOMEN AND PELVIS FINDINGS Vascular: There is significant atherosclerosis of the abdominal aorta and visceral arteries. Mild dilatation of the infrarenal aorta relative to the proximal aorta measures 2.5 cm in greatest diameter. Calcified plaque causes approximately 60- 70% stenosis of the proximal celiac axis. Calcified plaque causes approximately 70-80% stenosis of the proximal superior mesenteric artery. The inferior mesenteric artery is tiny and atretic and may be subtotally occluded. In the infrarenal aorta, a vascular stent is present which extends to abut the orifice of the left common iliac artery. There does appear to be patent flow through the stent and this has the appearance of a malpositioned stent. No thrombus is seen surrounding the stent or within the distal aorta. Bilateral proximal common iliac artery stents are patent. Left-sided external iliac artery stent is patent. Diffuse disease of the right external iliac artery present with maximal narrowing of approximately 70- 75%. The native distal left  external iliac artery and common femoral artery demonstrate diffuse disease with maximal narrowing of approximately 50- 60%. Hepatobiliary: The liver shows morphology potentially of early cirrhosis with left lobe enlargement. The gallbladder appears to have been removed. Pancreas: The pancreas is unremarkable. Spleen: The spleen appears mildly enlarged. Adrenals/Urinary Tract: Adrenal glands and kidneys have a normal appearance without evidence of masses. The bladder is unremarkable. Stomach/Bowel: Bowel shows no evidence of obstruction or inflammation. No abscess or free air identified. Lymphatic: No enlarged lymph nodes are seen in the abdomen or pelvis. Reproductive: Status post hysterectomy. Other: No hernias identified. Musculoskeletal: No bony abnormalities. Review of the MIP images confirms the above findings. IMPRESSION: 1. CTA of the chest demonstrates right middle lobe and right lower lobe pneumonia. 2. Stable enlarged right lower paratracheal lymph node. Prominent lymph nodes in the right hilum may be reactive on the basis of pneumonia. 3. Probable significant stenosis at the origin of the left subclavian artery approaching 70- 80% in estimated caliber. 4. T5 and T6 osteopenic compression fractures. 5. Atherosclerosis of the abdominal aorta and visceral branch arteries with estimated 60- 70% stenosis at the origin of the celiac axis and 70- 80% stenosis at the origin of the superior mesenteric artery. The inferior mesenteric artery appears subtotally occluded. 6. Patent stent in the distal aorta appears to be a mouth deployed stent based on caliber and extends to the origin of the right common iliac artery. There is no evidence of significant thrombus in or around this stent. 7. Significant atherosclerosis of bilateral native iliac arteries with patent previously placed bilateral common iliac artery stents and left external iliac artery stent. 8. Probable early cirrhotic changes of the liver. Associated  mild splenomegaly. Electronically Signed   By: Irish Lack M.D.   On: 09/30/2015 15:33   Ct Angio Abd/pel W And/or Wo Contrast  Result Date: 09/30/2015 CLINICAL DATA:  Chest pain, cough, upper back pain and elevated D-dimer.  History of peripheral vascular disease with prior bilateral iliac stent placement. EXAM: CT ANGIOGRAPHY CHEST, ABDOMEN AND PELVIS TECHNIQUE: Multidetector CT imaging through the chest, abdomen and pelvis was performed using the standard protocol during bolus administration of intravenous contrast. Multiplanar reconstructed images and MIPs were obtained and reviewed to evaluate the vascular anatomy. CONTRAST:  100 mL Isovue 370 IV COMPARISON:  Runoff CTA on 06/24/2002 FINDINGS: CTA CHEST FINDINGS Cardiovascular: The thoracic aorta is normal in caliber and demonstrates calcified atherosclerotic plaque without evidence of aneurysm or dissection. There is calcified plaque at the origin of the left subclavian artery likely causing significant stenosis of 70- 80%. There is evidence prior CABG. The heart is mildly enlarged. No pericardial fluid identified. Mediastinum/Nodes: Stable prominent lower right peritracheal lymph node measures 2 cm. Stable mildly prominent sub- carinal lymph node. Prominent right hilar nodes are likely reactive based on pulmonary airspace disease. Lungs/Pleura: New pulmonary airspace disease in the right middle lobe and right lower lobe is consistent with pneumonia. Stable scarring in the lingula. No masses, edema, pleural fluid or pneumothorax. Musculoskeletal: Osteopenic compression fractures of T5 and T6. Review of the MIP images confirms the above findings. CTA ABDOMEN AND PELVIS FINDINGS Vascular: There is significant atherosclerosis of the abdominal aorta and visceral arteries. Mild dilatation of the infrarenal aorta relative to the proximal aorta measures 2.5 cm in greatest diameter. Calcified plaque causes approximately 60- 70% stenosis of the proximal celiac  axis. Calcified plaque causes approximately 70-80% stenosis of the proximal superior mesenteric artery. The inferior mesenteric artery is tiny and atretic and may be subtotally occluded. In the infrarenal aorta, a vascular stent is present which extends to abut the orifice of the left common iliac artery. There does appear to be patent flow through the stent and this has the appearance of a malpositioned stent. No thrombus is seen surrounding the stent or within the distal aorta. Bilateral proximal common iliac artery stents are patent. Left-sided external iliac artery stent is patent. Diffuse disease of the right external iliac artery present with maximal narrowing of approximately 70- 75%. The native distal left external iliac artery and common femoral artery demonstrate diffuse disease with maximal narrowing of approximately 50- 60%. Hepatobiliary: The liver shows morphology potentially of early cirrhosis with left lobe enlargement. The gallbladder appears to have been removed. Pancreas: The pancreas is unremarkable. Spleen: The spleen appears mildly enlarged. Adrenals/Urinary Tract: Adrenal glands and kidneys have a normal appearance without evidence of masses. The bladder is unremarkable. Stomach/Bowel: Bowel shows no evidence of obstruction or inflammation. No abscess or free air identified. Lymphatic: No enlarged lymph nodes are seen in the abdomen or pelvis. Reproductive: Status post hysterectomy. Other: No hernias identified. Musculoskeletal: No bony abnormalities. Review of the MIP images confirms the above findings. IMPRESSION: 1. CTA of the chest demonstrates right middle lobe and right lower lobe pneumonia. 2. Stable enlarged right lower paratracheal lymph node. Prominent lymph nodes in the right hilum may be reactive on the basis of pneumonia. 3. Probable significant stenosis at the origin of the left subclavian artery approaching 70- 80% in estimated caliber. 4. T5 and T6 osteopenic compression  fractures. 5. Atherosclerosis of the abdominal aorta and visceral branch arteries with estimated 60- 70% stenosis at the origin of the celiac axis and 70- 80% stenosis at the origin of the superior mesenteric artery. The inferior mesenteric artery appears subtotally occluded. 6. Patent stent in the distal aorta appears to be a mouth deployed stent based on caliber and extends to the origin  of the right common iliac artery. There is no evidence of significant thrombus in or around this stent. 7. Significant atherosclerosis of bilateral native iliac arteries with patent previously placed bilateral common iliac artery stents and left external iliac artery stent. 8. Probable early cirrhotic changes of the liver. Associated mild splenomegaly. Electronically Signed   By: Irish Lack M.D.   On: 09/30/2015 15:33    Procedures Procedures (including critical care time)  Medications Ordered in ED Medications  ipratropium-albuterol (DUONEB) 0.5-2.5 (3) MG/3ML nebulizer solution 3 mL (3 mLs Nebulization Given 09/30/15 1209)  iopamidol (ISOVUE-370) 76 % injection 100 mL (100 mLs Intravenous Contrast Given 09/30/15 1346)     Initial Impression / Assessment and Plan / ED Course  I have reviewed the triage vital signs and the nursing notes.  Pertinent labs & imaging results that were available during my care of the patient were reviewed by me and considered in my medical decision making (see chart for details).  Clinical Course    Pt presents with mid back and left sided CP with associated SOB for the past 2 days. Hx of CAD, COPD (on 3L oxygen at home), PVD and CHF. Denies fever. VSS. Exam revealed decreased breath sounds throughout with mild end-expiratory wheezes noted bilaterally, mild increased work of breathing noted which pt reports is her baseline. Pt given duoneb tx in the ED.  EKG showed sinus rhythm without any acute ischemic changes. Initial trop mildly elevated at 0.03. WBC 13. D-dimer 0.82. BNP  486. CXR showed cardiomegaly with atelecatasis vs infiltrate in right mid lung and small right pleural effusion. Labs otherwise at pt's basleine values. Discussed case with Dr. Manus Gunning who evaluated the pt. Will order CT chest for further evaluation due to concern for dissection.  On reevaluation pt denies having any SOB and notes her CP has improved. VSS, O2 stable at 97% on 3L [place]. CTA showed right middle and lower lobe pneumonia, and atherosclerosis of abdominal aorta and visceral branch arteries. Due to pt's mildly elevated trop, cardiac hx and CP I feel that the pt should be admitted for tx of pneumonia and further cardiac workup. Discussed results and plan with pt. I had a long discussed with the pt and family member regarding my concern and recommendation for admission however pt stated multiple times that she did not want to be admitted. Dr. Manus Gunning also spoke with the pt who continued to refuse admission. Pt reports understanding of my concern regarding leaving AMA however she continues to refuse admission. Plan to d/c pt home with Doxycycline for pneumonia and close follow up with her PCP and cardiologist. Discussed strict return precautions with pt.   Final Clinical Impressions(s) / ED Diagnoses   Final diagnoses:  Chest pain  Community acquired pneumonia    New Prescriptions Discharge Medication List as of 09/30/2015  4:34 PM    START taking these medications   Details  doxycycline (VIBRAMYCIN) 100 MG capsule Take 1 capsule (100 mg total) by mouth 2 (two) times daily., Starting Sun 09/30/2015, Print         [place], PA-C 09/30/15 1656    Glynn Octave, MD 10/03/15 [phone]

## 2015-11-19 ENCOUNTER — Ambulatory Visit: Payer: Medicare Other | Admitting: Surgery

## 2015-11-19 ENCOUNTER — Ambulatory Visit (INDEPENDENT_AMBULATORY_CARE_PROVIDER_SITE_OTHER)
Admission: RE | Admit: 2015-11-19 | Discharge: 2015-11-19 | Disposition: A | Discharge status: Home / Self Care | Payer: Medicare Other | Source: Ambulatory Visit | Attending: Family | Admitting: Family

## 2015-11-19 ENCOUNTER — Ambulatory Visit (HOSPITAL_COMMUNITY)
Admission: RE | Admit: 2015-11-19 | Discharge: 2015-11-19 | Disposition: A | Discharge status: Home / Self Care | Payer: Medicare Other | Source: Ambulatory Visit | Attending: Vascular Surgery | Admitting: Vascular Surgery

## 2015-11-19 DIAGNOSIS — I6523 Occlusion and stenosis of bilateral carotid arteries: Secondary | ICD-10-CM | POA: Diagnosis not present

## 2015-11-19 DIAGNOSIS — Z9889 Other specified postprocedural states: Secondary | ICD-10-CM

## 2015-11-19 DIAGNOSIS — Z95828 Presence of other vascular implants and grafts: Secondary | ICD-10-CM

## 2015-11-19 DIAGNOSIS — F17200 Nicotine dependence, unspecified, uncomplicated: Secondary | ICD-10-CM | POA: Diagnosis not present

## 2015-11-19 DIAGNOSIS — I779 Disorder of arteries and arterioles, unspecified: Secondary | ICD-10-CM | POA: Insufficient documentation

## 2015-11-29 ENCOUNTER — Encounter: Payer: Self-pay | Admitting: Surgery

## 2015-12-03 ENCOUNTER — Telehealth: Payer: Self-pay | Admitting: Surgery

## 2015-12-03 ENCOUNTER — Ambulatory Visit: Payer: Medicare Other | Admitting: Surgery

## 2015-12-03 NOTE — Telephone Encounter (Signed)
Patient came into today for a follow up appointment. I alerted the patient that Dr. Myra GianottiBrabham was running about 45 minutes behind schedule. The patient decided to cancel her appointment since she was on a limited time frame and only requested to have her lab results (from 10/09) read over the phone to her. I spoke with both Joyce GrossKay and Olegario MessierKathy to confirm this.

## 2015-12-17 ENCOUNTER — Telehealth: Payer: Self-pay

## 2015-12-17 NOTE — Telephone Encounter (Signed)
Phone call to pt.  The pt. had vascular studies done 11/19/15.  Per Dr. Myra GianottiBrabham, the studies all look stable, compared to the previous, and pt. should f/u in 6 mos. with ABI's, Aorto-Iliac Duplex, and Carotid Duplex.  Pt. made aware of recommendations, and that the Appt. Scheduler will contact her to give appt. Information.    Pt. Advised to call office if symptoms worsen prior to follow-up.  Verb. Understanding.

## 2015-12-18 ENCOUNTER — Other Ambulatory Visit: Payer: Self-pay

## 2015-12-18 DIAGNOSIS — I739 Peripheral vascular disease, unspecified: Secondary | ICD-10-CM

## 2015-12-18 DIAGNOSIS — I6523 Occlusion and stenosis of bilateral carotid arteries: Secondary | ICD-10-CM

## 2015-12-18 DIAGNOSIS — Z959 Presence of cardiac and vascular implant and graft, unspecified: Secondary | ICD-10-CM

## 2015-12-18 DIAGNOSIS — Z48812 Encounter for surgical aftercare following surgery on the circulatory system: Secondary | ICD-10-CM

## 2016-06-23 ENCOUNTER — Encounter (HOSPITAL_COMMUNITY): Payer: Medicare Other

## 2016-06-23 ENCOUNTER — Ambulatory Visit: Payer: Medicare Other | Admitting: Surgery

## 2016-08-27 ENCOUNTER — Encounter (HOSPITAL_COMMUNITY): Payer: Medicare Other

## 2016-08-27 ENCOUNTER — Ambulatory Visit: Payer: Medicare Other | Admitting: Family

## 2016-11-06 ENCOUNTER — Ambulatory Visit (INDEPENDENT_AMBULATORY_CARE_PROVIDER_SITE_OTHER)
Admission: RE | Admit: 2016-11-06 | Discharge: 2016-11-06 | Disposition: A | Discharge status: Home / Self Care | Payer: Medicare Other | Source: Ambulatory Visit | Attending: Surgery | Admitting: Surgery

## 2016-11-06 ENCOUNTER — Ambulatory Visit (INDEPENDENT_AMBULATORY_CARE_PROVIDER_SITE_OTHER): Payer: Medicare Other | Admitting: Family

## 2016-11-06 ENCOUNTER — Encounter: Payer: Self-pay | Admitting: Family

## 2016-11-06 ENCOUNTER — Ambulatory Visit (HOSPITAL_COMMUNITY)
Admission: RE | Admit: 2016-11-06 | Discharge: 2016-11-06 | Disposition: A | Discharge status: Home / Self Care | Payer: Medicare Other | Source: Ambulatory Visit | Attending: Surgery | Admitting: Surgery

## 2016-11-06 VITALS — BP 90/58 | HR 80 | Temp 97.3°F | Resp 20 | Ht 61.0 in | Wt 154.0 lb

## 2016-11-06 DIAGNOSIS — Z48812 Encounter for surgical aftercare following surgery on the circulatory system: Secondary | ICD-10-CM | POA: Diagnosis not present

## 2016-11-06 DIAGNOSIS — I6523 Occlusion and stenosis of bilateral carotid arteries: Secondary | ICD-10-CM

## 2016-11-06 DIAGNOSIS — F17200 Nicotine dependence, unspecified, uncomplicated: Secondary | ICD-10-CM | POA: Diagnosis not present

## 2016-11-06 DIAGNOSIS — Z9889 Other specified postprocedural states: Secondary | ICD-10-CM | POA: Diagnosis not present

## 2016-11-06 DIAGNOSIS — Z959 Presence of cardiac and vascular implant and graft, unspecified: Secondary | ICD-10-CM | POA: Diagnosis not present

## 2016-11-06 DIAGNOSIS — I739 Peripheral vascular disease, unspecified: Secondary | ICD-10-CM

## 2016-11-06 DIAGNOSIS — I779 Disorder of arteries and arterioles, unspecified: Secondary | ICD-10-CM | POA: Diagnosis not present

## 2016-11-06 DIAGNOSIS — B351 Tinea unguium: Secondary | ICD-10-CM

## 2016-11-06 DIAGNOSIS — Z95828 Presence of other vascular implants and grafts: Secondary | ICD-10-CM

## 2016-11-06 DIAGNOSIS — L602 Onychogryphosis: Secondary | ICD-10-CM | POA: Diagnosis not present

## 2016-11-06 LAB — VAS US CAROTID
LEFT ECA DIAS: -10 cm/s
LEFT VERTEBRAL DIAS: 12 cm/s
LICAPDIAS: -26 cm/s
Left CCA dist dias: 20 cm/s
Left CCA dist sys: 113 cm/s
Left CCA prox dias: 29 cm/s
Left CCA prox sys: 149 cm/s
Left ICA dist dias: -14 cm/s
Left ICA dist sys: -84 cm/s
Left ICA prox sys: -145 cm/s
RCCAPDIAS: 11 cm/s
RIGHT CCA MID DIAS: 19 cm/s
RIGHT VERTEBRAL DIAS: 10 cm/s
Right CCA prox sys: 66 cm/s
Right cca dist sys: -67 cm/s

## 2016-11-06 NOTE — Progress Notes (Signed)
[facility] [place] HISTORY AND PHYSICAL   MRN : [id number]  History of Present Illness:   Tajanae Guilbault is a 82 y.o. female patient of Dr. Myra Gianotti who is s/p angiography on 09/26/2014 where she had stenting of her left common and external iliac artery with a 8 x 60 Cordis self-expanding stent. Preintervention the stenosis was 80%.Preintervention she had bumped her left leg a month prior having developed a sore with blisters.   She has an extensive vascular history, mostly done at [facility]. She is status post left carotid stenting in 2009. She had a right carotid endarterectomy in 2003. She has had iliac stenting performed as well with repeat dilatation. She has a history of coronary artery disease and has had multiple percutaneous stents. She is long term smoker, has severe COPD, uses supplemental oxygen.  She denies any known history of stroke or TIA.   She admits not walking much, but with walking from her chair to the restroom her daughter states is about 15 feet, and her legs feel heavy doing this.   She had an MI, then CABG about 2004.  Pt Diabetic: No Pt smoker: smoker (1/2 ppd, over 50 yrs)  Pt meds include: Statin :Yes Betablocker: Yes ASA: Yes Other anticoagulants/antiplatelets: Plavix   Current Outpatient Prescriptions  Medication Sig Dispense Refill  . aspirin 81 MG tablet Take 81 mg by mouth daily.    Marland Kitchen atorvastatin (LIPITOR) 80 MG tablet Take 80 mg by mouth daily.      . clopidogrel (PLAVIX) 75 MG tablet Take 75 mg by mouth daily.      . famotidine (PEPCID) 20 MG tablet Take 20 mg by mouth 2 (two) times daily.      . fluticasone (FLONASE) 50 MCG/ACT nasal spray Place 1 spray into both nostrils daily.     . fluticasone-salmeterol (ADVAIR HFA) 115-21 MCG/ACT inhaler Inhale 2 puffs into the lungs 2 (two) times daily.      . furosemide (LASIX) 40 MG tablet Take 40 mg by mouth 2 (two) times daily.    Marland Kitchen gabapentin (NEURONTIN) 100 MG  capsule Take 100 mg by mouth 3 (three) times daily.    . isosorbide dinitrate (ISORDIL) 30 MG tablet Take 30 mg by mouth daily.     Marland Kitchen lisinopril (PRINIVIL,ZESTRIL) 10 MG tablet Take 1 tablet (10 mg total) by mouth daily. 30 tablet 0  . losartan (COZAAR) 50 MG tablet Take 50 mg by mouth daily.    . metoprolol (TOPROL-XL) 100 MG 24 hr tablet Take 100 mg by mouth daily.      . nebivolol (BYSTOLIC) 10 MG tablet Take 10 mg by mouth daily.    . potassium chloride SA (K-DUR,KLOR-CON) 20 MEQ tablet Take 2 tablets (40 mEq total) by mouth daily. 60 tablet 0  . PROVENTIL HFA 108 (90 BASE) MCG/ACT inhaler Inhale 2 puffs into the lungs every 4 (four) hours as needed for wheezing or shortness of breath.     . tiotropium (SPIRIVA) 18 MCG inhalation capsule Place 18 mcg into inhaler and inhale daily.      Marland Kitchen doxycycline (VIBRAMYCIN) 100 MG capsule Take 1 capsule (100 mg total) by mouth 2 (two) times daily. (Patient not taking: Reported on 11/06/2016) 20 capsule 0   No current facility-administered medications for this visit.     Past Medical History:  Diagnosis Date  . CAD (coronary artery disease)    sig CAD previously in RCA, Cx; history of 3-4 stents and h/o CABG  .  COPD (chronic obstructive pulmonary disease) (HCC)   . Diastolic dysfunction    echo 09/2008 EF 55%, mild to mod LVH, LA mild to mod dilated  . Hypercholesterolemia   . Hypertension   . MI (myocardial infarction) (HCC)    history  . Pneumonia   . PVD (peripheral vascular disease) (HCC)    refused amputation in the past     Social History Social History  Substance Use Topics  . Smoking status: Current Every Day Smoker    Packs/day: 0.50    Years: 50.00    Types: Cigarettes  . Smokeless tobacco: Never Used  . Alcohol use No    Family History Family History  Problem Relation Age of Onset  . Diabetes Mother   . Varicose Veins Mother   . Cancer Father   . Diabetes Daughter   . Heart disease Daughter   . Hypertension Daughter    . Heart attack Daughter        before age 58  . Heart disease Son   . Hypertension Son   . Heart attack Son        before age 9  . Cancer Sister     Surgical History Past Surgical History:  Procedure Laterality Date  . ABDOMINAL HYSTERECTOMY    . APPENDECTOMY    . CHOLECYSTECTOMY    . CORONARY ARTERY BYPASS GRAFT    . OTHER SURGICAL HISTORY     ?heart valve replacement  . PERIPHERAL VASCULAR CATHETERIZATION N/A 09/26/2014   Procedure: Abdominal Aortogram;  Surgeon: Nada Libman, MD;  Location: [facility];  Service: Cardiovascular;  Laterality: N/A;  . TONSILLECTOMY      Allergies  Allergen Reactions  . Avelox [Moxifloxacin Hcl In Nacl] Hives and Other (See Comments)    hives  . Penicillins Hives    Current Outpatient Prescriptions  Medication Sig Dispense Refill  . aspirin 81 MG tablet Take 81 mg by mouth daily.    Marland Kitchen atorvastatin (LIPITOR) 80 MG tablet Take 80 mg by mouth daily.      . clopidogrel (PLAVIX) 75 MG tablet Take 75 mg by mouth daily.      . famotidine (PEPCID) 20 MG tablet Take 20 mg by mouth 2 (two) times daily.      . fluticasone (FLONASE) 50 MCG/ACT nasal spray Place 1 spray into both nostrils daily.     . fluticasone-salmeterol (ADVAIR HFA) 115-21 MCG/ACT inhaler Inhale 2 puffs into the lungs 2 (two) times daily.      . furosemide (LASIX) 40 MG tablet Take 40 mg by mouth 2 (two) times daily.    Marland Kitchen gabapentin (NEURONTIN) 100 MG capsule Take 100 mg by mouth 3 (three) times daily.    . isosorbide dinitrate (ISORDIL) 30 MG tablet Take 30 mg by mouth daily.     Marland Kitchen lisinopril (PRINIVIL,ZESTRIL) 10 MG tablet Take 1 tablet (10 mg total) by mouth daily. 30 tablet 0  . losartan (COZAAR) 50 MG tablet Take 50 mg by mouth daily.    . metoprolol (TOPROL-XL) 100 MG 24 hr tablet Take 100 mg by mouth daily.      . nebivolol (BYSTOLIC) 10 MG tablet Take 10 mg by mouth daily.    . potassium chloride SA (K-DUR,KLOR-CON) 20 MEQ tablet Take 2 tablets (40 mEq total)  by mouth daily. 60 tablet 0  . PROVENTIL HFA 108 (90 BASE) MCG/ACT inhaler Inhale 2 puffs into the lungs every 4 (four) hours as needed for wheezing or shortness of  breath.     . tiotropium (SPIRIVA) 18 MCG inhalation capsule Place 18 mcg into inhaler and inhale daily.      Marland Kitchen doxycycline (VIBRAMYCIN) 100 MG capsule Take 1 capsule (100 mg total) by mouth 2 (two) times daily. (Patient not taking: Reported on 11/06/2016) 20 capsule 0   No current facility-administered medications for this visit.      REVIEW OF SYSTEMS: See HPI for pertinent positives and negatives.  Physical Examination Vitals:   11/06/16 1137 11/06/16 1144  BP: 126/63 (!) 90/58  Pulse: 80   Resp: 20   Temp: (!) 97.3 F (36.3 C)   TempSrc: Oral   SpO2: 93%   Weight: 154 lb (69.9 kg)   Height:  (1.549 m)    Body mass index is 29.1 kg/m.  General: Chronically ill appearing female in NAD Gait: slow, deliberate HENT: WNL Eyes: Pupils equal Pulmonary: mildly labored breathing at rest, limited air movement in all fields, no rales or rhonchi,+wheezing. Supplemental oxygen by nasal canula/portable tank in place. Cardiac: RRR, + murmur  Abdomen: softly obese, NT, no masses palpated  Skin: no rashes, no ulcers, no cellulitis. Multiple ecchymotic areas on arms and legs.   VASCULAR EXAM  Carotid Bruits Right Left   Positive Positive    Abdominal aortic pulse is not palpable Radial pulses are 2+ palpable and =   VASCULAR EXAM: Extremities without ischemic changes, without Gangrene; no open wounds: lesions have healed at medial aspect left lower leg. Both feet are moderately dusky, dry skin.     LE Pulses Right Left   FEMORAL faintly palpable 2+ palpable    POPLITEAL not palpable  not palpable   POSTERIOR  TIBIAL not palpable  not palpable   DORSALIS PEDIS  ANTERIOR TIBIAL not palpable  not palpable     Musculoskeletal: no muscle wasting or atrophy; no peripheral edema. Mild to moderate kyphosis.   Neurologic: A&O X 3; appropriate affect; SENSATION: equal in both feet to touch, MOTOR FUNCTION: 5/5 Symmetric in upper extremities, 4/5 in lower extremities, CN 2-12 intact, adequate dorsi and plantar flexion, speech is fluent/normal.    ASSESSMENT:  Belinda Werner is a 82 y.o. female who is s/p angiography on 09/26/2014 where she had stenting of her left common and external iliac artery with a 8 x 60 Cordis self-expanding stent. Preintervention the stenosis was 80%.Preintervention she had bumped her left leg a month prior having developed a sore with blisters.   She has an extensive vascular history, mostly done at [facility] She is status post left carotid stenting in 2009. She had a right carotid endarterectomy in 2003. She has had iliac stenting performed as well with repeat dilatation. She has a history of coronary artery disease and has had multiple percutaneous stents. She is long term smoker, has severe COPD, uses supplemental oxygen. Despite requiring supplemental oxygen, she continues to smoke.   DATA  Carotid Duplex (11/06/16): Right ICA: CEA site with 40-59% stenosis, waveforms are turbulent.  Left ICA: stent with mild disease in the proximal stent, and significant disease in the distal stent at the exit, however, EDV do not reflect severe stenosis (21-24 cm/s). No significant change compared to the exam on 11-19-15.   Bilateral Iliac Artery Stent Duplex (11/06/16): Limited visualization of the abdominal vasculature due to overlying bowel gas and patient body habitus.  Highest velocity is at the right proximal stent, 301 cm/s. No significant change compared to the exam on 11-19-15.   ABI (Date: 11/06/2016):  R:  ABI: pt declined b/p cuff due to  pain in legs(was 0.54 on 11-19-15),   PT: mono  DP: mono  TBI:  Not documented  L:   ABI:  (was 0.47),   PT: mono  DP: mono  TBI: not documented    PLAN:   Daily seated and standing leg exercises demonstrated and discussed with pt and daughter.   Podiatry referral to trim thick long toenails.  The patient was counseled re smoking cessation and given several free resources re smoking cessation.   Based on today's exam and non-invasive vascular lab results, and after discussing with Dr. Darrick Penna, the patient will follow up in 6 months with the following tests: carotid duplex, ABI's; no need to recheck iliac artery stent duplex.  I discussed in depth with the patient the nature of atherosclerosis, and emphasized the importance of maximal medical management including strict control of blood pressure, blood glucose, and lipid levels, obtaining regular exercise, and cessation of smoking.  The patient is aware that without maximal medical management the underlying atherosclerotic disease process will progress, limiting the benefit of any interventions.  The patient was given information about stroke prevention and what symptoms should prompt the patient to seek immediate medical care.  The patient was given information about PAD including signs, symptoms, treatment, what symptoms should prompt the patient to seek immediate medical care, and risk reduction measures to take.  Thank you for allowing [place] to participate in this patient's care.  Charisse March, RN, MSN, FNP-C [facility] Office: [phone]  Clinic MD: [facility] 11/06/2016 11:47 AM

## 2016-11-06 NOTE — Patient Instructions (Addendum)
Before your next abdominal ultrasound:  Take two Extra-Strength Gas-X capsules at bedtime the night before the test. Take another two Extra-Strength Gas-X capsules 3 hours before the test.  Avoid gas forming foods the day before the test.       Steps to Quit Smoking Smoking tobacco can be bad for your health. It can also affect almost every organ in your body. Smoking puts you and people around you at risk for many serious long-lasting (chronic) diseases. Quitting smoking is hard, but it is one of the best things that you can do for your health. It is never too late to quit. What are the benefits of quitting smoking? When you quit smoking, you lower your risk for getting serious diseases and conditions. They can include:  Lung cancer or lung disease.  Heart disease.  Stroke.  Heart attack.  Not being able to have children (infertility).  Weak bones (osteoporosis) and broken bones (fractures).  If you have coughing, wheezing, and shortness of breath, those symptoms may get better when you quit. You may also get sick less often. If you are pregnant, quitting smoking can help to lower your chances of having a baby of low birth weight. What can I do to help me quit smoking? Talk with your doctor about what can help you quit smoking. Some things you can do (strategies) include:  Quitting smoking totally, instead of slowly cutting back how much you smoke over a period of time.  Going to in-person counseling. You are more likely to quit if you go to many counseling sessions.  Using resources and support systems, such as: ? Online chats with a counselor. ? Phone quitlines. ? Printed self-help materials. ? Support groups or group counseling. ? Text messaging programs. ? Mobile phone apps or applications.  Taking medicines. Some of these medicines may have nicotine in them. If you are pregnant or breastfeeding, do not take any medicines to quit smoking unless your doctor says it is  okay. Talk with your doctor about counseling or other things that can help you.  Talk with your doctor about using more than one strategy at the same time, such as taking medicines while you are also going to in-person counseling. This can help make quitting easier. What things can I do to make it easier to quit? Quitting smoking might feel very hard at first, but there is a lot that you can do to make it easier. Take these steps:  Talk to your family and friends. Ask them to support and encourage you.  Call phone quitlines, reach out to support groups, or work with a counselor.  Ask people who smoke to not smoke around you.  Avoid places that make you want (trigger) to smoke, such as: ? Bars. ? Parties. ? Smoke-break areas at work.  Spend time with people who do not smoke.  Lower the stress in your life. Stress can make you want to smoke. Try these things to help your stress: ? Getting regular exercise. ? Deep-breathing exercises. ? Yoga. ? Meditating. ? Doing a body scan. To do this, close your eyes, focus on one area of your body at a time from head to toe, and notice which parts of your body are tense. Try to relax the muscles in those areas.  Download or buy apps on your mobile phone or tablet that can help you stick to your quit plan. There are many free apps, such as QuitGuide from the CDC (Centers for Disease Control and   Prevention). You can find more support from smokefree.gov and other websites.  This information is not intended to replace advice given to you by your health care provider. Make sure you discuss any questions you have with your health care provider. Document Released: 11/23/2008 Document Revised: 09/25/2015 Document Reviewed: 06/13/2014 Elsevier Interactive Patient Education  2018 Elsevier Inc.     Stroke Prevention Some medical conditions and behaviors are associated with an increased chance of having a stroke. You may prevent a stroke by making healthy  choices and managing medical conditions. How can I reduce my risk of having a stroke?  Stay physically active. Get at least 30 minutes of activity on most or all days.  Do not smoke. It may also be helpful to avoid exposure to secondhand smoke.  Limit alcohol use. Moderate alcohol use is considered to be: ? No more than 2 drinks per day for men. ? No more than 1 drink per day for nonpregnant women.  Eat healthy foods. This involves: ? Eating 5 or more servings of fruits and vegetables a day. ? Making dietary changes that address high blood pressure (hypertension), high cholesterol, diabetes, or obesity.  Manage your cholesterol levels. ? Making food choices that are high in fiber and low in saturated fat, trans fat, and cholesterol may control cholesterol levels. ? Take any prescribed medicines to control cholesterol as directed by your health care provider.  Manage your diabetes. ? Controlling your carbohydrate and sugar intake is recommended to manage diabetes. ? Take any prescribed medicines to control diabetes as directed by your health care provider.  Control your hypertension. ? Making food choices that are low in salt (sodium), saturated fat, trans fat, and cholesterol is recommended to manage hypertension. ? Ask your health care provider if you need treatment to lower your blood pressure. Take any prescribed medicines to control hypertension as directed by your health care provider. ? If you are 18-39 years of age, have your blood pressure checked every 3-5 years. If you are 40 years of age or older, have your blood pressure checked every year.  Maintain a healthy weight. ? Reducing calorie intake and making food choices that are low in sodium, saturated fat, trans fat, and cholesterol are recommended to manage weight.  Stop drug abuse.  Avoid taking birth control pills. ? Talk to your health care provider about the risks of taking birth control pills if you are over 35  years old, smoke, get migraines, or have ever had a blood clot.  Get evaluated for sleep disorders (sleep apnea). ? Talk to your health care provider about getting a sleep evaluation if you snore a lot or have excessive sleepiness.  Take medicines only as directed by your health care provider. ? For some people, aspirin or blood thinners (anticoagulants) are helpful in reducing the risk of forming abnormal blood clots that can lead to stroke. If you have the irregular heart rhythm of atrial fibrillation, you should be on a blood thinner unless there is a good reason you cannot take them. ? Understand all your medicine instructions.  Make sure that other conditions (such as anemia or atherosclerosis) are addressed. Get help right away if:  You have sudden weakness or numbness of the face, arm, or leg, especially on one side of the body.  Your face or eyelid droops to one side.  You have sudden confusion.  You have trouble speaking (aphasia) or understanding.  You have sudden trouble seeing in one or both eyes.    You have sudden trouble walking.  You have dizziness.  You have a loss of balance or coordination.  You have a sudden, severe headache with no known cause.  You have new chest pain or an irregular heartbeat. Any of these symptoms may represent a serious problem that is an emergency. Do not wait to see if the symptoms will go away. Get medical help at once. Call your local emergency services (911 in [place] Do not drive yourself to the hospital. This information is not intended to replace advice given to you by your health care provider. Make sure you discuss any questions you have with your health care provider. Document Released: 03/06/2004 Document Revised: 07/05/2015 Document Reviewed: 07/30/2012 Elsevier Interactive Patient Education  2017 Elsevier Inc.      Peripheral Vascular Disease Peripheral vascular disease (PVD) is a disease of the blood vessels that are not  part of your heart and brain. A simple term for PVD is poor circulation. In most cases, PVD narrows the blood vessels that carry blood from your heart to the rest of your body. This can result in a decreased supply of blood to your arms, legs, and internal organs, like your stomach or kidneys. However, it most often affects a person's lower legs and feet. There are two types of PVD.  Organic PVD. This is the more common type. It is caused by damage to the structure of blood vessels.  Functional PVD. This is caused by conditions that make blood vessels contract and tighten (spasm).  Without treatment, PVD tends to get worse over time. PVD can also lead to acute ischemic limb. This is when an arm or limb suddenly has trouble getting enough blood. This is a medical emergency. Follow these instructions at home:  Take medicines only as told by your doctor.  Do not use any tobacco products, including cigarettes, chewing tobacco, or electronic cigarettes. If you need help quitting, ask your doctor.  Lose weight if you are overweight, and maintain a healthy weight as told by your doctor.  Eat a diet that is low in fat and cholesterol. If you need help, ask your doctor.  Exercise regularly. Ask your doctor for some good activities for you.  Take good care of your feet. ? Wear comfortable shoes that fit well. ? Check your feet often for any cuts or sores. Contact a doctor if:  You have cramps in your legs while walking.  You have leg pain when you are at rest.  You have coldness in a leg or foot.  Your skin changes.  You are unable to get or have an erection (erectile dysfunction).  You have cuts or sores on your feet that are not healing. Get help right away if:  Your arm or leg turns cold and blue.  Your arms or legs become red, warm, swollen, painful, or numb.  You have chest pain or trouble breathing.  You suddenly have weakness in your face, arm, or leg.  You become very  confused or you cannot speak.  You suddenly have a very bad headache.  You suddenly cannot see. This information is not intended to replace advice given to you by your health care provider. Make sure you discuss any questions you have with your health care provider. Document Released: 04/23/2009 Document Revised: 07/05/2015 Document Reviewed: 07/07/2013 Elsevier Interactive Patient Education  2017 Elsevier Inc.  

## 2016-11-07 NOTE — Addendum Note (Signed)
Addended by: Burton Apley A on: 11/07/2016 10:43 AM   Modules accepted: Orders

## 2017-05-11 ENCOUNTER — Encounter (HOSPITAL_COMMUNITY): Payer: Medicare Other

## 2017-05-11 ENCOUNTER — Ambulatory Visit: Payer: Medicare Other | Admitting: Family

## 2017-05-23 ENCOUNTER — Encounter (HOSPITAL_COMMUNITY): Payer: Self-pay | Admitting: Emergency Medicine

## 2017-05-23 ENCOUNTER — Inpatient Hospital Stay (HOSPITAL_COMMUNITY)
Admission: EM | Admit: 2017-05-23 | Discharge: 2017-05-27 | DRG: 291 | Disposition: A | Discharge status: Home / Self Care | Payer: Medicare Other | Attending: Internal Medicine | Admitting: Internal Medicine

## 2017-05-23 ENCOUNTER — Emergency Department (HOSPITAL_COMMUNITY): Payer: Medicare Other

## 2017-05-23 DIAGNOSIS — I110 Hypertensive heart disease with heart failure: Principal | ICD-10-CM | POA: Diagnosis present

## 2017-05-23 DIAGNOSIS — J9690 Respiratory failure, unspecified, unspecified whether with hypoxia or hypercapnia: Secondary | ICD-10-CM | POA: Diagnosis present

## 2017-05-23 DIAGNOSIS — Z79899 Other long term (current) drug therapy: Secondary | ICD-10-CM

## 2017-05-23 DIAGNOSIS — F17210 Nicotine dependence, cigarettes, uncomplicated: Secondary | ICD-10-CM | POA: Diagnosis present

## 2017-05-23 DIAGNOSIS — J9601 Acute respiratory failure with hypoxia: Secondary | ICD-10-CM | POA: Diagnosis present

## 2017-05-23 DIAGNOSIS — Z7951 Long term (current) use of inhaled steroids: Secondary | ICD-10-CM

## 2017-05-23 DIAGNOSIS — I5033 Acute on chronic diastolic (congestive) heart failure: Secondary | ICD-10-CM

## 2017-05-23 DIAGNOSIS — Z880 Allergy status to penicillin: Secondary | ICD-10-CM

## 2017-05-23 DIAGNOSIS — K589 Irritable bowel syndrome without diarrhea: Secondary | ICD-10-CM | POA: Diagnosis present

## 2017-05-23 DIAGNOSIS — J90 Pleural effusion, not elsewhere classified: Secondary | ICD-10-CM | POA: Diagnosis present

## 2017-05-23 DIAGNOSIS — R0902 Hypoxemia: Secondary | ICD-10-CM

## 2017-05-23 DIAGNOSIS — E7800 Pure hypercholesterolemia, unspecified: Secondary | ICD-10-CM | POA: Diagnosis present

## 2017-05-23 DIAGNOSIS — I509 Heart failure, unspecified: Secondary | ICD-10-CM

## 2017-05-23 DIAGNOSIS — Z7982 Long term (current) use of aspirin: Secondary | ICD-10-CM

## 2017-05-23 DIAGNOSIS — J9621 Acute and chronic respiratory failure with hypoxia: Secondary | ICD-10-CM | POA: Diagnosis present

## 2017-05-23 DIAGNOSIS — J440 Chronic obstructive pulmonary disease with acute lower respiratory infection: Secondary | ICD-10-CM | POA: Diagnosis present

## 2017-05-23 DIAGNOSIS — I10 Essential (primary) hypertension: Secondary | ICD-10-CM | POA: Diagnosis present

## 2017-05-23 DIAGNOSIS — I252 Old myocardial infarction: Secondary | ICD-10-CM

## 2017-05-23 DIAGNOSIS — Z951 Presence of aortocoronary bypass graft: Secondary | ICD-10-CM

## 2017-05-23 DIAGNOSIS — E876 Hypokalemia: Secondary | ICD-10-CM | POA: Diagnosis not present

## 2017-05-23 DIAGNOSIS — J441 Chronic obstructive pulmonary disease with (acute) exacerbation: Secondary | ICD-10-CM | POA: Diagnosis present

## 2017-05-23 DIAGNOSIS — Z66 Do not resuscitate: Secondary | ICD-10-CM | POA: Diagnosis present

## 2017-05-23 DIAGNOSIS — I959 Hypotension, unspecified: Secondary | ICD-10-CM | POA: Diagnosis present

## 2017-05-23 DIAGNOSIS — I5043 Acute on chronic combined systolic (congestive) and diastolic (congestive) heart failure: Secondary | ICD-10-CM | POA: Diagnosis present

## 2017-05-23 DIAGNOSIS — Z950 Presence of cardiac pacemaker: Secondary | ICD-10-CM

## 2017-05-23 DIAGNOSIS — J449 Chronic obstructive pulmonary disease, unspecified: Secondary | ICD-10-CM | POA: Diagnosis present

## 2017-05-23 DIAGNOSIS — E1151 Type 2 diabetes mellitus with diabetic peripheral angiopathy without gangrene: Secondary | ICD-10-CM | POA: Diagnosis present

## 2017-05-23 DIAGNOSIS — Z8249 Family history of ischemic heart disease and other diseases of the circulatory system: Secondary | ICD-10-CM

## 2017-05-23 DIAGNOSIS — Z7901 Long term (current) use of anticoagulants: Secondary | ICD-10-CM

## 2017-05-23 DIAGNOSIS — E785 Hyperlipidemia, unspecified: Secondary | ICD-10-CM | POA: Diagnosis present

## 2017-05-23 DIAGNOSIS — R0989 Other specified symptoms and signs involving the circulatory and respiratory systems: Secondary | ICD-10-CM

## 2017-05-23 DIAGNOSIS — Z888 Allergy status to other drugs, medicaments and biological substances status: Secondary | ICD-10-CM

## 2017-05-23 DIAGNOSIS — I2510 Atherosclerotic heart disease of native coronary artery without angina pectoris: Secondary | ICD-10-CM | POA: Diagnosis present

## 2017-05-23 DIAGNOSIS — D649 Anemia, unspecified: Secondary | ICD-10-CM | POA: Diagnosis present

## 2017-05-23 HISTORY — DX: Heart failure, unspecified: I50.9

## 2017-05-23 HISTORY — DX: Dyspnea, unspecified: R06.00

## 2017-05-23 LAB — BASIC METABOLIC PANEL
Anion gap: 13 (ref 5–15)
BUN: 14 mg/dL (ref 6–20)
CALCIUM: 9.3 mg/dL (ref 8.9–10.3)
CO2: 32 mmol/L (ref 22–32)
CREATININE: 0.96 mg/dL (ref 0.44–1.00)
Chloride: 97 mmol/L — ABNORMAL LOW (ref 101–111)
GFR calc Af Amer: >60 mL/min (ref >60)
GFR calc non Af Amer: 54 mL/min — ABNORMAL LOW (ref >60)
GLUCOSE: 156 mg/dL — AB (ref 65–99)
Potassium: 3 mmol/L — ABNORMAL LOW (ref 3.5–5.1)
Sodium: 142 mmol/L (ref 135–145)

## 2017-05-23 LAB — CBC
HCT: 40.9 % (ref 36.0–46.0)
Hemoglobin: 13.4 g/dL (ref 12.0–15.0)
MCH: 34 pg (ref 26.0–34.0)
MCHC: 32.8 g/dL (ref 30.0–36.0)
MCV: 103.8 fL — ABNORMAL HIGH (ref 78.0–100.0)
PLATELETS: 174 10*3/uL (ref 150–400)
RBC: 3.94 MIL/uL (ref 3.87–5.11)
RDW: 13.6 % (ref 11.5–15.5)
WBC: 8 10*3/uL (ref 4.0–10.5)

## 2017-05-23 LAB — PROTIME-INR
INR: 1.02
Prothrombin Time: 13.3 seconds (ref 11.4–15.2)

## 2017-05-23 LAB — BRAIN NATRIURETIC PEPTIDE: B Natriuretic Peptide: 225.7 pg/mL — ABNORMAL HIGH (ref 0.0–100.0)

## 2017-05-23 LAB — I-STAT TROPONIN, ED: TROPONIN I, POC: 0.01 ng/mL (ref 0.00–0.08)

## 2017-05-23 MED ORDER — IPRATROPIUM-ALBUTEROL 0.5-2.5 (3) MG/3ML IN SOLN
3.0000 mL | Freq: Once | RESPIRATORY_TRACT | Status: AC
  Administered 2017-05-23: 3 mL via RESPIRATORY_TRACT
  Filled 2017-05-23: qty 3

## 2017-05-23 MED ORDER — MAGNESIUM SULFATE 2 GM/50ML IV SOLN
2.0000 g | INTRAVENOUS | Status: AC
  Administered 2017-05-24: 2 g via INTRAVENOUS
  Filled 2017-05-23: qty 50

## 2017-05-23 MED ORDER — FUROSEMIDE 10 MG/ML IJ SOLN
40.0000 mg | INTRAMUSCULAR | Status: AC
  Administered 2017-05-24: 40 mg via INTRAVENOUS
  Filled 2017-05-23: qty 4

## 2017-05-23 MED ORDER — POTASSIUM CHLORIDE CRYS ER 20 MEQ PO TBCR
40.0000 meq | EXTENDED_RELEASE_TABLET | Freq: Once | ORAL | Status: AC
  Administered 2017-05-23: 40 meq via ORAL
  Filled 2017-05-23: qty 2

## 2017-05-23 NOTE — ED Triage Notes (Addendum)
Patient arrived with EMS from home reports SOB with dry cough , wheezing and chest congestion this evening , she received Duoneb treatment by EMS with relief , denies chest pain , history of CHF/COPD/CAD/CABG/PACEMAKER. Still smokes cigarettes . She is on continues O2 2lpm/[place] .

## 2017-05-23 NOTE — ED Provider Notes (Signed)
[facility] EMERGENCY DEPARTMENT Provider Note   CSN: [id number] Arrival date & time: 05/23/17  2232     History   Chief Complaint Chief Complaint  Patient presents with  . Shortness of Breath    CHF/COPD    HPI Belinda Werner is a 83 y.o. female.  Patient with past medical history of CAD, COPD, hypertension, prior MI, presents to the emergency department with a chief complaint of shortness of breath.  She states that she is having dry cough, wheezing, chest congestion this evening that started around 8 PM.  She was given a breathing treatment by EMS and had significant improvement of her symptoms.  Patient wears 3 L home O2.  She denies any productive cough.  Denies any fever or chest pain.  She states that she is symptom-free now.  The history is provided by the patient. No language interpreter was used.    Past Medical History:  Diagnosis Date  . CAD (coronary artery disease)    sig CAD previously in RCA, Cx; history of 3-4 stents and h/o CABG  . COPD (chronic obstructive pulmonary disease) (HCC)   . Diastolic dysfunction    echo 09/2008 EF 55%, mild to mod LVH, LA mild to mod dilated  . Hypercholesterolemia   . Hypertension   . MI (myocardial infarction) (HCC)    history  . Pneumonia   . PVD (peripheral vascular disease) (HCC)    refused amputation in the past     Patient Active Problem List   Diagnosis Date Noted  . Pain of left lower leg 09/19/2014  . Swelling of limb 09/19/2014  . Peripheral vascular disease, unspecified (HCC) 10/14/2013  . Pleural effusion 07/10/2012  . HTN (hypertension) 07/10/2012  . Acute CHF (congestive heart failure) (HCC) 07/09/2012  . HLD (hyperlipidemia) 07/09/2012  . COPD (chronic obstructive pulmonary disease) (HCC) 07/09/2012    Past Surgical History:  Procedure Laterality Date  . ABDOMINAL HYSTERECTOMY    . APPENDECTOMY    . CHOLECYSTECTOMY    . CORONARY ARTERY BYPASS GRAFT    . OTHER SURGICAL HISTORY     ?heart valve replacement  . PERIPHERAL VASCULAR CATHETERIZATION N/A 09/26/2014   Procedure: Abdominal Aortogram;  Surgeon: Nada Libman, MD;  Location: [facility] INVASIVE CV LAB;  Service: Cardiovascular;  Laterality: N/A;  . TONSILLECTOMY       OB History   None      Home Medications    Prior to Admission medications   Medication Sig Start Date End Date Taking? Authorizing Provider  aspirin 81 MG tablet Take 81 mg by mouth daily.    [provider]  atorvastatin (LIPITOR) 80 MG tablet Take 80 mg by mouth daily.      [provider]  clopidogrel (PLAVIX) 75 MG tablet Take 75 mg by mouth daily.      [provider]  doxycycline (VIBRAMYCIN) 100 MG capsule Take 1 capsule (100 mg total) by mouth 2 (two) times daily. Patient not taking: Reported on 11/06/2016 09/30/15   Barrett Henle, PA-C  famotidine (PEPCID) 20 MG tablet Take 20 mg by mouth 2 (two) times daily.      [provider]  fluticasone (FLONASE) 50 MCG/ACT nasal spray Place 1 spray into both nostrils daily.  07/22/13   [provider]  fluticasone-salmeterol (ADVAIR HFA) 115-21 MCG/ACT inhaler Inhale 2 puffs into the lungs 2 (two) times daily.      [provider]  furosemide (LASIX) 40 MG tablet Take 40  mg by mouth 2 (two) times daily. 07/11/12   Lollie Sails, MD  gabapentin (NEURONTIN) 100 MG capsule Take 100 mg by mouth 3 (three) times daily. 07/22/13   [provider]  isosorbide dinitrate (ISORDIL) 30 MG tablet Take 30 mg by mouth daily.     [provider]  lisinopril (PRINIVIL,ZESTRIL) 10 MG tablet Take 1 tablet (10 mg total) by mouth daily. 07/11/12   Lollie Sails, MD  losartan (COZAAR) 50 MG tablet Take 50 mg by mouth daily.    [provider]  metoprolol (TOPROL-XL) 100 MG 24 hr tablet Take 100 mg by mouth daily.      [provider]  nebivolol (BYSTOLIC) 10 MG tablet Take 10 mg by mouth daily.    [provider]    potassium chloride SA (K-DUR,KLOR-CON) 20 MEQ tablet Take 2 tablets (40 mEq total) by mouth daily. 07/11/12   Lollie Sails, MD  PROVENTIL HFA 108 (90 BASE) MCG/ACT inhaler Inhale 2 puffs into the lungs every 4 (four) hours as needed for wheezing or shortness of breath.  05/29/14   [provider]  tiotropium (SPIRIVA) 18 MCG inhalation capsule Place 18 mcg into inhaler and inhale daily.      [provider]    Family History Family History  Problem Relation Age of Onset  . Diabetes Mother   . Varicose Veins Mother   . Cancer Father   . Diabetes Daughter   . Heart disease Daughter   . Hypertension Daughter   . Heart attack Daughter        before age 65  . Heart disease Son   . Hypertension Son   . Heart attack Son        before age 31  . Cancer Sister     Social History Social History   Tobacco Use  . Smoking status: Current Every Day Smoker    Packs/day: 0.50    Years: 50.00    Pack years: 25.00    Types: Cigarettes  . Smokeless tobacco: Never Used  Substance Use Topics  . Alcohol use: No    Alcohol/week: 0.0 oz  . Drug use: No     Allergies   Avelox [moxifloxacin hcl in nacl] and Penicillins   Review of Systems Review of Systems  All other systems reviewed and are negative.    Physical Exam Updated Vital Signs BP 104/84   Pulse 81   Resp 17   Ht 5\' 1"  (1.549 m)   Wt 66.2 kg (146 lb)   SpO2 92%   BMI 27.59 kg/m   Physical Exam  Constitutional: She is oriented to person, place, and time. She appears well-developed and well-nourished.  HENT:  Head: Normocephalic and atraumatic.  Eyes: Pupils are equal, round, and reactive to light. Conjunctivae and EOM are normal.  Neck: Normal range of motion. Neck supple.  Cardiovascular: Normal rate and regular rhythm. Exam reveals no gallop and no friction rub.  No murmur heard. Pulmonary/Chest: Effort normal and breath sounds normal. No respiratory distress. She has no wheezes. She has no  rales. She exhibits no tenderness.  Diminished lung sounds bilaterally  Abdominal: Soft. Bowel sounds are normal. She exhibits no distension and no mass. There is no tenderness. There is no rebound and no guarding.  Musculoskeletal: Normal range of motion. She exhibits no tenderness.       Right lower leg: She exhibits edema.       Left lower leg: She exhibits edema.  Bilateral lower extremity pitting edema 1+  Neurological: She is alert and oriented to person, place, and time.  Skin: Skin is warm and dry.  Psychiatric: She has a normal mood and affect. Her behavior is normal. Judgment and thought content normal.  Nursing note and vitals reviewed.    ED Treatments / Results  Labs (all labs ordered are listed, but only abnormal results are displayed) Labs Reviewed  BASIC METABOLIC PANEL - Abnormal; Notable for the following components:      Result Value   Potassium 3.0 (*)    Chloride 97 (*)    Glucose, Bld 156 (*)    GFR calc non Af Amer 54 (*)    All other components within normal limits  CBC - Abnormal; Notable for the following components:   MCV 103.8 (*)    All other components within normal limits  BRAIN NATRIURETIC PEPTIDE - Abnormal; Notable for the following components:   B Natriuretic Peptide 225.7 (*)    All other components within normal limits  HEPATIC FUNCTION PANEL - Abnormal; Notable for the following components:   ALT 13 (*)    Total Bilirubin 1.3 (*)    Indirect Bilirubin 1.0 (*)    All other components within normal limits  PROTIME-INR  URINALYSIS, ROUTINE W REFLEX MICROSCOPIC  TROPONIN I  TROPONIN I  TROPONIN I  I-STAT TROPONIN, ED    EKG EKG Interpretation  Date/Time:  Saturday May 23 2017 22:36:59 EDT Ventricular Rate:  87 PR Interval:    QRS Duration: 85 QT Interval:  401 QTC Calculation: 483 R Axis:   -27 Text Interpretation:  Sinus rhythm Short PR interval Probable left atrial enlargement Inferior infarct, old Anterior infarct, old No  significant change since last tracing Confirmed by Melene Plan [phone]) on 05/23/2017 11:38:00 PM   Radiology Dg Chest 2 View  Result Date: 05/23/2017 CLINICAL DATA:  Shortness of breath. EXAM: CHEST - 2 VIEW COMPARISON:  05/07/2017 FINDINGS: Left-sided pacemaker in place. Post median sternotomy. Unchanged heart size and mediastinal contours. Decreasing linear right midlung opacity with mild residual, likely fluid in the fissure. There are small bilateral pleural effusions are new. Increased peribronchial thickening from prior exam. No confluent airspace disease. No pneumothorax. Bones are under mineralized. IMPRESSION: Small pleural effusions, new from prior. Increased peribronchial thickening from prior exam may be bronchitic change or pulmonary edema. Slight decrease fluid in the minor fissure. Suspect a degree of fluid overload. Electronically Signed   By: Rubye Oaks M.D.   On: 05/23/2017 23:28    Procedures Procedures (including critical care time)  Medications Ordered in ED Medications  ipratropium-albuterol (DUONEB) 0.5-2.5 (3) MG/3ML nebulizer solution 3 mL (has no administration in time range)     Initial Impression / Assessment and Plan / ED Course  I have reviewed the triage vital signs and the nursing notes.  Pertinent labs & imaging results that were available during my care of the patient were reviewed by me and considered in my medical decision making (see chart for details).     Patient with SOB and wheezing.  Significantly improved after breathing treatment PTA.  Labs pending.  Wears home O2 - 3L.  O2 sats in the uppers 80s at rest on 3 L.  Will give additional breathing treatment and reassess.  Still dropping O2 with any exertion.  Drops to mid 80s despite being on home O2.  Seen by and discussed with Dr. Adela Lank, who recommends admission.  Discussed with Dr. Adela Glimpse, who will come to  see the patient.  Final Clinical Impressions(s) / ED Diagnoses   Final diagnoses:   Acute on chronic congestive heart failure, unspecified heart failure type [facility](HCC)    ED Discharge Orders    None       Roxy HorsemanBrowning, Jaclin Finks, PA-C 05/24/17 0112    Melene PlanFloyd, Dan, DO 05/24/17 [id number]

## 2017-05-24 ENCOUNTER — Encounter (HOSPITAL_COMMUNITY): Payer: Self-pay | Admitting: Student

## 2017-05-24 ENCOUNTER — Inpatient Hospital Stay (HOSPITAL_COMMUNITY): Payer: Medicare Other

## 2017-05-24 DIAGNOSIS — R0902 Hypoxemia: Secondary | ICD-10-CM | POA: Diagnosis not present

## 2017-05-24 DIAGNOSIS — E785 Hyperlipidemia, unspecified: Secondary | ICD-10-CM | POA: Diagnosis not present

## 2017-05-24 DIAGNOSIS — I2510 Atherosclerotic heart disease of native coronary artery without angina pectoris: Secondary | ICD-10-CM | POA: Diagnosis present

## 2017-05-24 DIAGNOSIS — I272 Pulmonary hypertension, unspecified: Secondary | ICD-10-CM | POA: Diagnosis not present

## 2017-05-24 DIAGNOSIS — J9621 Acute and chronic respiratory failure with hypoxia: Secondary | ICD-10-CM | POA: Diagnosis present

## 2017-05-24 DIAGNOSIS — J441 Chronic obstructive pulmonary disease with (acute) exacerbation: Secondary | ICD-10-CM | POA: Diagnosis present

## 2017-05-24 DIAGNOSIS — E7800 Pure hypercholesterolemia, unspecified: Secondary | ICD-10-CM | POA: Diagnosis present

## 2017-05-24 DIAGNOSIS — E876 Hypokalemia: Secondary | ICD-10-CM | POA: Diagnosis not present

## 2017-05-24 DIAGNOSIS — Z79899 Other long term (current) drug therapy: Secondary | ICD-10-CM | POA: Diagnosis not present

## 2017-05-24 DIAGNOSIS — I10 Essential (primary) hypertension: Secondary | ICD-10-CM

## 2017-05-24 DIAGNOSIS — M7989 Other specified soft tissue disorders: Secondary | ICD-10-CM | POA: Diagnosis not present

## 2017-05-24 DIAGNOSIS — K589 Irritable bowel syndrome without diarrhea: Secondary | ICD-10-CM | POA: Diagnosis present

## 2017-05-24 DIAGNOSIS — D649 Anemia, unspecified: Secondary | ICD-10-CM | POA: Diagnosis present

## 2017-05-24 DIAGNOSIS — I739 Peripheral vascular disease, unspecified: Secondary | ICD-10-CM | POA: Diagnosis not present

## 2017-05-24 DIAGNOSIS — J9690 Respiratory failure, unspecified, unspecified whether with hypoxia or hypercapnia: Secondary | ICD-10-CM | POA: Diagnosis present

## 2017-05-24 DIAGNOSIS — R600 Localized edema: Secondary | ICD-10-CM

## 2017-05-24 DIAGNOSIS — E1151 Type 2 diabetes mellitus with diabetic peripheral angiopathy without gangrene: Secondary | ICD-10-CM | POA: Diagnosis present

## 2017-05-24 DIAGNOSIS — I5033 Acute on chronic diastolic (congestive) heart failure: Secondary | ICD-10-CM | POA: Diagnosis not present

## 2017-05-24 DIAGNOSIS — J42 Unspecified chronic bronchitis: Secondary | ICD-10-CM | POA: Diagnosis not present

## 2017-05-24 DIAGNOSIS — Z7901 Long term (current) use of anticoagulants: Secondary | ICD-10-CM | POA: Diagnosis not present

## 2017-05-24 DIAGNOSIS — Z7951 Long term (current) use of inhaled steroids: Secondary | ICD-10-CM | POA: Diagnosis not present

## 2017-05-24 DIAGNOSIS — Z951 Presence of aortocoronary bypass graft: Secondary | ICD-10-CM | POA: Diagnosis not present

## 2017-05-24 DIAGNOSIS — I959 Hypotension, unspecified: Secondary | ICD-10-CM | POA: Diagnosis present

## 2017-05-24 DIAGNOSIS — I252 Old myocardial infarction: Secondary | ICD-10-CM | POA: Diagnosis not present

## 2017-05-24 DIAGNOSIS — I5043 Acute on chronic combined systolic (congestive) and diastolic (congestive) heart failure: Secondary | ICD-10-CM | POA: Diagnosis present

## 2017-05-24 DIAGNOSIS — I110 Hypertensive heart disease with heart failure: Secondary | ICD-10-CM | POA: Diagnosis present

## 2017-05-24 DIAGNOSIS — Z950 Presence of cardiac pacemaker: Secondary | ICD-10-CM | POA: Diagnosis not present

## 2017-05-24 DIAGNOSIS — J9622 Acute and chronic respiratory failure with hypercapnia: Secondary | ICD-10-CM | POA: Diagnosis not present

## 2017-05-24 DIAGNOSIS — F17210 Nicotine dependence, cigarettes, uncomplicated: Secondary | ICD-10-CM | POA: Diagnosis present

## 2017-05-24 DIAGNOSIS — J9601 Acute respiratory failure with hypoxia: Secondary | ICD-10-CM | POA: Diagnosis not present

## 2017-05-24 DIAGNOSIS — Z8249 Family history of ischemic heart disease and other diseases of the circulatory system: Secondary | ICD-10-CM | POA: Diagnosis not present

## 2017-05-24 DIAGNOSIS — Z888 Allergy status to other drugs, medicaments and biological substances status: Secondary | ICD-10-CM | POA: Diagnosis not present

## 2017-05-24 DIAGNOSIS — I509 Heart failure, unspecified: Secondary | ICD-10-CM | POA: Diagnosis not present

## 2017-05-24 DIAGNOSIS — Z7982 Long term (current) use of aspirin: Secondary | ICD-10-CM | POA: Diagnosis not present

## 2017-05-24 DIAGNOSIS — Z66 Do not resuscitate: Secondary | ICD-10-CM | POA: Diagnosis present

## 2017-05-24 DIAGNOSIS — J440 Chronic obstructive pulmonary disease with acute lower respiratory infection: Secondary | ICD-10-CM | POA: Diagnosis present

## 2017-05-24 DIAGNOSIS — J90 Pleural effusion, not elsewhere classified: Secondary | ICD-10-CM | POA: Diagnosis not present

## 2017-05-24 DIAGNOSIS — R0989 Other specified symptoms and signs involving the circulatory and respiratory systems: Secondary | ICD-10-CM | POA: Diagnosis not present

## 2017-05-24 DIAGNOSIS — I340 Nonrheumatic mitral (valve) insufficiency: Secondary | ICD-10-CM | POA: Diagnosis not present

## 2017-05-24 LAB — HEPATIC FUNCTION PANEL
ALBUMIN: 3.5 g/dL (ref 3.5–5.0)
ALT: 13 U/L — AB (ref 14–54)
AST: 18 U/L (ref 15–41)
Alkaline Phosphatase: 51 U/L (ref 38–126)
BILIRUBIN DIRECT: 0.3 mg/dL (ref 0.1–0.5)
BILIRUBIN TOTAL: 1.3 mg/dL — AB (ref 0.3–1.2)
Indirect Bilirubin: 1 mg/dL — ABNORMAL HIGH (ref 0.3–0.9)
Total Protein: 7.1 g/dL (ref 6.5–8.1)

## 2017-05-24 LAB — TROPONIN I
Troponin I: <0.03 ng/mL (ref <0.03)
Troponin I: <0.03 ng/mL (ref <0.03)

## 2017-05-24 LAB — CBC
HCT: 26.2 % — ABNORMAL LOW (ref 36.0–46.0)
HEMATOCRIT: 32.9 % — AB (ref 36.0–46.0)
HEMOGLOBIN: 8.4 g/dL — AB (ref 12.0–15.0)
Hemoglobin: 10.3 g/dL — ABNORMAL LOW (ref 12.0–15.0)
MCH: 32.9 pg (ref 26.0–34.0)
MCH: 32.5 pg (ref 26.0–34.0)
MCHC: 32.1 g/dL (ref 30.0–36.0)
MCHC: 31.3 g/dL (ref 30.0–36.0)
MCV: 102.7 fL — AB (ref 78.0–100.0)
MCV: 103.8 fL — AB (ref 78.0–100.0)
PLATELETS: 174 10*3/uL (ref 150–400)
PLATELETS: 146 10*3/uL — AB (ref 150–400)
RBC: 2.55 MIL/uL — AB (ref 3.87–5.11)
RBC: 3.17 MIL/uL — ABNORMAL LOW (ref 3.87–5.11)
RDW: 13.4 % (ref 11.5–15.5)
RDW: 13.4 % (ref 11.5–15.5)
WBC: 9.8 10*3/uL (ref 4.0–10.5)
WBC: 8 10*3/uL (ref 4.0–10.5)

## 2017-05-24 LAB — URINALYSIS, ROUTINE W REFLEX MICROSCOPIC
Bilirubin Urine: NEGATIVE
Glucose, UA: NEGATIVE mg/dL
Hgb urine dipstick: NEGATIVE
KETONES UR: NEGATIVE mg/dL
LEUKOCYTES UA: NEGATIVE
Nitrite: NEGATIVE
PH: 6 (ref 5.0–8.0)
Protein, ur: NEGATIVE mg/dL
Specific Gravity, Urine: 1.024 (ref 1.005–1.030)

## 2017-05-24 LAB — COMPREHENSIVE METABOLIC PANEL
ALBUMIN: 2.8 g/dL — AB (ref 3.5–5.0)
ALK PHOS: 40 U/L (ref 38–126)
ALT: 11 U/L — AB (ref 14–54)
AST: 14 U/L — ABNORMAL LOW (ref 15–41)
Anion gap: 11 (ref 5–15)
BUN: 13 mg/dL (ref 6–20)
CALCIUM: 8.5 mg/dL — AB (ref 8.9–10.3)
CHLORIDE: 100 mmol/L — AB (ref 101–111)
CO2: 31 mmol/L (ref 22–32)
CREATININE: 0.89 mg/dL (ref 0.44–1.00)
GFR calc non Af Amer: 60 mL/min — ABNORMAL LOW (ref >60)
GLUCOSE: 111 mg/dL — AB (ref 65–99)
Potassium: 3.6 mmol/L (ref 3.5–5.1)
SODIUM: 142 mmol/L (ref 135–145)
Total Bilirubin: 0.7 mg/dL (ref 0.3–1.2)
Total Protein: 5.4 g/dL — ABNORMAL LOW (ref 6.5–8.1)

## 2017-05-24 LAB — PHOSPHORUS: Phosphorus: 3.5 mg/dL (ref 2.5–4.6)

## 2017-05-24 LAB — MAGNESIUM: Magnesium: 2.4 mg/dL (ref 1.7–2.4)

## 2017-05-24 LAB — TSH: TSH: 2.389 u[IU]/mL (ref 0.350–4.500)

## 2017-05-24 MED ORDER — MOMETASONE FURO-FORMOTEROL FUM 200-5 MCG/ACT IN AERO
2.0000 | INHALATION_SPRAY | Freq: Two times a day (BID) | RESPIRATORY_TRACT | Status: DC
  Administered 2017-05-24 – 2017-05-27 (×6): 2 via RESPIRATORY_TRACT
  Filled 2017-05-24: qty 8.8

## 2017-05-24 MED ORDER — ACETAMINOPHEN 325 MG PO TABS
650.0000 mg | ORAL_TABLET | Freq: Four times a day (QID) | ORAL | Status: DC | PRN
  Filled 2017-05-24: qty 2

## 2017-05-24 MED ORDER — METOPROLOL TARTRATE 25 MG PO TABS
25.0000 mg | ORAL_TABLET | Freq: Two times a day (BID) | ORAL | Status: DC
Start: 2017-05-24 — End: 2017-05-24

## 2017-05-24 MED ORDER — GUAIFENESIN ER 600 MG PO TB12
600.0000 mg | ORAL_TABLET | Freq: Two times a day (BID) | ORAL | Status: DC
  Administered 2017-05-24 – 2017-05-27 (×7): 600 mg via ORAL
  Filled 2017-05-24 (×7): qty 1

## 2017-05-24 MED ORDER — FUROSEMIDE 40 MG PO TABS
40.0000 mg | ORAL_TABLET | Freq: Two times a day (BID) | ORAL | Status: DC
  Administered 2017-05-24 (×2): 40 mg via ORAL
  Filled 2017-05-24: qty 2
  Filled 2017-05-24: qty 1

## 2017-05-24 MED ORDER — CLOPIDOGREL BISULFATE 75 MG PO TABS
75.0000 mg | ORAL_TABLET | Freq: Every day | ORAL | Status: DC
  Administered 2017-05-24 – 2017-05-27 (×4): 75 mg via ORAL
  Filled 2017-05-24 (×4): qty 1

## 2017-05-24 MED ORDER — POTASSIUM CHLORIDE 10 MEQ/100ML IV SOLN
10.0000 meq | INTRAVENOUS | Status: AC
  Administered 2017-05-24 (×2): 10 meq via INTRAVENOUS
  Filled 2017-05-24: qty 100

## 2017-05-24 MED ORDER — ATORVASTATIN CALCIUM 80 MG PO TABS
80.0000 mg | ORAL_TABLET | Freq: Every day | ORAL | Status: DC
  Administered 2017-05-24 – 2017-05-27 (×4): 80 mg via ORAL
  Filled 2017-05-24 (×5): qty 1

## 2017-05-24 MED ORDER — ENOXAPARIN SODIUM 40 MG/0.4ML ~~LOC~~ SOLN
40.0000 mg | Freq: Every day | SUBCUTANEOUS | Status: DC
  Filled 2017-05-24: qty 0.4

## 2017-05-24 MED ORDER — POTASSIUM CHLORIDE CRYS ER 20 MEQ PO TBCR: 40.0000 meq | EXTENDED_RELEASE_TABLET | Freq: Once | ORAL | Status: DC

## 2017-05-24 MED ORDER — IOPAMIDOL (ISOVUE-370) INJECTION 76%
100.0000 mL | Freq: Once | INTRAVENOUS | Status: AC | PRN
  Administered 2017-05-24: 100 mL via INTRAVENOUS

## 2017-05-24 MED ORDER — IPRATROPIUM-ALBUTEROL 0.5-2.5 (3) MG/3ML IN SOLN
3.0000 mL | Freq: Four times a day (QID) | RESPIRATORY_TRACT | Status: DC
  Administered 2017-05-24 – 2017-05-25 (×5): 3 mL via RESPIRATORY_TRACT
  Filled 2017-05-24 (×7): qty 3

## 2017-05-24 MED ORDER — ONDANSETRON HCL 4 MG/2ML IJ SOLN: 4.0000 mg | Freq: Four times a day (QID) | INTRAMUSCULAR | Status: DC | PRN

## 2017-05-24 MED ORDER — GABAPENTIN 300 MG PO CAPS
300.0000 mg | ORAL_CAPSULE | Freq: Three times a day (TID) | ORAL | Status: DC
  Administered 2017-05-24 – 2017-05-27 (×10): 300 mg via ORAL
  Filled 2017-05-24 (×10): qty 1

## 2017-05-24 MED ORDER — ISOSORBIDE DINITRATE 10 MG PO TABS
10.0000 mg | ORAL_TABLET | Freq: Two times a day (BID) | ORAL | Status: DC
  Administered 2017-05-24 – 2017-05-27 (×5): 10 mg via ORAL
  Filled 2017-05-24 (×6): qty 1

## 2017-05-24 MED ORDER — HYDROCODONE-ACETAMINOPHEN 5-325 MG PO TABS: 1.0000 | ORAL_TABLET | ORAL | Status: DC | PRN

## 2017-05-24 MED ORDER — SODIUM CHLORIDE 0.9% FLUSH
3.0000 mL | Freq: Two times a day (BID) | INTRAVENOUS | Status: DC
  Administered 2017-05-24: 3 mL via INTRAVENOUS

## 2017-05-24 MED ORDER — FAMOTIDINE 20 MG PO TABS
20.0000 mg | ORAL_TABLET | Freq: Two times a day (BID) | ORAL | Status: DC
  Administered 2017-05-24 – 2017-05-27 (×7): 20 mg via ORAL
  Filled 2017-05-24 (×7): qty 1

## 2017-05-24 MED ORDER — ALBUTEROL SULFATE (2.5 MG/3ML) 0.083% IN NEBU: 2.5000 mg | INHALATION_SOLUTION | RESPIRATORY_TRACT | Status: DC | PRN

## 2017-05-24 MED ORDER — ONDANSETRON HCL 4 MG PO TABS: 4.0000 mg | ORAL_TABLET | Freq: Four times a day (QID) | ORAL | Status: DC | PRN

## 2017-05-24 MED ORDER — IOPAMIDOL (ISOVUE-370) INJECTION 76%
INTRAVENOUS | Status: AC
  Filled 2017-05-24: qty 100

## 2017-05-24 MED ORDER — SODIUM CHLORIDE 0.9% FLUSH: 3.0000 mL | INTRAVENOUS | Status: DC | PRN

## 2017-05-24 MED ORDER — SODIUM CHLORIDE 0.9 % IV SOLN: 250.0000 mL | INTRAVENOUS | Status: DC | PRN

## 2017-05-24 MED ORDER — METOPROLOL TARTRATE 25 MG PO TABS
12.5000 mg | ORAL_TABLET | Freq: Two times a day (BID) | ORAL | Status: DC
  Administered 2017-05-24 – 2017-05-27 (×6): 12.5 mg via ORAL
  Filled 2017-05-24 (×7): qty 1

## 2017-05-24 MED ORDER — ASPIRIN EC 81 MG PO TBEC
81.0000 mg | DELAYED_RELEASE_TABLET | Freq: Every day | ORAL | Status: DC
  Administered 2017-05-24 – 2017-05-27 (×4): 81 mg via ORAL
  Filled 2017-05-24 (×4): qty 1

## 2017-05-24 MED ORDER — ACETAMINOPHEN 650 MG RE SUPP: 650.0000 mg | Freq: Four times a day (QID) | RECTAL | Status: DC | PRN

## 2017-05-24 NOTE — ED Notes (Signed)
MD paged to see if pt can be re-assessed to downgrade to tele.

## 2017-05-24 NOTE — ED Notes (Addendum)
Patient sleeping with no distress, respirations unlabored , O2 sat= 100% NRB mask 15 lpm , IV site intact , Purewick external catheter intact .

## 2017-05-24 NOTE — ED Notes (Signed)
Family at bedside. 

## 2017-05-24 NOTE — Progress Notes (Signed)
MD -- Asking if Patient can have an outpatient referral/appointment for Podiatry.  Re: long thick, potentially fugal nails on BLE with dry cracked flaking rough skin.

## 2017-05-24 NOTE — Consult Note (Addendum)
Cardiology Consult    Patient ID: Belinda Werner; [id number]; Feb 28, 1934   Admit date: 05/23/2017 Date of Consult: 05/24/2017  Primary Care Provider: Angelica Chessman, MD Primary Cardiologist: [facility]  [place] - Dr. Bary Castilla  Patient Profile    Belinda Werner is a 83 y.o. female with past medical history of CAD (s/p CABG in 2004 with patent grafts by cath in 07/2015), SSS (s/p Medtronic dual-chamber PPM placement in 2017), PVD, COPD (on 3L [place] at baseline), HTN, and HLD who is being seen today for the evaluation of CHF at the request of Dr. Adela Glimpse.   History of Present Illness    Ms. Halberg is followed by Dr. Bary Castilla in [facility] and by review of Care Everywhere was overall doing well at the time of her last office visit in 12/2016. Device was interrogated in 03/2017 and showed < 1% of RV Pacing with 3 episodes of NSVT.   She presented to [facility] ED on 05/23/2017 for evaluation of worsening dyspnea, wheezing, and productive cough. Reported being in her usual state of health until an hour prior to her arrival. She has baseline dyspnea on exertion but says breathing had been stable over the past several days. Denies any recent orthopnea or PND. No associated chest pain or palpitations. Has noticed worsening lower extremity edema.   DuoNeb was administered by EMS and she experienced improvement in her symptoms. Initially required NRB but has been transitioned to nasal cannula.   Initial labs show WBC 8.0, Hgb 13.4, platelets 174, Na+ 142, K+ 3.0, and creatinine 0.96. BNP 225. Initial and repeat troponin values have been flat at 0.03. EKG shows A-paced rhythm, HR 72. CXR shows small pleural effusions with increased peribronchial thickening. CTA showing no evidence of a PE but noted to have pulmonary edema and aortic atherosclerosis.   She was given IV Lasix 40mg  upon arrival along with PO Lasix 40mg  this morning and just urinated for the first time since receiving the medications. Says she  overall feels much improved as compared to arrival.    Past Medical History:  Diagnosis Date  . CAD (coronary artery disease)    a. s/p CABG in 2004 with patent grafts by cath in 07/2015  . COPD (chronic obstructive pulmonary disease) (HCC)   . Diastolic dysfunction    echo 09/2008 EF 55%, mild to mod LVH, LA mild to mod dilated  . Hypercholesterolemia   . Hypertension   . MI (myocardial infarction) (HCC)    history  . Pneumonia   . PVD (peripheral vascular disease) (HCC)    refused amputation in the past     Past Surgical History:  Procedure Laterality Date  . ABDOMINAL HYSTERECTOMY    . APPENDECTOMY    . CHOLECYSTECTOMY    . CORONARY ARTERY BYPASS GRAFT    . OTHER SURGICAL HISTORY     ?heart valve replacement  . PERIPHERAL VASCULAR CATHETERIZATION N/A 09/26/2014   Procedure: Abdominal Aortogram;  Surgeon: Nada Libman, MD;  Location: [facility] INVASIVE CV LAB;  Service: Cardiovascular;  Laterality: N/A;  . TONSILLECTOMY       Home Medications:  Prior to Admission medications   Medication Sig Start Date End Date Taking? Authorizing Provider  amLODipine (NORVASC) 5 MG tablet Take 5 mg by mouth daily.   Yes [provider]  aspirin 81 MG tablet Take 81 mg by mouth daily.   Yes [provider]  atorvastatin (LIPITOR) 80 MG tablet Take 80 mg by mouth daily.  Yes [provider]  budesonide-formoterol (SYMBICORT) 160-4.5 MCG/ACT inhaler Inhale 2 puffs into the lungs 2 (two) times daily.   Yes [provider]  clopidogrel (PLAVIX) 75 MG tablet Take 75 mg by mouth daily.     Yes [provider]  furosemide (LASIX) 40 MG tablet Take 40 mg by mouth 2 (two) times daily. 07/11/12  Yes Lollie Sails, MD  gabapentin (NEURONTIN) 100 MG capsule Take 300 mg by mouth 3 (three) times daily.  07/22/13  Yes [provider]  isosorbide dinitrate (ISORDIL) 30 MG tablet Take 30 mg by mouth 2 (two) times daily.    Yes [provider]    losartan (COZAAR) 50 MG tablet Take 50 mg by mouth daily.   Yes [provider]  metoprolol tartrate (LOPRESSOR) 25 MG tablet Take 25 mg by mouth 2 (two) times daily.   Yes [provider]  PROVENTIL HFA 108 (90 BASE) MCG/ACT inhaler Inhale 2 puffs into the lungs every 4 (four) hours as needed for wheezing or shortness of breath.  05/29/14  Yes [provider]    Inpatient Medications: Scheduled Meds: . aspirin EC  81 mg Oral Daily  . atorvastatin  80 mg Oral Daily  . clopidogrel  75 mg Oral Daily  . enoxaparin (LOVENOX) injection  40 mg Subcutaneous Daily  . famotidine  20 mg Oral BID  . furosemide  40 mg Oral BID  . gabapentin  300 mg Oral TID  . guaiFENesin  600 mg Oral BID  . ipratropium-albuterol  3 mL Nebulization Q6H  . mometasone-formoterol  2 puff Inhalation BID  . sodium chloride flush  3 mL Intravenous Q12H   Continuous Infusions: . sodium chloride     PRN Meds: sodium chloride, acetaminophen **OR** acetaminophen, albuterol, HYDROcodone-acetaminophen, ondansetron **OR** ondansetron (ZOFRAN) IV, sodium chloride flush  Allergies:    Allergies  Allergen Reactions  . Avelox [Moxifloxacin Hcl In Nacl] Hives and Other (See Comments)    hives  . Penicillins Hives    Social History:   Social History   Socioeconomic History  . Marital status: Divorced    Spouse name: Not on file  . Number of children: 2  . Years of education: Not on file  . Highest education level: Not on file  Occupational History  . Not on file  Social Needs  . Financial resource strain: Not on file  . Food insecurity:    Worry: Not on file    Inability: Not on file  . Transportation needs:    Medical: Not on file    Non-medical: Not on file  Tobacco Use  . Smoking status: Current Every Day Smoker    Packs/day: 0.50    Years: 50.00    Pack years: 25.00    Types: Cigarettes  . Smokeless tobacco: Never Used  Substance and Sexual Activity  . Alcohol use: No     Alcohol/week: 0.0 oz  . Drug use: No  . Sexual activity: Never  Lifestyle  . Physical activity:    Days per week: Not on file    Minutes per session: Not on file  . Stress: Not on file  Relationships  . Social connections:    Talks on phone: Not on file    Gets together: Not on file    Attends religious service: Not on file    Active member of club or organization: Not on file    Attends meetings of clubs or organizations: Not on file  Relationship status: Not on file  . Intimate partner violence:    Fear of current or ex partner: Not on file    Emotionally abused: Not on file    Physically abused: Not on file    Forced sexual activity: Not on file  Other Topics Concern  . Not on file  Social History Narrative   Smoking since age 55 y.o     Family History:    Family History  Problem Relation Age of Onset  . Diabetes Mother   . Varicose Veins Mother   . Cancer Father   . Diabetes Daughter   . Heart disease Daughter   . Hypertension Daughter   . Heart attack Daughter        before age 53  . Heart disease Son   . Hypertension Son   . Heart attack Son        before age 12  . Cancer Sister       Review of Systems    General:  No chills, fever, night sweats or weight changes.  Cardiovascular:  No chest pain, orthopnea, palpitations, paroxysmal nocturnal dyspnea. Positive for dyspnea on exertion and edema.  Dermatological: No rash, lesions/masses Respiratory: Positive for productive cough and dyspnea Urologic: No hematuria, dysuria Abdominal:   No nausea, vomiting, diarrhea, bright red blood per rectum, melena, or hematemesis Neurologic:  No visual changes, wkns, changes in mental status. All other systems reviewed and are otherwise negative except as noted above.  Physical Exam/Data    Vitals:   05/24/17 0400 05/24/17 0522 05/24/17 0608 05/24/17 0632  BP: (!) 121/38 (!) 144/53 (!) 127/36 (!) 138/33  Pulse: 70 99 70 74  Resp: 18 20 20  (!) 22  SpO2: 91% 99%  100% 100%  Weight:      Height:        Intake/Output Summary (Last 24 hours) at 05/24/2017 1009 Last data filed at 05/24/2017 0349 Gross per 24 hour  Intake 150 ml  Output -  Net 150 ml   Filed Weights   05/23/17 2233  Weight: 146 lb (66.2 kg)   Body mass index is 27.59 kg/m.   General: Pleasant elderly Caucasian female appearing in NAD Psych: Normal affect. Neuro: Alert and oriented X 3. Moves all extremities spontaneously. HEENT: Normal  Neck: Supple without bruits or JVD. Lungs:  Resp regular and unlabored, decreased breath sounds along bases bilaterally. Heart: RRR no s3, s4, or murmurs. Abdomen: Soft, non-tender, non-distended, BS + x 4.  Extremities: No clubbing or cyanosis. 1+ pitting edema bilaterally. DP/PT/Radials 2+ and equal bilaterally.   EKG:  The EKG was personally reviewed and demonstrates: A-paced rhythm, HR 72.   Labs/Studies     Relevant CV Studies:  Cardiac Catheterization: 07/2015 Conclusions Diagnostic Procedure Summary 1) Ostial Proximal LM 80% 2) LAD 100% occluded proximally 3) LCX: PLOM 100% occluded 4) RCA: Not injected known to be 100% occluded proximally 5) Patent SVG to the PDA which retrograde fills PLVB 6) Patent SVG to PLOM which retrograde fill the LCX system (thus making the ostial proximal Left main lesion less relevant 7) Patent LIMA to the LAD 8) Aortagram: revealed ostial left subclavian stenosis 70-80% with gradient of approximately 35 mmHg Diagnostic Procedure Recommendations Recommend evaluation for Intervention to the ostium of left subclavian branch as it may be inhibiting flow the LIMA--LAD. Consider function to study to evaluate ischemia in the LAD territory. I have reviewed the recent history and physical documentation. I personally spent 43 minutes continuously monitoring the  patient during the administration of moderate sedation. Pre and post activities have been reviewed. I was present for the entire  procedure.  Echocardiogram: 07/2015 Findings Mitral Valve Structurally normal mitral valve. Moderate mitral regurgitation by color flow doppler examination. E to A flow reversal noted. Aortic Valve The aortic valve appears to be trileaflet. There is mild aortic sclerosis noted, with no evidence of stenosis. Tricuspid Valve Tricuspid valve is structurally normal. Mild to moderate tricuspid regurgitation by color flow doppler examination. Pulmonic Valve The pulmonic valve was not well visualized No Doppler evidence of pulmonic stenosis Trace pulmonic regurgitation present by color flow doppler examination. Left Atrium Moderate Left Atrial enlargement Left Ventricle Mild concentric left ventricular hypertrophy Normal left ventricular size and systolic function with no appreciable segmental abnormality. Right Atrium Moderate right atrial enlargement Dilated IVC with poor inspiratory collapse consistent with elevated RA pressure. Right Ventricle Mildly dilated right ventricle. Pericardial Effusion No evidence of pericardial effusion. Miscellaneous The aorta is within normal limits. The IVC is dilated Dilated IVC with poor inspiration collapse consistent with elevated right atrial pressure.   Laboratory Data:  Chemistry Recent Labs  Lab 05/23/17 2252 05/24/17 0616  NA 142 142  K 3.0* 3.6  CL 97* 100*  CO2 32 31  GLUCOSE 156* 111*  BUN 14 13  CREATININE 0.96 0.89  CALCIUM 9.3 8.5*  GFRNONAA 54* 60*  GFRAA >60 >60  ANIONGAP 13 11    Recent Labs  Lab 05/23/17 2252 05/24/17 0616  PROT 7.1 5.4*  ALBUMIN 3.5 2.8*  AST 18 14*  ALT 13* 11*  ALKPHOS 51 40  BILITOT 1.3* 0.7   Hematology Recent Labs  Lab 05/23/17 2252 05/24/17 0616  WBC 8.0 9.8  RBC 3.94 2.55*  HGB 13.4 8.4*  HCT 40.9 26.2*  MCV 103.8* 102.7*  MCH 34.0 32.9  MCHC 32.8 32.1  RDW 13.6 13.4  PLT 174 174   Cardiac Enzymes Recent Labs  Lab 05/24/17 0122 05/24/17 0616  TROPONINI <0.03  <0.03    Recent Labs  Lab 05/23/17 2256  TROPIPOC 0.01    BNP Recent Labs  Lab 05/23/17 2252  BNP 225.7*    DDimer No results for input(s): DDIMER in the last 168 hours.  Radiology/Studies:  Dg Chest 2 View  Result Date: 05/23/2017 CLINICAL DATA:  Shortness of breath. EXAM: CHEST - 2 VIEW COMPARISON:  05/07/2017 FINDINGS: Left-sided pacemaker in place. Post median sternotomy. Unchanged heart size and mediastinal contours. Decreasing linear right midlung opacity with mild residual, likely fluid in the fissure. There are small bilateral pleural effusions are new. Increased peribronchial thickening from prior exam. No confluent airspace disease. No pneumothorax. Bones are under mineralized. IMPRESSION: Small pleural effusions, new from prior. Increased peribronchial thickening from prior exam may be bronchitic change or pulmonary edema. Slight decrease fluid in the minor fissure. Suspect a degree of fluid overload. Electronically Signed   By: Rubye Oaks M.D.   On: 05/23/2017 23:28   Ct Angio Chest Pe W Or Wo Contrast  Result Date: 05/24/2017 CLINICAL DATA:  PE suspected, high pretest prob. Cough and shortness of breath. EXAM: CT ANGIOGRAPHY CHEST WITH CONTRAST TECHNIQUE: Multidetector CT imaging of the chest was performed using the standard protocol during bolus administration of intravenous contrast. Multiplanar CT image reconstructions and MIPs were obtained to evaluate the vascular anatomy. CONTRAST:  59 cc ISOVUE-370 IOPAMIDOL (ISOVUE-370) INJECTION 76% COMPARISON:  Radiographs yesterday.  Chest CT 09/30/2015 FINDINGS: Cardiovascular: There are no filling defects within the pulmonary arteries to suggest pulmonary  embolus. Multi chamber cardiomegaly. Aortic atherosclerosis without aneurysm or dissection. Stenosis at the origin of the left subclavian artery, as seen on prior exam. There are native coronary artery calcifications, post CABG. Pacemaker with leads in the right atrium and  ventricle. Mediastinum/Nodes: Prominent right hilar node measures 12 mm short axis. Lower paratracheal node slightly decreased from prior measuring 18 mm, previously 21. Additional small mediastinal lymph nodes are similar to prior exam. Small hiatal hernia. No thyroid nodule. Lungs/Pleura: Mild emphysema. Mild septal thickening consistent with pulmonary edema. There is central bronchial thickening that may be congestive or bronchitic. Linear opacities in the right upper and middle lobes consistent with scarring. No significant pleural effusion. Dependent atelectasis in both lungs. Upper Abdomen: Stent in the upper abdominal aorta is partially included but grossly stable from 2017 CT. No acute findings. Musculoskeletal: Chronic T5 and T6 compression fractures. Post median sternotomy. No acute osseous abnormality. Review of the MIP images confirms the above findings. IMPRESSION: 1. No pulmonary embolus. 2. Smooth septal thickening consistent with pulmonary edema, bronchial thickening may be secondary to pulmonary edema or bronchitic change. 3. Cardiomegaly. Aortic atherosclerosis. Post CABG with calcification of the native coronary arteries. 4. Right middle and upper lobe scarring. Aortic Atherosclerosis (ICD10-I70.0) and Emphysema (ICD10-J43.9). Electronically Signed   By: Rubye OaksMelanie  Ehinger M.D.   On: 05/24/2017 03:11     Assessment & Plan    1. Acute Hypoxic Respiratory Failure - likely multifactorial in the setting of severe COPD and CHF exacerbation. Reported acute worsening of her respiratory status 1 hour prior to arrival. Had noticed worsening lower extremity edema but denies any orthopnea or PND. On 3L [place] at baseline.  - Initial labs show WBC 8.0, Hgb 13.4, platelets 174, Na+ 142, K+ 3.0, and creatinine 0.96. BNP 225. Initial and repeat troponin values have been flat at 0.03. EKG shows A-paced rhythm, with no acute ST or T-wave changes. CXR shows small pleural effusions with increased peribronchial  thickening. - given IV Lasix 40mg  upon arrival and already transitioned to PO Lasix 40mg . Recently started having urine output. Overall, she does not appear significantly volume overloaded by physical examination. Would continue with PO Lasix at current dosing and monitor I&O's along with daily weights. Recommend checking UA with her episodes of dysuria. Of note, repeat CBC this AM shows Hgb has declined to 8.4 (previously 13.4 on admission). She denies any evidence of active bleeding. Will recheck CBC as her anemia could also be a contributing factor to her presenting dyspnea.   2. CAD - s/p CABG in 2004 with catheterization in 07/2015 showing severe native CAD but patent LIMA-LAD, SVG-PDA, and SVG-PLOM.  - she has baseline dyspnea on exertion but denies any recent episodes of chest pain.  - troponin values have been flat at 0.03 and EKG shows no acute ischemic changes.  - continue PTA ASA, Plavix, and statin. BB currently held due to episodes of hypotension.   3. SSS - s/p Medtronic dual-chamber PPM placement in 2017. Recent interrogation by Review of Care Everywhere showed normal device function.  - [facility] [place].   4. HTN - BP variable at 94/29 (?) to 144/91 since admission.  - PTA Amlodipine, Isordil, Losartan, and Lopressor currently held due to episodes of hypotension. Continue to follow.  5. PVD - s/p stenting of LICA and right carotid endarterectomy in 2003 with stenting of left common and external iliac in 2016. Followed by Vascular Surgery as an outpatient.     For  questions or updates, please contact [facility] Please consult [email or web address] for contact info under Cardiology/STEMI.  Signed, Ellsworth Lennox, PA-C 05/24/2017, 10:09 AM Pager: [phone]  I have seen, examined the patient, and reviewed the above assessment and plan.  Changes to above are made where necessary.  The patient states "I want to go home.  I feel fine.  I dont know why I am  here".  On exam, RRR.  CTAB but with prolonged expiratory phase, dependant edema.  Agree with above.  Appears near euvolemic.  Would convert to oral lasix.  Primary team to manage COPD.  Cardiology to see again if CHF worsens.  Otherwise, we can see as an outpatient.   Co Sign: Hillis Range, MD 05/24/2017 11:38 AM

## 2017-05-24 NOTE — ED Notes (Signed)
Lunch tray ordered 

## 2017-05-24 NOTE — ED Notes (Signed)
Lunch Tray Ordered 

## 2017-05-24 NOTE — Progress Notes (Signed)
[facility] TEAM 1 - Stepdown/ICU TEAM  Dalene SeltzerCarol Fretwell  YNW:[id number] DOB: January 11, 1935 DOA: 05/23/2017 PCP: Angelica ChessmanAguiar, Rafaela M, MD    Brief Narrative:  83 y.o. female with a hx of DM2, COPD on 3 L of oxygen baseline, SSS s/p dual-chamber pacemaker in 2017, CAD s/p CABG, R heart failure/cor pulmonale, tobacco abuse, and pleural effusions who resented with SOB w/ cough wheezing and chest congestion of sudden onset while sitting on the couch.  EMS was called and administered DuoNeb w/ improvement in sx.    Significant Events: 4/14 admit 4/14 venous duplex L LE negative for DVT  Subjective: Pt is seen for a f/u visit.    Assessment & Plan:  Acute on chronic hypoxic respiratory failure CTa suggestive of pulmonary edema and negative for PE  Hypotension  COPD severe disease with cor- pulmonale  Macrocytic anemia? Hgb dropped from 13.4 at admission to 8.4 - no obvious blood loss - suspect lab error - recheck now   HLD continue home meds  CAD s/p CABG 2004 Patent grafts per cath June 2017  HTN   Chronic recurrent B Pleural effusions   Hypokalemia  CHF  PVD s/p stenting of LICA and right carotid endarterectomy in 2003 with stenting of left common and external iliac in 2016    DVT prophylaxis: SCDs only until acute anemia clarified  Code Status: DNR - NO CODE Family Communication: no family present at time of exam  Disposition Plan:   Consultants:  none  Antimicrobials:  none   Objective: Blood pressure (!) 123/35, pulse 79, resp. rate (!) 21, height 5\' 1"  (1.549 m), weight 66.2 kg (146 lb), SpO2 98 %.  Intake/Output Summary (Last 24 hours) at 05/24/2017 1511 Last data filed at 05/24/2017 1149 Gross per 24 hour  Intake 150 ml  Output 400 ml  Net -250 ml   Filed Weights   05/23/17 2233  Weight: 66.2 kg (146 lb)    Examination: Pt seen for f/u visit.    CBC: Recent Labs  Lab 05/23/17 2252 05/24/17 0616  WBC 8.0 9.8  HGB 13.4 8.4*  HCT 40.9 26.2*  MCV  103.8* 102.7*  PLT 174 174   Basic Metabolic Panel: Recent Labs  Lab 05/23/17 2252 05/24/17 0616  NA 142 142  K 3.0* 3.6  CL 97* 100*  CO2 32 31  GLUCOSE 156* 111*  BUN 14 13  CREATININE 0.96 0.89  CALCIUM 9.3 8.5*  MG  --  2.4  PHOS  --  3.5   GFR: Estimated Creatinine Clearance: 43.9 mL/min (by C-G formula based on SCr of 0.89 mg/dL).  Liver Function Tests: Recent Labs  Lab 05/23/17 2252 05/24/17 0616  AST 18 14*  ALT 13* 11*  ALKPHOS 51 40  BILITOT 1.3* 0.7  PROT 7.1 5.4*  ALBUMIN 3.5 2.8*    Coagulation Profile: Recent Labs  Lab 05/23/17 2252  INR 1.02    Cardiac Enzymes: Recent Labs  Lab 05/24/17 0122 05/24/17 0616  TROPONINI <0.03 <0.03    HbA1C: Hgb A1c MFr Bld  Date/Time Value Ref Range Status  07/09/2012 11:30 AM 6.1 (H) <5.7 % Final    Comment:    (NOTE)  According to the ADA Clinical Practice Recommendations for 2011, when HbA1c is used as a screening test:  >=6.5%   Diagnostic of Diabetes Mellitus           (if abnormal result is confirmed) 5.7-6.4%   Increased risk of developing Diabetes Mellitus References:Diagnosis and Classification of Diabetes Mellitus,Diabetes Care,2011,34(Suppl 1):S62-S69 and Standards of Medical Care in         Diabetes - 2011,Diabetes Care,2011,34 (Suppl 1):S11-S61.     Scheduled Meds: . aspirin EC  81 mg Oral Daily  . atorvastatin  80 mg Oral Daily  . clopidogrel  75 mg Oral Daily  . enoxaparin (LOVENOX) injection  40 mg Subcutaneous Daily  . famotidine  20 mg Oral BID  . furosemide  40 mg Oral BID  . gabapentin  300 mg Oral TID  . guaiFENesin  600 mg Oral BID  . ipratropium-albuterol  3 mL Nebulization Q6H  . mometasone-formoterol  2 puff Inhalation BID  . sodium chloride flush  3 mL Intravenous Q12H     LOS: 0 days   Lonia Blood, MD [facility] Office  [phone] Pager - Text Page per Loretha Stapler as per  below:  On-Call/Text Page:      [email or web address]      password [email or web address]  If 7PM-7AM, please contact night-coverage [email or web address] Password [email or web address] 05/24/2017, 3:11 PM

## 2017-05-24 NOTE — Progress Notes (Signed)
*  Preliminary Results* Left lower extremity venous duplex completed. Left lower extremity is negative for deep vein thrombosis. There is no evidence of left Baker's cyst. Result notified RN Steward DroneBrenda by phone.   05/24/2017 9:08 AM  Hongying Miniya Miguez(RDMS RVT)

## 2017-05-24 NOTE — ED Notes (Signed)
Patient currently at CT scan .  

## 2017-05-24 NOTE — H&P (Signed)
Belinda Werner YQM:[id number] DOB: 1935-02-10 DOA: 05/23/2017     PCP: Angelica Chessman, MD   Outpatient Specialists:   CARDS:   Dr. Patty Sermons   Pulmonary    Dr.Ejaz [facility]   Patient arrived to ER on 05/23/17 at 2232  Patient coming from:  home Lives   With family    Chief Complaint:  Chief Complaint  Patient presents with  . Shortness of Breath    CHF/COPD    HPI: Belinda Werner is a 83 y.o. female with medical history significant of DM2, COPD on 3 L of oxygen baseline, sick sinus syndrome, status post dual-chamber pacemaker in 2017 history of CAD status post CABG,  cor pulmonale, tobacco abuse, pleural effusions, chronic respiratory failure with hypoxia    Presented with significant shortness of breath continues cough wheezing chest congestion. Reports sudden onset while sitting on the couch watching TV suddenly felt wrong, per family could not breathe. No worsening cough. EMS was called and administered DuoNeb.  Started around 8 PM breathing improved after DuoNeb. Denies associated fevers no chest pain At baseline patient has significant COPD and right-sided heart failure per cardiology office note her baseline is 100 feet of ambulation. Dry weight about 155 pounds. In February patient developed episodes of diarrhea resulting in 4 pound weight loss she  was diagnosed with irritable bowel syndrome no blood in his stool at that time. 2 weeks ago she was seen at urgent care diagnosed with CAP started on azithromycin and prednisone taper   Now back to baseline , no fever , no chills.   Regarding pertinent Chronic problems: History of CAD last cardiac catheterization done in 2017 showed patent bypass.  Echogram done at that time showed elevated right-sided pressures and abnormal septal wall motion., Known history of severe COPD FEV1 40%  associated CHF ongoing tobacco abuse While in ER: Tended to ambulate patient on 3 L she desaturated down to mid 80s X-ray  showed bilateral pleural effusions patient had bilateral lower extremity edema which point patient was given 40 mg of Lasix  Patient became hypotensive SBP 94  Following Medications were ordered in ER: Medications  magnesium sulfate IVPB 2 g 50 mL (2 g Intravenous New Bag/Given 05/24/17 0014)  ipratropium-albuterol (DUONEB) 0.5-2.5 (3) MG/3ML nebulizer solution 3 mL (3 mLs Nebulization Given 05/23/17 2335)  potassium chloride SA (K-DUR,KLOR-CON) CR tablet 40 mEq (40 mEq Oral Given 05/23/17 2335)  furosemide (LASIX) injection 40 mg (40 mg Intravenous Given 05/24/17 0014)    Significant initial  Findings: Abnormal Labs Reviewed  BASIC METABOLIC PANEL - Abnormal; Notable for the following components:      Result Value   Potassium 3.0 (*)    Chloride 97 (*)    Glucose, Bld 156 (*)    GFR calc non Af Amer 54 (*)    All other components within normal limits  CBC - Abnormal; Notable for the following components:   MCV 103.8 (*)    All other components within normal limits  BRAIN NATRIURETIC PEPTIDE - Abnormal; Notable for the following components:   B Natriuretic Peptide 225.7 (*)    All other components within normal limits     Na 142 K 3.0  Cr    stable,   Lab Results  Component Value Date   CREATININE 0.96 05/23/2017   CREATININE 0.90 09/30/2015   CREATININE 1.11 (H) 07/16/2015      WBC 8.0  HG/HCT   stable,      Component Value Date/Time  HGB 13.4 05/23/2017 2252   HCT 40.9 05/23/2017 2252     Troponin (Point of Care Test) Recent Labs    05/23/17 2256  TROPIPOC 0.01     BNP (last 3 results) Recent Labs    05/23/17 2252  BNP 225.7*       UA  ordered    CXR -small pleural effusions increased peribronchial thickening bronchitic change versus pulmonary edema decreased fluid in minor fissure possible fluid overload   ECG:  Personally reviewed by me showing: HR : 87 Rhythm:  NSR  no evidence of ischemic changes [id number]    ED Triage Vitals  Enc Vitals  Group     BP 05/23/17 2245 107/77     Pulse Rate 05/23/17 2245 85     Resp 05/23/17 2245 (!) 25     Temp --      Temp src --      SpO2 05/23/17 2245 91 %     Weight 05/23/17 2233 146 lb (66.2 kg)     Height 05/23/17 2233 5\' 1"  (1.549 m)     Head Circumference --      Peak Flow --      Pain Score 05/23/17 2258 0     Pain Loc --      Pain Edu? --      Excl. in GC? --   TMAX(24)@       Latest  Blood pressure (!) 98/33, pulse 81, resp. rate 19, height 5\' 1"  (1.549 m), weight 66.2 kg (146 lb), SpO2 93 %.     Hospitalist was called for admission for acute on chronic respiratory failure secondary to possible CHF exacerbation versus COPD exacerbation   Review of Systems:    Pertinent positives include:  shortness of breath at rest.  dyspnea on exertion  Constitutional:  No weight loss, night sweats, Fevers, chills, fatigue, weight loss  HEENT:  No headaches, Difficulty swallowing,Tooth/dental problems,Sore throat,  No sneezing, itching, ear ache, nasal congestion, post nasal drip,  Cardio-vascular:  No chest pain, Orthopnea, PND, anasarca, dizziness, palpitations.no Bilateral lower extremity swelling  GI:  No heartburn, indigestion, abdominal pain, nausea, vomiting, diarrhea, change in bowel habits, loss of appetite, melena, blood in stool, hematemesis Resp:   No excess mucus, no productive cough, No non-productive cough, No coughing up of blood.No change in color of mucus.No wheezing. Skin:  no rash or lesions. No jaundice GU:  no dysuria, change in color of urine, no urgency or frequency. No straining to urinate.  No flank pain.  Musculoskeletal:  No joint pain or no joint swelling. No decreased range of motion. No back pain.  Psych:  No change in mood or affect. No depression or anxiety. No memory loss.  Neuro: no localizing neurological complaints, no tingling, no weakness, no double vision, no gait abnormality, no slurred speech, no confusion  As per HPI otherwise 10  point review of systems negative.   Past Medical History:   Past Medical History:  Diagnosis Date  . CAD (coronary artery disease)    sig CAD previously in RCA, Cx; history of 3-4 stents and h/o CABG  . COPD (chronic obstructive pulmonary disease) (HCC)   . Diastolic dysfunction    echo 09/2008 EF 55%, mild to mod LVH, LA mild to mod dilated  . Hypercholesterolemia   . Hypertension   . MI (myocardial infarction) (HCC)    history  . Pneumonia   . PVD (peripheral vascular disease) (HCC)    refused amputation in the  past       Past Surgical History:  Procedure Laterality Date  . ABDOMINAL HYSTERECTOMY    . APPENDECTOMY    . CHOLECYSTECTOMY    . CORONARY ARTERY BYPASS GRAFT    . OTHER SURGICAL HISTORY     ?heart valve replacement  . PERIPHERAL VASCULAR CATHETERIZATION N/A 09/26/2014   Procedure: Abdominal Aortogram;  Surgeon: Nada LibmanVance W Brabham, MD;  Location: [facility];  Service: Cardiovascular;  Laterality: N/A;  . TONSILLECTOMY      Social History:  Ambulatory      reports that she has been smoking cigarettes.  She has a 25.00 pack-year smoking history. She has never used smokeless tobacco. She reports that she does not drink alcohol or use drugs.     Family History:   Family History  Problem Relation Age of Onset  . Diabetes Mother   . Varicose Veins Mother   . Cancer Father   . Diabetes Daughter   . Heart disease Daughter   . Hypertension Daughter   . Heart attack Daughter        before age 81  . Heart disease Son   . Hypertension Son   . Heart attack Son        before age 81  . Cancer Sister     Allergies: Allergies  Allergen Reactions  . Avelox [Moxifloxacin Hcl In Nacl] Hives and Other (See Comments)    hives  . Penicillins Hives     Prior to Admission medications   Medication Sig Start Date End Date Taking? Authorizing Provider  aspirin 81 MG tablet Take 81 mg by mouth daily.    [provider]  atorvastatin (LIPITOR) 80 MG  tablet Take 80 mg by mouth daily.      [provider]  clopidogrel (PLAVIX) 75 MG tablet Take 75 mg by mouth daily.      [provider]  doxycycline (VIBRAMYCIN) 100 MG capsule Take 1 capsule (100 mg total) by mouth 2 (two) times daily. Patient not taking: Reported on 11/06/2016 09/30/15   Barrett HenleNadeau, Nicole Elizabeth, PA-C  famotidine (PEPCID) 20 MG tablet Take 20 mg by mouth 2 (two) times daily.      [provider]  fluticasone (FLONASE) 50 MCG/ACT nasal spray Place 1 spray into both nostrils daily.  07/22/13   [provider]  fluticasone-salmeterol (ADVAIR HFA) 115-21 MCG/ACT inhaler Inhale 2 puffs into the lungs 2 (two) times daily.      [provider]  furosemide (LASIX) 40 MG tablet Take 40 mg by mouth 2 (two) times daily. 07/11/12   Lollie SailsWallace, Andrew B, MD  gabapentin (NEURONTIN) 100 MG capsule Take 100 mg by mouth 3 (three) times daily. 07/22/13   [provider]  isosorbide dinitrate (ISORDIL) 30 MG tablet Take 30 mg by mouth daily.     [provider]  lisinopril (PRINIVIL,ZESTRIL) 10 MG tablet Take 1 tablet (10 mg total) by mouth daily. 07/11/12   Lollie SailsWallace, Andrew B, MD  losartan (COZAAR) 50 MG tablet Take 50 mg by mouth daily.    [provider]  metoprolol (TOPROL-XL) 100 MG 24 hr tablet Take 100 mg by mouth daily.      [provider]  nebivolol (BYSTOLIC) 10 MG tablet Take 10 mg by mouth daily.    [provider]  potassium chloride SA (K-DUR,KLOR-CON) 20 MEQ tablet Take 2 tablets (40 mEq total) by mouth daily. 07/11/12   Lollie SailsWallace, Andrew B, MD  PROVENTIL HFA 108 [phone](90  BASE) MCG/ACT inhaler Inhale 2 puffs into the lungs every 4 (four) hours as needed for wheezing or shortness of breath.  05/29/14   [provider]  tiotropium (SPIRIVA) 18 MCG inhalation capsule Place 18 mcg into inhaler and inhale daily.      [provider]   Physical Exam: Blood pressure (!) 98/33, pulse 81, resp. rate 19,  height 5\' 1"  (1.549 m), weight 66.2 kg (146 lb), SpO2 93 %. 1. General:  in No Acute distress   Chronically ill -appearing 2. Psychological: Alert and   Oriented 3. Head/ENT:     Dry Mucous Membranes                          Head Non traumatic, neck supple                           Poor Dentition 4. SKIN:  decreased Skin turgor,  Skin clean Dry and intact no rash 5. Heart: Regular rate and rhythm systolic  Murmur, no Rub or gallop 6. Lungs:  dsitant no wheezes or crackles   7. Abdomen: Soft,  non-tender, Non distended   obese  bowel sounds present 8. Lower extremities: no clubbing, cyanosis,   Edema left >right 9. Neurologically   strength 5 out of 5 in all 4 extremities cranial nerves II through XII intact 10. MSK: Normal range of motion   LABS:     Recent Labs  Lab 05/23/17 2252  WBC 8.0  HGB 13.4  HCT 40.9  MCV 103.8*  PLT 174   Basic Metabolic Panel: Recent Labs  Lab 05/23/17 2252  NA 142  K 3.0*  CL 97*  CO2 32  GLUCOSE 156*  BUN 14  CREATININE 0.96  CALCIUM 9.3    Cultures:    Component Value Date/Time   SDES URINE, CATHETERIZED 07/09/2012 0853   SPECREQUEST NONE 07/09/2012 0853   CULT NO GROWTH 07/09/2012 0853   REPTSTATUS 07/10/2012 FINAL 07/09/2012 0853     Radiological Exams on Admission: Dg Chest 2 View  Result Date: 05/23/2017 CLINICAL DATA:  Shortness of breath. EXAM: CHEST - 2 VIEW COMPARISON:  05/07/2017 FINDINGS: Left-sided pacemaker in place. Post median sternotomy. Unchanged heart size and mediastinal contours. Decreasing linear right midlung opacity with mild residual, likely fluid in the fissure. There are small bilateral pleural effusions are new. Increased peribronchial thickening from prior exam. No confluent airspace disease. No pneumothorax. Bones are under mineralized. IMPRESSION: Small pleural effusions, new from prior. Increased peribronchial thickening from prior exam may be bronchitic change or pulmonary edema. Slight decrease fluid  in the minor fissure. Suspect a degree of fluid overload. Electronically Signed   By: Rubye Oaks M.D.   On: 05/23/2017 23:28    Chart has been reviewed    Assessment/Plan  83 y.o. female with medical history significant of DM2, COPD on 3 L of oxygen baseline, sick sinus syndrome, status post dual-chamber pacemaker in 2017 history of CAD status post CABG,  cor pulmonale, tobacco abuse, pleural effusions, chronic respiratory failure with hypoxia     Admitted for acute on chronic respiratory failure and hypotension Present on Admission: . Acute respiratory failure with hypoxia (HCC) - no syncope or chest pain, given sudden onset will obtain CT a chest to rule out PE. Given partial improvement with nebulizer treatment will continue PRN no wheezing at this point to suggest COPD exacerbation.  Cycle CE, obtain echo  Hypotension - could be chronic but worsened after IV lasix, hold off on further over aggressive diuresis . COPD (chronic obstructive pulmonary disease) (HCC) - severe disease with probable cor- pulmonale.   Continue home medications. Would benefit from close follow up with pulmonology, given sudent onset of symptoms and rapid resolution doubt COPD exacerbation . HLD (hyperlipidemia) - stable continue home meds . HTN (hypertension) - soft blood pressures. Hold home meds for tonight . Pleural effusion - hx of bilateral plural effusions in the past . Hypokalemia - will replace and monitor on tele CHF- likely right heart failure given, hypotension will hold off on further agressive diuresis, obtain echo. Would benefit from cardiology input.  PVD - on Plavix will continue LEFT leg edema - given decreased mobility order doppler  Other plan as per orders.  DVT prophylaxis:    Lovenox     Code Status:    DNR/DNI   as per patient   I had personally discussed CODE STATUS with patient and family     Family Communication:   Family  at  Bedside  plan of care was discussed with   Daughters,   Disposition Plan:      To home once workup is complete and patient is stable                                                   Consults called: email cardiology   Admission status:   inpatient   Level of care  Given hypotension monitor in stepdown over night         Belinda Werner 05/24/2017, 2:11 AM    [facility]  Pager [phone]   after 2 AM please page floor coverage PA If 7AM-7PM, please contact the day team taking care of the patient  [email or web address]  Password [email or web address]

## 2017-05-24 NOTE — ED Notes (Signed)
Unable to pull off IV for blood work. Attempted to draw blood without success. Phlebotomy notified of need for them to collect troponin.

## 2017-05-25 ENCOUNTER — Inpatient Hospital Stay (HOSPITAL_COMMUNITY): Payer: Medicare Other

## 2017-05-25 ENCOUNTER — Telehealth: Payer: Self-pay | Admitting: Cardiology

## 2017-05-25 DIAGNOSIS — R0989 Other specified symptoms and signs involving the circulatory and respiratory systems: Secondary | ICD-10-CM

## 2017-05-25 DIAGNOSIS — I509 Heart failure, unspecified: Secondary | ICD-10-CM

## 2017-05-25 DIAGNOSIS — I340 Nonrheumatic mitral (valve) insufficiency: Secondary | ICD-10-CM

## 2017-05-25 DIAGNOSIS — R0902 Hypoxemia: Secondary | ICD-10-CM

## 2017-05-25 LAB — CBC
HCT: 31 % — ABNORMAL LOW (ref 36.0–46.0)
Hemoglobin: 9.8 g/dL — ABNORMAL LOW (ref 12.0–15.0)
MCH: 32.9 pg (ref 26.0–34.0)
MCHC: 31.6 g/dL (ref 30.0–36.0)
MCV: 104 fL — AB (ref 78.0–100.0)
PLATELETS: 166 10*3/uL (ref 150–400)
RBC: 2.98 MIL/uL — ABNORMAL LOW (ref 3.87–5.11)
RDW: 13.8 % (ref 11.5–15.5)
WBC: 8.5 10*3/uL (ref 4.0–10.5)

## 2017-05-25 LAB — BASIC METABOLIC PANEL
Anion gap: 12 (ref 5–15)
BUN: 9 mg/dL (ref 6–20)
CHLORIDE: 98 mmol/L — AB (ref 101–111)
CO2: 31 mmol/L (ref 22–32)
CREATININE: 0.81 mg/dL (ref 0.44–1.00)
Calcium: 8.9 mg/dL (ref 8.9–10.3)
GFR calc Af Amer: >60 mL/min (ref >60)
Glucose, Bld: 113 mg/dL — ABNORMAL HIGH (ref 65–99)
Potassium: 3.5 mmol/L (ref 3.5–5.1)
SODIUM: 141 mmol/L (ref 135–145)

## 2017-05-25 LAB — RETICULOCYTES
RBC.: 2.98 MIL/uL — AB (ref 3.87–5.11)
RETIC COUNT ABSOLUTE: 59.6 10*3/uL (ref 19.0–186.0)
Retic Ct Pct: 2 % (ref 0.4–3.1)

## 2017-05-25 LAB — IRON AND TIBC
Iron: 64 ug/dL (ref 28–170)
Saturation Ratios: 30 % (ref 10.4–31.8)
TIBC: 214 ug/dL — ABNORMAL LOW (ref 250–450)
UIBC: 150 ug/dL

## 2017-05-25 LAB — HEMOGLOBIN AND HEMATOCRIT, BLOOD
HCT: 32.3 % — ABNORMAL LOW (ref 36.0–46.0)
Hemoglobin: 10.1 g/dL — ABNORMAL LOW (ref 12.0–15.0)

## 2017-05-25 LAB — FERRITIN: FERRITIN: 141 ng/mL (ref 11–307)

## 2017-05-25 LAB — ECHOCARDIOGRAM COMPLETE
Height: 61 in
Weight: 2320 oz

## 2017-05-25 LAB — FOLATE: Folate: 35.4 ng/mL (ref >5.9)

## 2017-05-25 LAB — TROPONIN I
Troponin I: <0.03 ng/mL (ref <0.03)
Troponin I: <0.03 ng/mL (ref <0.03)

## 2017-05-25 LAB — BRAIN NATRIURETIC PEPTIDE: B Natriuretic Peptide: 378.1 pg/mL — ABNORMAL HIGH (ref 0.0–100.0)

## 2017-05-25 LAB — MAGNESIUM: MAGNESIUM: 2.4 mg/dL (ref 1.7–2.4)

## 2017-05-25 LAB — VITAMIN B12: Vitamin B-12: 369 pg/mL (ref 180–914)

## 2017-05-25 MED ORDER — FUROSEMIDE 10 MG/ML IJ SOLN: 40.0000 mg | Freq: Two times a day (BID) | INTRAMUSCULAR | Status: DC

## 2017-05-25 MED ORDER — IPRATROPIUM-ALBUTEROL 0.5-2.5 (3) MG/3ML IN SOLN
3.0000 mL | Freq: Three times a day (TID) | RESPIRATORY_TRACT | Status: DC
  Administered 2017-05-26 – 2017-05-27 (×4): 3 mL via RESPIRATORY_TRACT
  Filled 2017-05-25 (×5): qty 3

## 2017-05-25 MED ORDER — FUROSEMIDE 10 MG/ML IJ SOLN
40.0000 mg | Freq: Two times a day (BID) | INTRAMUSCULAR | Status: DC
Start: 2017-05-25 — End: 2017-05-27
  Administered 2017-05-25 – 2017-05-27 (×4): 40 mg via INTRAVENOUS
  Filled 2017-05-25 (×4): qty 4

## 2017-05-25 MED ORDER — SODIUM CHLORIDE 0.9 % IV BOLUS
250.0000 mL | Freq: Once | INTRAVENOUS | Status: AC
  Administered 2017-05-25: 250 mL via INTRAVENOUS

## 2017-05-25 MED ORDER — FUROSEMIDE 10 MG/ML IJ SOLN
20.0000 mg | Freq: Two times a day (BID) | INTRAMUSCULAR | Status: DC
  Administered 2017-05-25: 20 mg via INTRAVENOUS
  Filled 2017-05-25: qty 2

## 2017-05-25 MED ORDER — METHYLPREDNISOLONE SODIUM SUCC 125 MG IJ SOLR
60.0000 mg | Freq: Two times a day (BID) | INTRAMUSCULAR | Status: DC
  Administered 2017-05-25 – 2017-05-27 (×5): 60 mg via INTRAVENOUS
  Filled 2017-05-25 (×5): qty 2

## 2017-05-25 NOTE — Progress Notes (Signed)
OT Cancellation Note  Patient Details Name: Belinda SeltzerCarol Werner MRN: [id number] DOB: 1934-12-05   Cancelled Treatment:    Reason Eval/Treat Not Completed: Patient at procedure or test/ unavailable. Pt off unit for echo. Will check back as able.   Doristine Sectionharity A Kynlei Piontek, MS OTR/L  Pager: [phone]   Alona Danford A Cassidie Veiga 05/25/2017, 3:30 PM

## 2017-05-25 NOTE — Progress Notes (Signed)
PROGRESS NOTE    Belinda SeltzerCarol Werner  ZOX:[id number] DOB: 1934-07-14 DOA: 05/23/2017 PCP: Angelica ChessmanAguiar, Rafaela M, MD (Confirm with patient/family/NH records and if not entered, this HAS to [facility] point [facility]. "No PCP" if truly none.)   Brief Narrative:Belinda Werner is a 83 y.o. female with medical history significant of DM2, COPD on 3 L of oxygen baseline, sick sinus syndrome, status post dual-chamber pacemaker in 2017 history of CAD status post CABG,  cor pulmonale, tobacco abuse, pleural effusions, chronic respiratory failure with hypoxia. Presented with significant shortness of breath continues cough wheezing chest congestion. Reports sudden onset while sitting on the couch watching TV suddenly felt wrong, per family could not breathe.  2 weeks ago she was seen at urgent care diagnosed with CAP started on azithromycin and prednisone taper. Admitted with CHF exacerbation, worsening hypoxemia. She was improving. Overnight became more hypoxic.   Assessment & Plan:   Active Problems:   Acute on chronic congestive heart failure with left ventricular diastolic dysfunction (HCC)   HLD (hyperlipidemia)   COPD (chronic obstructive pulmonary disease) (HCC)   Pleural effusion   HTN (hypertension)   Hypokalemia   Acute respiratory failure with hypoxia (HCC)   Respiratory failure (HCC)  1-Acute hypoxic Respiratory Failure; in setting of COPD exacerbation and HF exacerbation.  Dry weight 155. Weight today 145 CT angio; NO PE. Pulmonary edema, right middle lobe scarring.  Hb stable at 9.  Chest x ary; mild edema and basilar atelectasis.  Will order IV solumedrol Q 12 hours.  IV lasix 20 mg IV daily. At least until evaluated by cardiology. Per admission HPI she became hypotensive after lasix.  Check BNP, cycle cardiac enzymes.   2-Hypotension; holder parameter for Imdur and metoprolol.  Repeat Hb.  SBP now in the 100   3-PVD; on plavix.   4-COPD exacerbation; continue with schedule  nebulizer, IV solumedrol.   5-IBS; monitor.   6-Acute on chronic combine systolic diastolic HF exacerbation;  More hypoxic.  Change lasix to IV daily.  Cardiology was informed of patient change in condition.  Check BNP.  ECHO pending.   7-Anemia; of chronic diseases;  Hb last year around 10.  Hb stable 10---9. Suspect 13 on admission not accurate.   DVT prophylaxis: SCD Code Status: DNR Family Communication: care discussed with patient.  Disposition Plan: home when stable.   Consultants:   cardi logy    Procedures:   ECHO; pending 4/14 venous duplex L LE negative for DVT     Antimicrobials:  none  Subjective: She is not doing well, she wanted to go home today. Last night she started to have worsening chest pressure, tightness, SOB. She was more hypoxic and SBP was Soft.   Objective: Vitals:   05/25/17 0526 05/25/17 0529 05/25/17 0532 05/25/17 0614  BP:   (!) 110/42 (!) 118/96  Pulse:  77 78 77  Resp:      Temp:      TempSrc:      SpO2: (!) 88% (!) 87% 91% 93%  Weight:      Height:        Intake/Output Summary (Last 24 hours) at 05/25/2017 0810 Last data filed at 05/24/2017 1149 Gross per 24 hour  Intake -  Output 400 ml  Net -400 ml   Filed Weights   05/23/17 2233 05/25/17 0423  Weight: 66.2 kg (146 lb) 65.8 kg (145 lb)    Examination:  General exam: Appears calm Respiratory system: Mild tachypnea, Bilateral crackles, decreased  breath sounds, wheezing on he left.  Cardiovascular system: S1 & S2 heard, RRR. No JVD, murmurs, rubs, gallops or clicks. No pedal edema. Gastrointestinal system: Abdomen is nondistended, soft and nontender. No organomegaly or masses felt. Normal bowel sounds heard. Central nervous system: Alert and oriented. No focal neurological deficits. Extremities: Symmetric 5 x 5 power. Skin: No rashes, lesions or ulcers Psychiatry: Judgement and insight appear normal. Mood & affect appropriate.     Data Reviewed: I have  personally reviewed following labs and imaging studies  CBC: Recent Labs  Lab 05/23/17 2252 05/24/17 0616 05/24/17 1630 05/25/17 0446  WBC 8.0 9.8 8.0 8.5  HGB 13.4 8.4* 10.3* 9.8*  HCT 40.9 26.2* 32.9* 31.0*  MCV 103.8* 102.7* 103.8* 104.0*  PLT 174 174 146* 166   Basic Metabolic Panel: Recent Labs  Lab 05/23/17 2252 05/24/17 0616 05/25/17 0446  NA 142 142 141  K 3.0* 3.6 3.5  CL 97* 100* 98*  CO2 32 31 31  GLUCOSE 156* 111* 113*  BUN 14 13 9   CREATININE 0.96 0.89 0.81  CALCIUM 9.3 8.5* 8.9  MG  --  2.4 2.4  PHOS  --  3.5  --    GFR: Estimated Creatinine Clearance: 48.1 mL/min (by C-G formula based on SCr of 0.81 mg/dL). Liver Function Tests: Recent Labs  Lab 05/23/17 2252 05/24/17 0616  AST 18 14*  ALT 13* 11*  ALKPHOS 51 40  BILITOT 1.3* 0.7  PROT 7.1 5.4*  ALBUMIN 3.5 2.8*   No results for input(s): LIPASE, AMYLASE in the last 168 hours. No results for input(s): AMMONIA in the last 168 hours. Coagulation Profile: Recent Labs  Lab 05/23/17 2252  INR 1.02   Cardiac Enzymes: Recent Labs  Lab 05/24/17 0122 05/24/17 0616 05/24/17 1652  TROPONINI <0.03 <0.03 <0.03   BNP (last 3 results) No results for input(s): PROBNP in the last 8760 hours. HbA1C: No results for input(s): HGBA1C in the last 72 hours. CBG: No results for input(s): GLUCAP in the last 168 hours. Lipid Profile: No results for input(s): CHOL, HDL, LDLCALC, TRIG, CHOLHDL, LDLDIRECT in the last 72 hours. Thyroid Function Tests: Recent Labs    05/24/17 0616  TSH 2.389   Anemia Panel: Recent Labs    05/25/17 0446  VITAMINB12 369  FOLATE 35.4  FERRITIN 141  TIBC 214*  IRON 64  RETICCTPCT 2.0   Sepsis Labs: No results for input(s): PROCALCITON, LATICACIDVEN in the last 168 hours.  No results found for this or any previous visit (from the past 240 hour(s)).       Radiology Studies: Dg Chest 2 View  Result Date: 05/23/2017 CLINICAL DATA:  Shortness of breath. EXAM:  CHEST - 2 VIEW COMPARISON:  05/07/2017 FINDINGS: Left-sided pacemaker in place. Post median sternotomy. Unchanged heart size and mediastinal contours. Decreasing linear right midlung opacity with mild residual, likely fluid in the fissure. There are small bilateral pleural effusions are new. Increased peribronchial thickening from prior exam. No confluent airspace disease. No pneumothorax. Bones are under mineralized. IMPRESSION: Small pleural effusions, new from prior. Increased peribronchial thickening from prior exam may be bronchitic change or pulmonary edema. Slight decrease fluid in the minor fissure. Suspect a degree of fluid overload. Electronically Signed   By: Rubye Oaks M.D.   On: 05/23/2017 23:28   Ct Angio Chest Pe W Or Wo Contrast  Result Date: 05/24/2017 CLINICAL DATA:  PE suspected, high pretest prob. Cough and shortness of breath. EXAM: CT ANGIOGRAPHY CHEST WITH CONTRAST TECHNIQUE: Multidetector  CT imaging of the chest was performed using the standard protocol during bolus administration of intravenous contrast. Multiplanar CT image reconstructions and MIPs were obtained to evaluate the vascular anatomy. CONTRAST:  59 cc ISOVUE-370 IOPAMIDOL (ISOVUE-370) INJECTION 76% COMPARISON:  Radiographs yesterday.  Chest CT 09/30/2015 FINDINGS: Cardiovascular: There are no filling defects within the pulmonary arteries to suggest pulmonary embolus. Multi chamber cardiomegaly. Aortic atherosclerosis without aneurysm or dissection. Stenosis at the origin of the left subclavian artery, as seen on prior exam. There are native coronary artery calcifications, post CABG. Pacemaker with leads in the right atrium and ventricle. Mediastinum/Nodes: Prominent right hilar node measures 12 mm short axis. Lower paratracheal node slightly decreased from prior measuring 18 mm, previously 21. Additional small mediastinal lymph nodes are similar to prior exam. Small hiatal hernia. No thyroid nodule. Lungs/Pleura: Mild  emphysema. Mild septal thickening consistent with pulmonary edema. There is central bronchial thickening that may be congestive or bronchitic. Linear opacities in the right upper and middle lobes consistent with scarring. No significant pleural effusion. Dependent atelectasis in both lungs. Upper Abdomen: Stent in the upper abdominal aorta is partially included but grossly stable from 2017 CT. No acute findings. Musculoskeletal: Chronic T5 and T6 compression fractures. Post median sternotomy. No acute osseous abnormality. Review of the MIP images confirms the above findings. IMPRESSION: 1. No pulmonary embolus. 2. Smooth septal thickening consistent with pulmonary edema, bronchial thickening may be secondary to pulmonary edema or bronchitic change. 3. Cardiomegaly. Aortic atherosclerosis. Post CABG with calcification of the native coronary arteries. 4. Right middle and upper lobe scarring. Aortic Atherosclerosis (ICD10-I70.0) and Emphysema (ICD10-J43.9). Electronically Signed   By: Rubye Oaks M.D.   On: 05/24/2017 03:11   Dg Chest Port 1 View  Result Date: 05/25/2017 CLINICAL DATA:  Hypoxemia. EXAM: PORTABLE CHEST 1 VIEW COMPARISON:  CT chest 05/24/2017 FINDINGS: The heart size is normal. Atherosclerotic calcifications are present the aortic arch. CABG is noted. Mild edema is again noted. Bibasilar atelectasis is evident. IMPRESSION: 1. Stable appearance of mild edema and bibasilar atelectasis. Electronically Signed   By: Marin Roberts M.D.   On: 05/25/2017 08:01        Scheduled Meds: . aspirin EC  81 mg Oral Daily  . atorvastatin  80 mg Oral Daily  . clopidogrel  75 mg Oral Daily  . famotidine  20 mg Oral BID  . furosemide  40 mg Intravenous BID  . gabapentin  300 mg Oral TID  . guaiFENesin  600 mg Oral BID  . ipratropium-albuterol  3 mL Nebulization Q6H  . isosorbide dinitrate  10 mg Oral BID  . methylPREDNISolone (SOLU-MEDROL) injection  60 mg Intravenous Q12H  . metoprolol  tartrate  12.5 mg Oral BID  . mometasone-formoterol  2 puff Inhalation BID   Continuous Infusions:   LOS: 1 day    Time spent: 35 minutes.     Alba Cory, MD [facility] Pager [phone]  If 7PM-7AM, please contact night-coverage [email or web address] Password [email or web address] 05/25/2017, 8:10 AM

## 2017-05-25 NOTE — Telephone Encounter (Signed)
Patient daughter Belinda Werner(Ellen) calling, states that her mother is in the hospital and would like to give you some information.

## 2017-05-25 NOTE — Progress Notes (Signed)
RT in room.

## 2017-05-25 NOTE — Evaluation (Addendum)
Physical Therapy Evaluation Patient Details Name: Belinda Werner MRN: [id number] DOB: [birth year] Today's Date: 05/25/2017   History of Present Illness  Pt is an 83 y.o. female admitted 05/23/17 for acute hypoxic respiratory failure in setting of COPD and HF exacerbation. Of note, seen in urgent care two weeks ago with CAP. PMH includes PVD, HTN, CAD.     Clinical Impression  Pt presents with an overall decrease in functional mobility secondary to above. PTA, pt indep with intermittent use of RW; lives with son available for 24/7 support. Today, pt able to amb in room with no DME, and an additional 300' with RW and supervision for safety. Initial drop in BP upon standing (see values below). Educ on energy conservation and fall risk reduction. Pt would benefit from continued acute PT services to maximize functional mobility and independence prior to d/c with HHPT services.  Resting SpO2 95% on 5L O2; down to 80% on 4L O2 while ambulating; fluctuated from 82-94% amb on 6L O2 [place]  Supine BP 141/39 Sitting BP 133/35 Standing BP 99/46 Standing 3 min BP 113/48 Post-amb BP 152/134     Follow Up Recommendations Home health PT;Supervision - Intermittent    Equipment Recommendations  None recommended by PT    Recommendations for Other Services OT consult     Precautions / Restrictions Precautions Precautions: Fall Precaution Comments: Watch SpO2 Restrictions Weight Bearing Restrictions: No      Mobility  Bed Mobility Overal bed mobility: Modified Independent             General bed mobility comments: HOB elevated  Transfers Overall transfer level: Needs assistance Equipment used: None Transfers: Sit to/from Stand Sit to Stand: Min guard;Supervision         General transfer comment: Stood 3x with no UE support, progressing to supervision for safety  Ambulation/Gait Ambulation/Gait assistance: Min guard;Supervision Ambulation Distance (Feet): 300 Feet Assistive device:  None;Rolling walker (2 wheeled) Gait Pattern/deviations: Step-through pattern;Decreased stride length Gait velocity: Decreased   General Gait Details: Initial amb in room with no DME, pt reaching for intermittent UE support and min guard for balance. Amb an additional 300' with RW and supervision for safety. Multiple standing rest breaks secondary to SpO2 <88% on 6L O2 [place], returning to >86% with pursed lip breathing and rest  Stairs            Wheelchair Mobility    Modified Rankin (Stroke Patients Only)       Balance Overall balance assessment: Needs assistance   Sitting balance-Leahy Scale: Good Sitting balance - Comments: Indep to don underwear in sitting EOB/standing     Standing balance-Leahy Scale: Good                               Pertinent Vitals/Pain Pain Assessment: No/denies pain    Home Living Family/patient expects to be discharged to:: Private residence Living Arrangements: Children(Son) Available Help at Discharge: Family;Available 24 hours/day Type of Home: House Home Access: Stairs to enter   [employer] of Steps: 1 threshold Home Layout: One level Home Equipment: Walker - 2 wheels;Bedside commode;Shower seat Additional Comments: Uses BSC over toilet seat    Prior Function Level of Independence: Independent         Comments: Uses RW first thing in morning due to BLE stiffness/soreness, but does not use majority of the day. Indep with community amb and ADLs     Hand Dominance  Extremity/Trunk Assessment   Upper Extremity Assessment Upper Extremity Assessment: Overall WFL for tasks assessed    Lower Extremity Assessment Lower Extremity Assessment: Overall WFL for tasks assessed    Cervical / Trunk Assessment Cervical / Trunk Assessment: Kyphotic  Communication   Communication: No difficulties  Cognition Arousal/Alertness: Awake/alert Behavior During Therapy: WFL for tasks assessed/performed Overall  Cognitive Status: Within Functional Limits for tasks assessed                                        General Comments      Exercises     Assessment/Plan    PT Assessment Patient needs continued PT services  PT Problem List Decreased activity tolerance;Decreased balance;Decreased mobility;Cardiopulmonary status limiting activity       PT Treatment Interventions DME instruction;Gait training;Stair training;Functional mobility training;Therapeutic activities;Therapeutic exercise;Balance training;Patient/family education    PT Goals (Current goals can be found in the Care Plan section)  Acute Rehab PT Goals Patient Stated Goal: Return home PT Goal Formulation: With patient Time For Goal Achievement: 06/08/17 Potential to Achieve Goals: Good    Frequency Min 3X/week   Barriers to discharge        Co-evaluation               AM-PAC PT "6 Clicks" Daily Activity  Outcome Measure Difficulty turning over in bed (including adjusting bedclothes, sheets and blankets)?: None Difficulty moving from lying on back to sitting on the side of the bed? : A Little Difficulty sitting down on and standing up from a chair with arms (e.g., wheelchair, bedside commode, etc,.)?: A Little Help needed moving to and from a bed to chair (including a wheelchair)?: A Little Help needed walking in hospital room?: A Little Help needed climbing 3-5 steps with a railing? : A Little 6 Click Score: 19    End of Session Equipment Utilized During Treatment: Gait belt;Oxygen Activity Tolerance: Patient tolerated treatment well Patient left: in chair;with call bell/phone within reach Nurse Communication: Mobility status PT Visit Diagnosis: Other abnormalities of gait and mobility (R26.89)    Time: [id number] PT Time Calculation (min) (ACUTE ONLY): 35 min   Charges:   PT Evaluation $PT Eval Moderate Complexity: 1 Mod PT Treatments $Therapeutic Activity: 8-22 mins   PT G Codes:        Ina HomesJaclyn Leiliana Foody, PT, DPT Acute Rehab Services  Pager: [phone]  Malachy ChamberJaclyn L Sueko Dimichele 05/25/2017, 9:29 AM

## 2017-05-25 NOTE — Progress Notes (Signed)
Progress Note  Patient Name: Belinda Werner Date of Encounter: 05/25/2017  Primary Cardiologist: No primary care provider on file.   Subjective   She feels better but still SOB.  Inpatient Medications    Scheduled Meds: . aspirin EC  81 mg Oral Daily  . atorvastatin  80 mg Oral Daily  . clopidogrel  75 mg Oral Daily  . famotidine  20 mg Oral BID  . furosemide  20 mg Intravenous BID  . gabapentin  300 mg Oral TID  . guaiFENesin  600 mg Oral BID  . ipratropium-albuterol  3 mL Nebulization Q6H  . isosorbide dinitrate  10 mg Oral BID  . methylPREDNISolone (SOLU-MEDROL) injection  60 mg Intravenous BID  . metoprolol tartrate  12.5 mg Oral BID  . mometasone-formoterol  2 puff Inhalation BID   Continuous Infusions:  PRN Meds: acetaminophen **OR** acetaminophen, albuterol, HYDROcodone-acetaminophen, ondansetron **OR** ondansetron (ZOFRAN) IV   Vital Signs    Vitals:   05/25/17 0614 05/25/17 1119 05/25/17 1122 05/25/17 1432  BP: (!) 118/96 (!) 138/56 (!) 142/112 112/82  Pulse: 77  94 83  Resp:      Temp:      TempSrc:      SpO2: 93%  (!) 78%   Weight:      Height:        Intake/Output Summary (Last 24 hours) at 05/25/2017 1439 Last data filed at 05/25/2017 1036 Gross per 24 hour  Intake 485 ml  Output -  Net 485 ml   Filed Weights   05/23/17 2233 05/25/17 0423  Weight: 146 lb (66.2 kg) 145 lb (65.8 kg)    Telemetry    Atrial pacing - Personally Reviewed  Physical Exam  Sitting in a chair comfortably GEN: No acute distress.   Neck: No JVD Cardiac: RRR, no murmurs, rubs, or gallops.  Respiratory:crackles at the bases bilaterally. GI: Soft, nontender, non-distended  MS: Mild B/L edema L>R; No deformity. Neuro:  Nonfocal  Psych: Normal affect   Labs    Chemistry Recent Labs  Lab 05/23/17 2252 05/24/17 0616 05/25/17 0446  NA 142 142 141  K 3.0* 3.6 3.5  CL 97* 100* 98*  CO2 32 31 31  GLUCOSE 156* 111* 113*  BUN 14 13 9   CREATININE 0.96 0.89  0.81  CALCIUM 9.3 8.5* 8.9  PROT 7.1 5.4*  --   ALBUMIN 3.5 2.8*  --   AST 18 14*  --   ALT 13* 11*  --   ALKPHOS 51 40  --   BILITOT 1.3* 0.7  --   GFRNONAA 54* 60* >60  GFRAA >60 >60 >60  ANIONGAP 13 11 12      Hematology Recent Labs  Lab 05/24/17 0616 05/24/17 1630 05/25/17 0446 05/25/17 0831  WBC 9.8 8.0 8.5  --   RBC 2.55* 3.17* 2.98*  2.98*  --   HGB 8.4* 10.3* 9.8* 10.1*  HCT 26.2* 32.9* 31.0* 32.3*  MCV 102.7* 103.8* 104.0*  --   MCH 32.9 32.5 32.9  --   MCHC 32.1 31.3 31.6  --   RDW 13.4 13.4 13.8  --   PLT 174 146* 166  --     Cardiac Enzymes Recent Labs  Lab 05/24/17 0616 05/24/17 1652 05/25/17 0831 05/25/17 1341  TROPONINI <0.03 <0.03 <0.03 <0.03    Recent Labs  Lab 05/23/17 2256  TROPIPOC 0.01     BNP Recent Labs  Lab 05/23/17 2252 05/25/17 0831  BNP 225.7* 378.1*     DDimer  No results for input(s): DDIMER in the last 168 hours.   Radiology    Dg Chest 2 View  Result Date: 05/23/2017 CLINICAL DATA:  Shortness of breath. EXAM: CHEST - 2 VIEW COMPARISON:  05/07/2017 FINDINGS: Left-sided pacemaker in place. Post median sternotomy. Unchanged heart size and mediastinal contours. Decreasing linear right midlung opacity with mild residual, likely fluid in the fissure. There are small bilateral pleural effusions are new. Increased peribronchial thickening from prior exam. No confluent airspace disease. No pneumothorax. Bones are under mineralized. IMPRESSION: Small pleural effusions, new from prior. Increased peribronchial thickening from prior exam may be bronchitic change or pulmonary edema. Slight decrease fluid in the minor fissure. Suspect a degree of fluid overload. Electronically Signed   By: Rubye Oaks M.D.   On: 05/23/2017 23:28   Ct Angio Chest Pe W Or Wo Contrast  Result Date: 05/24/2017 CLINICAL DATA:  PE suspected, high pretest prob. Cough and shortness of breath. EXAM: CT ANGIOGRAPHY CHEST WITH CONTRAST TECHNIQUE: Multidetector  CT imaging of the chest was performed using the standard protocol during bolus administration of intravenous contrast. Multiplanar CT image reconstructions and MIPs were obtained to evaluate the vascular anatomy. CONTRAST:  59 cc ISOVUE-370 IOPAMIDOL (ISOVUE-370) INJECTION 76% COMPARISON:  Radiographs yesterday.  Chest CT 09/30/2015 FINDINGS: Cardiovascular: There are no filling defects within the pulmonary arteries to suggest pulmonary embolus. Multi chamber cardiomegaly. Aortic atherosclerosis without aneurysm or dissection. Stenosis at the origin of the left subclavian artery, as seen on prior exam. There are native coronary artery calcifications, post CABG. Pacemaker with leads in the right atrium and ventricle. Mediastinum/Nodes: Prominent right hilar node measures 12 mm short axis. Lower paratracheal node slightly decreased from prior measuring 18 mm, previously 21. Additional small mediastinal lymph nodes are similar to prior exam. Small hiatal hernia. No thyroid nodule. Lungs/Pleura: Mild emphysema. Mild septal thickening consistent with pulmonary edema. There is central bronchial thickening that may be congestive or bronchitic. Linear opacities in the right upper and middle lobes consistent with scarring. No significant pleural effusion. Dependent atelectasis in both lungs. Upper Abdomen: Stent in the upper abdominal aorta is partially included but grossly stable from 2017 CT. No acute findings. Musculoskeletal: Chronic T5 and T6 compression fractures. Post median sternotomy. No acute osseous abnormality. Review of the MIP images confirms the above findings. IMPRESSION: 1. No pulmonary embolus. 2. Smooth septal thickening consistent with pulmonary edema, bronchial thickening may be secondary to pulmonary edema or bronchitic change. 3. Cardiomegaly. Aortic atherosclerosis. Post CABG with calcification of the native coronary arteries. 4. Right middle and upper lobe scarring. Aortic Atherosclerosis  (ICD10-I70.0) and Emphysema (ICD10-J43.9). Electronically Signed   By: Rubye Oaks M.D.   On: 05/24/2017 03:11   Dg Chest Port 1 View  Result Date: 05/25/2017 CLINICAL DATA:  Hypoxemia. EXAM: PORTABLE CHEST 1 VIEW COMPARISON:  CT chest 05/24/2017 FINDINGS: The heart size is normal. Atherosclerotic calcifications are present the aortic arch. CABG is noted. Mild edema is again noted. Bibasilar atelectasis is evident. IMPRESSION: 1. Stable appearance of mild edema and bibasilar atelectasis. Electronically Signed   By: Marin Roberts M.D.   On: 05/25/2017 08:01    Cardiac Studies    Patient Profile     83 y.o. female   Assessment & Plan    1. Acute Hypoxic Respiratory Failure 2. Acute on chronic combined systolic and diastolic CHF 3. CAD 4. SSS 5. HTN  The patient with variable BP, BP meds held, now hypertensive, I would increase lasix to 40 mg iv  BID till tomorrow, then re-evaluate.  Restart home BP meds starting with losartan and metoprolol, add others as BP allows.  For questions or updates, please contact [facility] Please consult [email or web address] for contact info under Cardiology/STEMI.      Signed, Tobias Alexander, MD  05/25/2017, 2:39 PM

## 2017-05-25 NOTE — Telephone Encounter (Signed)
This is not a Dr. Delton SeeNelson pt.  Please send this message to the correct Cardiologist.  Thanks!

## 2017-05-25 NOTE — Progress Notes (Signed)
  Echocardiogram 2D Echocardiogram has been performed.  Belinda Werner, Belinda Werner F 05/25/2017, 3:51 PM

## 2017-05-25 NOTE — Progress Notes (Deleted)
Messaged IM Dr. Saunders RevelNedrud :  6E BP low unusually 84/68 HR 71, SpO2 94 on Venturi mask @ 50%, c/o chest tightness, wheeze auscutated.  advise for BP  Rt called and will come for PRN Tx.

## 2017-05-25 NOTE — Progress Notes (Addendum)
Messaged Dr. Rana SnareBodenheimer :  6E BP low unusually 84/68 HR 71, SpO2 94 on Venturi mask @ 50%, c/o chest tightness, wheeze auscutated.  advise for BP   MD ordered a 250ml NS bolus and to watch for MAP below 60

## 2017-05-26 ENCOUNTER — Other Ambulatory Visit: Payer: Self-pay

## 2017-05-26 ENCOUNTER — Encounter (HOSPITAL_COMMUNITY): Payer: Self-pay | Admitting: General Practice

## 2017-05-26 DIAGNOSIS — E785 Hyperlipidemia, unspecified: Secondary | ICD-10-CM

## 2017-05-26 LAB — BASIC METABOLIC PANEL
Anion gap: 12 (ref 5–15)
BUN: 16 mg/dL (ref 6–20)
CO2: 31 mmol/L (ref 22–32)
CREATININE: 0.86 mg/dL (ref 0.44–1.00)
Calcium: 9 mg/dL (ref 8.9–10.3)
Chloride: 96 mmol/L — ABNORMAL LOW (ref 101–111)
GFR calc non Af Amer: >60 mL/min (ref >60)
Glucose, Bld: 157 mg/dL — ABNORMAL HIGH (ref 65–99)
Potassium: 3.6 mmol/L (ref 3.5–5.1)
Sodium: 139 mmol/L (ref 135–145)

## 2017-05-26 MED ORDER — ENSURE ENLIVE PO LIQD
237.0000 mL | Freq: Two times a day (BID) | ORAL | Status: DC
  Administered 2017-05-26 – 2017-05-27 (×3): 237 mL via ORAL

## 2017-05-26 MED ORDER — POTASSIUM CHLORIDE CRYS ER 20 MEQ PO TBCR
40.0000 meq | EXTENDED_RELEASE_TABLET | Freq: Once | ORAL | Status: AC
  Administered 2017-05-26: 40 meq via ORAL
  Filled 2017-05-26: qty 2

## 2017-05-26 NOTE — Evaluation (Addendum)
Occupational Therapy Evaluation/Discharge Patient Details Name: Belinda SeltzerCarol Werner MRN: [id number] DOB: 15-Jul-1934 Today's Date: 05/26/2017    History of Present Illness Pt is an 83 y.o. female admitted 05/23/17 for acute hypoxic respiratory failure in setting of COPD and HF exacerbation. Of note, seen in urgent care two weeks ago with CAP. PMH includes PVD, HTN, CAD.    Clinical Impression   PTA, pt was independent with basic ADL participation and living with her son who assists with home management tasks when necessary. She uses a RW in the mornings when feeling unsteady but otherwise does not use assistive devices. Pt currently able to complete basic ADL in hospital setting at modified independent level. She is able to verbalize and demonstrate strategies to conserve energy throughout ADL and IADL participation and demonstrates good safety awareness. All education complete and pt demonstrates understanding of all topics. No further acute OT needs identified at this time and OT will sign off.     Follow Up Recommendations  No OT follow up;Supervision - Intermittent    Equipment Recommendations  None recommended by OT    Recommendations for Other Services       Precautions / Restrictions Precautions Precautions: Fall Precaution Comments: Watch SpO2 Restrictions Weight Bearing Restrictions: No      Mobility Bed Mobility               General bed mobility comments: OOB in recliner on my arrival.   Transfers Overall transfer level: Modified independent Equipment used: None Transfers: Sit to/from Stand Sit to Stand: Modified independent (Device/Increase time)              Balance Overall balance assessment: Mild deficits observed, not formally tested Sitting-balance support: No upper extremity supported;Feet supported Sitting balance-Leahy Scale: Good     Standing balance support: No upper extremity supported;During functional activity Standing balance-Leahy Scale:  Good                             ADL either performed or assessed with clinical judgement   ADL Overall ADL's : Modified independent;At baseline                                       General ADL Comments: Completing all ADL at modified independent level. Pt able to verbalize and demonstrate understanding of energy conservation strategies.      Vision Patient Visual Report: No change from baseline Vision Assessment?: No apparent visual deficits     Perception     Praxis      Pertinent Vitals/Pain Pain Assessment: No/denies pain     Hand Dominance     Extremity/Trunk Assessment Upper Extremity Assessment Upper Extremity Assessment: Overall WFL for tasks assessed   Lower Extremity Assessment Lower Extremity Assessment: Overall WFL for tasks assessed       Communication Communication Communication: No difficulties   Cognition Arousal/Alertness: Awake/alert Behavior During Therapy: WFL for tasks assessed/performed Overall Cognitive Status: Within Functional Limits for tasks assessed                                     General Comments  SpO2 92-93% during tasks when able to get accurate reading but limited ability to maintain accurate reading.     Exercises     Shoulder Instructions  Home Living Family/patient expects to be discharged to:: Private residence Living Arrangements: Children Available Help at Discharge: Family;Available 24 hours/day Type of Home: House Home Access: Stairs to enter [employer] of Steps: 1 threshold   Home Layout: One level     Bathroom Shower/Tub: Chief Strategy Officer: Standard     Home Equipment: Environmental consultant - 2 wheels;Bedside commode;Shower seat   Additional Comments: Uses BSC over toilet seat      Prior Functioning/Environment Level of Independence: Independent        Comments: Uses RW first thing in morning due to BLE stiffness/soreness, but does not  use majority of the day. Indep with community amb and ADLs        OT Problem List: Cardiopulmonary status limiting activity      OT Treatment/Interventions:      OT Goals(Current goals can be found in the care plan section) Acute Rehab OT Goals Patient Stated Goal: Return home OT Goal Formulation: With patient Time For Goal Achievement: 06/09/17 Potential to Achieve Goals: Good  OT Frequency:     Barriers to D/C:            Co-evaluation              AM-PAC PT "6 Clicks" Daily Activity     Outcome Measure Help from another person eating meals?: None Help from another person taking care of personal grooming?: None Help from another person toileting, which includes using toliet, bedpan, or urinal?: None Help from another person bathing (including washing, rinsing, drying)?: None Help from another person to put on and taking off regular upper body clothing?: None Help from another person to put on and taking off regular lower body clothing?: None 6 Click Score: 24   End of Session    Activity Tolerance: Patient tolerated treatment well Patient left: with call bell/phone within reach;in chair  OT Visit Diagnosis: Other abnormalities of gait and mobility (R26.89)                Time: 1040-1049 OT Time Calculation (min): 9 min Charges:  OT General Charges $OT Visit: 1 Visit OT Evaluation $OT Eval Low Complexity: 1 Low G-Codes:     Doristine Section, MS OTR/L  Pager: [phone]   Belinda Werner 05/26/2017, 1:08 PM

## 2017-05-26 NOTE — Progress Notes (Signed)
Physical Therapy Treatment Patient Details Name: Belinda Werner MRN: [id number] DOB: 08-30-1934 Today's Date: 05/26/2017    History of Present Illness Pt is an 83 y.o. female admitted 05/23/17 for acute hypoxic respiratory failure in setting of COPD and HF exacerbation. Of note, seen in urgent care two weeks ago with CAP. PMH includes PVD, HTN, CAD.     PT Comments    Pt mobilizing well. Amb on 4L of O2. Unable to get accurate SpO2 reading throughout so pt used dyspnea as her guide to stop and rest as she does at home. From PT standpoint pt can return home whenever medically ready.   Follow Up Recommendations  Supervision - Intermittent;No PT follow up     Equipment Recommendations  None recommended by PT    Recommendations for Other Services       Precautions / Restrictions Precautions Precautions: Fall Precaution Comments: Watch SpO2 Restrictions Weight Bearing Restrictions: No    Mobility  Bed Mobility               General bed mobility comments: Pt sitting in chair  Transfers Overall transfer level: Modified independent Equipment used: None Transfers: Sit to/from Stand Sit to Stand: Modified independent (Device/Increase time)            Ambulation/Gait Ambulation/Gait assistance: Supervision Ambulation Distance (Feet): 325 Feet Assistive device: None;Rolling walker (2 wheeled) Gait Pattern/deviations: Step-through pattern;Decreased stride length Gait velocity: Decreased   General Gait Details: Steady gait with RW. Pt amb on 4L of O2 with 2 standing rest breaks for dyspnea. Unable to obtain accurate SpO2 reading   Stairs             Wheelchair Mobility    Modified Rankin (Stroke Patients Only)       Balance Overall balance assessment: Needs assistance Sitting-balance support: No upper extremity supported;Feet supported Sitting balance-Leahy Scale: Good     Standing balance support: No upper extremity supported;During functional  activity Standing balance-Leahy Scale: Good                              Cognition Arousal/Alertness: Awake/alert Behavior During Therapy: WFL for tasks assessed/performed Overall Cognitive Status: Within Functional Limits for tasks assessed                                        Exercises      General Comments        Pertinent Vitals/Pain Pain Assessment: No/denies pain    Home Living Family/patient expects to be discharged to:: Private residence Living Arrangements: Children                  Prior Function            PT Goals (current goals can now be found in the care plan section) Progress towards PT goals: Progressing toward goals    Frequency    Min 3X/week      PT Plan Discharge plan needs to be updated    Co-evaluation              AM-PAC PT "6 Clicks" Daily Activity  Outcome Measure  Difficulty turning over in bed (including adjusting bedclothes, sheets and blankets)?: None Difficulty moving from lying on back to sitting on the side of the bed? : None Difficulty sitting down on and standing up from a chair with  arms (e.g., wheelchair, bedside commode, etc,.)?: None Help needed moving to and from a bed to chair (including a wheelchair)?: None Help needed walking in hospital room?: None Help needed climbing 3-5 steps with a railing? : A Little 6 Click Score: 23    End of Session Equipment Utilized During Treatment: Oxygen Activity Tolerance: Patient tolerated treatment well Patient left: in chair;with call bell/phone within reach Nurse Communication: Mobility status PT Visit Diagnosis: Other abnormalities of gait and mobility (R26.89)     Time: [id number] PT Time Calculation (min) (ACUTE ONLY): 17 min  Charges:  $Gait Training: 8-22 mins                    G Codes:       [facility] Ranbir Chew PT [phone]    Angelina OkCary W [facility] 05/26/2017, 9:26 AM

## 2017-05-26 NOTE — Progress Notes (Signed)
PROGRESS NOTE    Belinda Werner  WUJ:[id number] DOB: July 13, 1934 DOA: 05/23/2017 PCP: Belinda ChessmanAguiar, Rafaela M, MD   Brief Narrative:Belinda Werner is a 83 y.o. female with medical history significant of DM2, COPD on 3 L of oxygen baseline, sick sinus syndrome, status post dual-chamber pacemaker in 2017 history of CAD status post CABG,  cor pulmonale, tobacco abuse, pleural effusions, chronic respiratory failure with hypoxia. Presented with significant shortness of breath continues cough wheezing chest congestion. Reports sudden onset while sitting on the couch watching TV suddenly felt wrong, per family could not breathe.  2 weeks ago she was seen at urgent care diagnosed with CAP started on azithromycin and prednisone taper. Admitted with CHF exacerbation, worsening hypoxemia. She was improving. Overnight 4-14 became more hypoxic.   Assessment & Plan:   Active Problems:   Acute on chronic congestive heart failure with left ventricular diastolic dysfunction (HCC)   HLD (hyperlipidemia)   COPD (chronic obstructive pulmonary disease) (HCC)   Pleural effusion   HTN (hypertension)   Hypokalemia   Acute respiratory failure with hypoxia (HCC)   Respiratory failure (HCC)   Labile blood pressure  1-Acute hypoxic Respiratory Failure; in setting of COPD exacerbation and HF exacerbation.  Prior  weight 155. Weight today 144 CT angio; NO PE. Pulmonary edema, right middle lobe scarring.  Hb stable at 9.  Chest x ary; mild edema and basilar atelectasis.  Continue with  IV solumedrol Q 12 hours.  Appreciate cardiology assistance. Continue with IV lasix 40 mg IV BID>  Schedule nebulizer.  Today patient has more lung air movement, bilateral diffuse wheezing.   2-Hypotension; holder parameter for Imdur and metoprolol.  Repeat Hb.  Improved.   3-PVD; on plavix.   4-COPD exacerbation; continue with schedule nebulizer, IV solumedrol.  Improved today.   5-IBS; monitor.   6-Acute on chronic combine  systolic diastolic HF exacerbation;  Change lasix to IV daily.  Cardiology following.  BNP 378 ECHO normal EF, Diastolic dysfunction grade 2.   7-Anemia; of chronic diseases;  Hb last year around 10.  Hb stable 10---9. Suspect 13 on admission not accurate.   DVT prophylaxis: SCD Code Status: DNR Family Communication: care discussed with patient.  Disposition Plan: home when stable.   Consultants:   cardi logy    Procedures:   ECHO; pending 4/14 venous duplex L LE negative for DVT     Antimicrobials:  none  Subjective: She wants to go home. She is worry about bills. She is breathing better today.    Objective: Vitals:   05/26/17 0819 05/26/17 1106 05/26/17 1219 05/26/17 1437  BP:  (!) 131/49 (!) 129/44   Pulse: 80 82 80 81  Resp: 16  18 18   Temp:   98.7 F (37.1 C)   TempSrc:   Oral   SpO2: 92%  100% 99%  Weight:      Height:        Intake/Output Summary (Last 24 hours) at 05/26/2017 1440 Last data filed at 05/26/2017 1123 Gross per 24 hour  Intake 750 ml  Output -  Net 750 ml   Filed Weights   05/23/17 2233 05/25/17 0423 05/26/17 0522  Weight: 66.2 kg (146 lb) 65.8 kg (145 lb) 65.6 kg (144 lb 11.2 oz)    Examination:  General exam: NAD Respiratory system: normal respiratory effort, Bilateral air movement , bilateral wheezing.  Cardiovascular system: S 1, S 2 RRR Gastrointestinal system: BS present, soft, nt Central nervous system: non focal.  Extremities: symmetric power.  Skin: no rash     Data Reviewed: I have personally reviewed following labs and imaging studies  CBC: Recent Labs  Lab 05/23/17 2252 05/24/17 0616 05/24/17 1630 05/25/17 0446 05/25/17 0831  WBC 8.0 9.8 8.0 8.5  --   HGB 13.4 8.4* 10.3* 9.8* 10.1*  HCT 40.9 26.2* 32.9* 31.0* 32.3*  MCV 103.8* 102.7* 103.8* 104.0*  --   PLT 174 174 146* 166  --    Basic Metabolic Panel: Recent Labs  Lab 05/23/17 2252 05/24/17 0616 05/25/17 0446  NA 142 142 141  K 3.0* 3.6  3.5  CL 97* 100* 98*  CO2 32 31 31  GLUCOSE 156* 111* 113*  BUN 14 13 9   CREATININE 0.96 0.89 0.81  CALCIUM 9.3 8.5* 8.9  MG  --  2.4 2.4  PHOS  --  3.5  --    GFR: Estimated Creatinine Clearance: 48 mL/min (by C-G formula based on SCr of 0.81 mg/dL). Liver Function Tests: Recent Labs  Lab 05/23/17 2252 05/24/17 0616  AST 18 14*  ALT 13* 11*  ALKPHOS 51 40  BILITOT 1.3* 0.7  PROT 7.1 5.4*  ALBUMIN 3.5 2.8*   No results for input(s): LIPASE, AMYLASE in the last 168 hours. No results for input(s): AMMONIA in the last 168 hours. Coagulation Profile: Recent Labs  Lab 05/23/17 2252  INR 1.02   Cardiac Enzymes: Recent Labs  Lab 05/24/17 0616 05/24/17 1652 05/25/17 0831 05/25/17 1341 05/25/17 2038  TROPONINI <0.03 <0.03 <0.03 <0.03 <0.03   BNP (last 3 results) No results for input(s): PROBNP in the last 8760 hours. HbA1C: No results for input(s): HGBA1C in the last 72 hours. CBG: No results for input(s): GLUCAP in the last 168 hours. Lipid Profile: No results for input(s): CHOL, HDL, LDLCALC, TRIG, CHOLHDL, LDLDIRECT in the last 72 hours. Thyroid Function Tests: Recent Labs    05/24/17 0616  TSH 2.389   Anemia Panel: Recent Labs    05/25/17 0446  VITAMINB12 369  FOLATE 35.4  FERRITIN 141  TIBC 214*  IRON 64  RETICCTPCT 2.0   Sepsis Labs: No results for input(s): PROCALCITON, LATICACIDVEN in the last 168 hours.  No results found for this or any previous visit (from the past 240 hour(s)).       Radiology Studies: Dg Chest Port 1 View  Result Date: 05/25/2017 CLINICAL DATA:  Hypoxemia. EXAM: PORTABLE CHEST 1 VIEW COMPARISON:  CT chest 05/24/2017 FINDINGS: The heart size is normal. Atherosclerotic calcifications are present the aortic arch. CABG is noted. Mild edema is again noted. Bibasilar atelectasis is evident. IMPRESSION: 1. Stable appearance of mild edema and bibasilar atelectasis. Electronically Signed   By: Belinda Werner Werner.D.   On:  05/25/2017 08:01        Scheduled Meds: . aspirin EC  81 mg Oral Daily  . atorvastatin  80 mg Oral Daily  . clopidogrel  75 mg Oral Daily  . famotidine  20 mg Oral BID  . feeding supplement (ENSURE ENLIVE)  237 mL Oral BID BM  . furosemide  40 mg Intravenous BID  . gabapentin  300 mg Oral TID  . guaiFENesin  600 mg Oral BID  . ipratropium-albuterol  3 mL Nebulization TID  . isosorbide dinitrate  10 mg Oral BID  . methylPREDNISolone (SOLU-MEDROL) injection  60 mg Intravenous BID  . metoprolol tartrate  12.5 mg Oral BID  . mometasone-formoterol  2 puff Inhalation BID   Continuous Infusions:   LOS: 2 days    Time  spent: 35 minutes.     Alba Cory, MD [facility] Pager [phone]  If 7PM-7AM, please contact night-coverage [email or web address] Password [email or web address] 05/26/2017, 2:40 PM

## 2017-05-26 NOTE — Care Management Note (Signed)
Case Management Note  Patient Details  Name: Dalene SeltzerCarol Locatelli MRN: [id number] Date of Birth: 02/08/1935  Subjective/Objective:  Pt presented for Acute on Chronic Heart Failure. PTA from home and has support of son. Pt has DME 02- will follow to see if needs increase in liter flow.                   Action/Plan: No home needs identified at time of visit. MD asked CM to speak with pt in regards to Billing. CM did speak with patient and made pt aware of whom to call once bill received for patient assistance. No further needs from CM at this time.   Expected Discharge Date:                  Expected Discharge Plan:  Home/Self Care  In-House Referral:  NA  Discharge planning Services  CM Consult  Post Acute Care Choice:  NA Choice offered to:  NA  DME Arranged:  N/A DME Agency:  NA  HH Arranged:  NA HH Agency:  NA  Status of Service:  Completed, signed off  If discussed at Long Length of Stay Meetings, dates discussed:    Additional Comments:  Gala LewandowskyGraves-Bigelow, Lillan Mccreadie Kaye, RN 05/26/2017, 12:57 PM

## 2017-05-26 NOTE — Progress Notes (Signed)
Progress Note  Patient Name: Belinda Werner Date of Encounter: 05/26/2017  Primary Cardiologist: No primary care provider on file.   Subjective   She feels better, she was able to walk.   Inpatient Medications    Scheduled Meds: . aspirin EC  81 mg Oral Daily  . atorvastatin  80 mg Oral Daily  . clopidogrel  75 mg Oral Daily  . famotidine  20 mg Oral BID  . feeding supplement (ENSURE ENLIVE)  237 mL Oral BID BM  . furosemide  40 mg Intravenous BID  . gabapentin  300 mg Oral TID  . guaiFENesin  600 mg Oral BID  . ipratropium-albuterol  3 mL Nebulization TID  . isosorbide dinitrate  10 mg Oral BID  . methylPREDNISolone (SOLU-MEDROL) injection  60 mg Intravenous BID  . metoprolol tartrate  12.5 mg Oral BID  . mometasone-formoterol  2 puff Inhalation BID   Continuous Infusions:  PRN Meds: acetaminophen **OR** acetaminophen, albuterol, HYDROcodone-acetaminophen, ondansetron **OR** ondansetron (ZOFRAN) IV   Vital Signs    Vitals:   05/26/17 0500 05/26/17 0501 05/26/17 0522 05/26/17 0819  BP: (!) 121/41 (!) 121/41 (!) 151/49   Pulse: 75  73 80  Resp:  18 20 16   Temp:   98.9 F (37.2 C)   TempSrc:   Oral   SpO2: 98%  (!) 81% 92%  Weight:   144 lb 11.2 oz (65.6 kg)   Height:        Intake/Output Summary (Last 24 hours) at 05/26/2017 1059 Last data filed at 05/26/2017 0850 Gross per 24 hour  Intake 480 ml  Output -  Net 480 ml   Filed Weights   05/23/17 2233 05/25/17 0423 05/26/17 0522  Weight: 146 lb (66.2 kg) 145 lb (65.8 kg) 144 lb 11.2 oz (65.6 kg)    Telemetry    Atrial pacing - Personally Reviewed  Physical Exam  Sitting in a chair comfortably GEN: No acute distress.   Neck: No JVD Cardiac: RRR, no murmurs, rubs, or gallops.  Respiratory: Crackles at the bases bilaterally. Wheezing B/L. GI: Soft, nontender, non-distended  MS: Mild B/L edema L>R; No deformity. Neuro:  Nonfocal  Psych: Normal affect   Labs    Chemistry Recent Labs  Lab  05/23/17 2252 05/24/17 0616 05/25/17 0446  NA 142 142 141  K 3.0* 3.6 3.5  CL 97* 100* 98*  CO2 32 31 31  GLUCOSE 156* 111* 113*  BUN 14 13 9   CREATININE 0.96 0.89 0.81  CALCIUM 9.3 8.5* 8.9  PROT 7.1 5.4*  --   ALBUMIN 3.5 2.8*  --   AST 18 14*  --   ALT 13* 11*  --   ALKPHOS 51 40  --   BILITOT 1.3* 0.7  --   GFRNONAA 54* 60* >60  GFRAA >60 >60 >60  ANIONGAP 13 11 12      Hematology Recent Labs  Lab 05/24/17 0616 05/24/17 1630 05/25/17 0446 05/25/17 0831  WBC 9.8 8.0 8.5  --   RBC 2.55* 3.17* 2.98*  2.98*  --   HGB 8.4* 10.3* 9.8* 10.1*  HCT 26.2* 32.9* 31.0* 32.3*  MCV 102.7* 103.8* 104.0*  --   MCH 32.9 32.5 32.9  --   MCHC 32.1 31.3 31.6  --   RDW 13.4 13.4 13.8  --   PLT 174 146* 166  --    Cardiac Enzymes Recent Labs  Lab 05/24/17 1652 05/25/17 0831 05/25/17 1341 05/25/17 2038  TROPONINI <0.03 <0.03 <0.03 <0.03  Recent Labs  Lab 05/23/17 2256  TROPIPOC 0.01    BNP Recent Labs  Lab 05/23/17 2252 05/25/17 0831  BNP 225.7* 378.1*    DDimer No results for input(s): DDIMER in the last 168 hours.   Radiology    Dg Chest Port 1 View  Result Date: 05/25/2017 CLINICAL DATA:  Hypoxemia. EXAM: PORTABLE CHEST 1 VIEW COMPARISON:  CT chest 05/24/2017 FINDINGS: The heart size is normal. Atherosclerotic calcifications are present the aortic arch. CABG is noted. Mild edema is again noted. Bibasilar atelectasis is evident. IMPRESSION: 1. Stable appearance of mild edema and bibasilar atelectasis. Electronically Signed   By: Marin Robertshristopher  Mattern M.D.   On: 05/25/2017 08:01    Cardiac Studies    Patient Profile     83 y.o. female   Assessment & Plan    1. Acute Hypoxic Respiratory Failure 2. Acute on chronic combined systolic and diastolic CHF 3. CAD 4. SSS 5. HTN  She diuresed well overnight, but continues to have B/L basilar crackles and wheezing, I would continue the same dose of lasix 40 mg iv BID till tomorrow. COPD management per primary  team. Anticipated discharge tomorrow. I would switch to lasix 40 mg po BID and add spironolactone 12.5 mg po daily at the discharge.  For questions or updates, please contact [facility] Please consult [email or web address] for contact info under Cardiology/STEMI.      Signed, Tobias AlexanderKatarina Gerlean Cid, MD  05/26/2017, 10:59 AM

## 2017-05-27 DIAGNOSIS — J441 Chronic obstructive pulmonary disease with (acute) exacerbation: Secondary | ICD-10-CM

## 2017-05-27 DIAGNOSIS — J90 Pleural effusion, not elsewhere classified: Secondary | ICD-10-CM

## 2017-05-27 DIAGNOSIS — J9622 Acute and chronic respiratory failure with hypercapnia: Secondary | ICD-10-CM

## 2017-05-27 DIAGNOSIS — I272 Pulmonary hypertension, unspecified: Secondary | ICD-10-CM

## 2017-05-27 LAB — CBC
HCT: 30.9 % — ABNORMAL LOW (ref 36.0–46.0)
Hemoglobin: 9.9 g/dL — ABNORMAL LOW (ref 12.0–15.0)
MCH: 33.2 pg (ref 26.0–34.0)
MCHC: 32 g/dL (ref 30.0–36.0)
MCV: 103.7 fL — ABNORMAL HIGH (ref 78.0–100.0)
PLATELETS: 190 10*3/uL (ref 150–400)
RBC: 2.98 MIL/uL — ABNORMAL LOW (ref 3.87–5.11)
RDW: 13.4 % (ref 11.5–15.5)
WBC: 11.3 10*3/uL — ABNORMAL HIGH (ref 4.0–10.5)

## 2017-05-27 LAB — BASIC METABOLIC PANEL
Anion gap: 12 (ref 5–15)
BUN: 25 mg/dL — AB (ref 6–20)
CO2: 30 mmol/L (ref 22–32)
CREATININE: 0.89 mg/dL (ref 0.44–1.00)
Calcium: 8.9 mg/dL (ref 8.9–10.3)
Chloride: 96 mmol/L — ABNORMAL LOW (ref 101–111)
GFR calc Af Amer: >60 mL/min (ref >60)
GFR, EST NON AFRICAN AMERICAN: 60 mL/min — AB (ref >60)
GLUCOSE: 178 mg/dL — AB (ref 65–99)
Potassium: 4.6 mmol/L (ref 3.5–5.1)
SODIUM: 138 mmol/L (ref 135–145)

## 2017-05-27 MED ORDER — SPIRONOLACTONE 25 MG PO TABS: 12.5000 mg | ORAL_TABLET | Freq: Every day | ORAL | 0 refills | Status: DC

## 2017-05-27 MED ORDER — ENSURE ENLIVE PO LIQD: 237.0000 mL | Freq: Two times a day (BID) | ORAL | 12 refills | Status: DC

## 2017-05-27 MED ORDER — IPRATROPIUM-ALBUTEROL 20-100 MCG/ACT IN AERS: 1.0000 | INHALATION_SPRAY | Freq: Four times a day (QID) | RESPIRATORY_TRACT | 0 refills | Status: DC | PRN

## 2017-05-27 MED ORDER — PREDNISONE 10 MG PO TABS: ORAL_TABLET | ORAL | 0 refills | Status: DC

## 2017-05-27 MED ORDER — SPIRONOLACTONE 12.5 MG HALF TABLET
12.5000 mg | ORAL_TABLET | Freq: Every day | ORAL | Status: DC
  Administered 2017-05-27: 12.5 mg via ORAL
  Filled 2017-05-27: qty 1

## 2017-05-27 MED ORDER — METOPROLOL TARTRATE 25 MG PO TABS: 12.5000 mg | ORAL_TABLET | Freq: Two times a day (BID) | ORAL | 0 refills | Status: DC

## 2017-05-27 MED ORDER — FUROSEMIDE 40 MG PO TABS: 40.0000 mg | ORAL_TABLET | Freq: Two times a day (BID) | ORAL | Status: DC

## 2017-05-27 MED ORDER — TIOTROPIUM BROMIDE MONOHYDRATE 18 MCG IN CAPS: 18.0000 ug | ORAL_CAPSULE | Freq: Every day | RESPIRATORY_TRACT | 2 refills | Status: AC

## 2017-05-27 MED ORDER — FAMOTIDINE 20 MG PO TABS: 20.0000 mg | ORAL_TABLET | Freq: Two times a day (BID) | ORAL | 0 refills | Status: AC

## 2017-05-27 MED ORDER — ISOSORBIDE DINITRATE 10 MG PO TABS: 10.0000 mg | ORAL_TABLET | Freq: Two times a day (BID) | ORAL | 0 refills | Status: DC

## 2017-05-27 NOTE — Discharge Summary (Signed)
Physician Discharge Summary  Patient ID: Belinda Werner MRN: [id number] DOB/AGE: [birth year] 83 y.o.  Admit date: 05/23/2017 Discharge date: 05/27/2017  Admission Diagnoses:  Discharge Diagnoses:  Active Problems:   Acute on chronic congestive heart failure with left ventricular diastolic dysfunction (HCC)   HLD (hyperlipidemia)   COPD (chronic obstructive pulmonary disease) (HCC)   Pleural effusion   HTN (hypertension)   Hypokalemia   Acute respiratory failure with hypoxia (HCC)   Respiratory failure (HCC)   Labile blood pressure   Discharged Condition: stable  Hospital Course: Patient is an 83 year old Caucasian female with past medical history significant for diabetes mellitus type 2, COPD on home oxygen at 3 L/min via the nasal cannula, severe pulmonary hypertension, possible right ventricular failure, sick sinus syndrome, status post dual-chamber pacemaker in 2017, history of CAD status post CABG,cor pulmonale, tobacco abuse, pleural effusions, chronic respiratory failure with hypoxia.  Patient was admitted with worsening shortness of breath, cough, wheezing and chest congestion.  Patient was admitted and treated for acute on chronic diastolic congestive heart failure, acute on chronic respiratory failure and COPD with exacerbation.  Echocardiogram done revealed normal ejection fraction with grade 2 diastolic dysfunction and impaired RV function.  Patient has been optimized.  Patient insists on being discharged back on today.  Cardiology team assisted in directing patient's care.  A cardiology team is clear patient for discharge.  Patient will follow with the primary care provider and cardiology team on discharge.Marland Kitchen   1-Acute hypoxic Respiratory Failure; in setting of COPD exacerbation and HF exacerbation.  This is thought to be secondary to combined congestive heart failure and COPD exacerbation.  The patient was adequately diuresed.  The patient has lost at least 11 pounds during  the hospital stay.  Patient was also managed with steroids, neb treatments.  CTA of the chest was negative for pulmonary embolism.  Right middle lobe scarring was reported.  Pulmonary edema was also reported.  Respiratory failure has improved significantly.  Patient's supplemental oxygen requirement is back to baseline i.e. 3 L/min via the nasal cannula.  2-Hypotension: This was optimized during the hospital stay.  Kindly continue to monitor closely.  Patient has decreased right ventricular function and severe pulmonary hypertension.  Management of patient's blood pressure in this setting can be quite tenuous.  3-PVD: on plavix.   4-COPD exacerbation:  kindly see above.  Patient is back to baseline.     5-IBS: Stable.   6-Acute on chronic diastolic congestive heart failure: Continue Lasix and beta-blocker.  Monitor respiratory symptoms closely while on beta-blocker.  Echo finding is noted above.  Consults: cardiology  Significant Diagnostic Studies:  Echocardiogram: "- Left ventricle: The cavity size was normal. There was severe   focal basal and moderate concentric hypertrophy. Systolic   function was vigorous. The estimated ejection fraction was in the   range of 65% to 70%. There was increased turbulence in the LVOT   with dynamic obstruction at rest in the outflow tract, with a   peak gradient of 77 mm Hg. Wall motion was normal; there were no   regional wall motion abnormalities. Features are consistent with   a pseudonormal left ventricular filling pattern, with concomitant   abnormal relaxation and increased filling pressure (grade 2   diastolic dysfunction). Doppler parameters are consistent with   high ventricular filling pressure. - Mitral valve: Calcified annulus. There was mild to moderate   regurgitation. - Left atrium: The atrium was severely dilated. - Right ventricle: The cavity size was moderately  dilated. Wall   thickness was normal. Systolic function was mildly  reduced. - Right atrium: The atrium was moderately dilated. - Tricuspid valve: There was moderate regurgitation. - Pulmonary arteries: PA peak pressure: 73 mm Hg (S).  Impressions:  - The right ventricular systolic pressure was increased consistent   with severe pulmonary hypertension.   Discharge Exam: Blood pressure (!) 147/41, pulse 87, temperature 97.9 F (36.6 C), resp. rate 18, height  (1.549 m), weight 66.3 kg (146 lb 1.6 oz), SpO2 100 %.   Disposition: Discharge disposition: 01-Home or Self Care       Discharge Instructions    Call MD for:   Complete by:  As directed    Please call MD if the symptoms worsen   Diet - low sodium heart healthy   Complete by:  As directed    Increase activity slowly   Complete by:  As directed      Allergies as of 05/27/2017      Reactions   Avelox [moxifloxacin Hcl In Nacl] Hives, Other (See Comments)   hives   Penicillins Hives      Medication List    STOP taking these medications   amLODipine 5 MG tablet Commonly known as:  NORVASC   PROVENTIL HFA 108 (90 Base) MCG/ACT inhaler Generic drug:  albuterol     TAKE these medications   aspirin 81 MG tablet Take 81 mg by mouth daily.   atorvastatin 80 MG tablet Commonly known as:  LIPITOR Take 80 mg by mouth daily.   budesonide-formoterol 160-4.5 MCG/ACT inhaler Commonly known as:  SYMBICORT Inhale 2 puffs into the lungs 2 (two) times daily.   clopidogrel 75 MG tablet Commonly known as:  PLAVIX Take 75 mg by mouth daily.   famotidine 20 MG tablet Commonly known as:  PEPCID Take 1 tablet (20 mg total) by mouth 2 (two) times daily.   feeding supplement (ENSURE ENLIVE) Liqd Take 237 mLs by mouth 2 (two) times daily between meals.   furosemide 40 MG tablet Commonly known as:  LASIX Take 40 mg by mouth 2 (two) times daily.   gabapentin 100 MG capsule Commonly known as:  NEURONTIN Take 300 mg by mouth 3 (three) times daily.   Ipratropium-Albuterol  20-100 MCG/ACT Aers respimat Commonly known as:  COMBIVENT RESPIMAT Inhale 1 puff into the lungs every 6 (six) hours as needed for wheezing or shortness of breath.   isosorbide dinitrate 10 MG tablet Commonly known as:  ISORDIL Take 1 tablet (10 mg total) by mouth 2 (two) times daily. What changed:    medication strength  how much to take   losartan 50 MG tablet Commonly known as:  COZAAR Take 50 mg by mouth daily.   metoprolol tartrate 25 MG tablet Commonly known as:  LOPRESSOR Take 0.5 tablets (12.5 mg total) by mouth 2 (two) times daily. What changed:  how much to take   predniSONE 10 MG tablet Commonly known as:  DELTASONE Prednisone 60 mg p.o. once daily for 3 days, then 40 mg p.o. once daily for 3 days, then 30 mg p.o. once daily for 3 days, then 20 mg p.o. once daily for 3 days, then 10 mg p.o. once daily for 3 days then stop.   spironolactone 25 MG tablet Commonly known as:  ALDACTONE Take 0.5 tablets (12.5 mg total) by mouth daily. Start taking on:  05/28/2017   tiotropium 18 MCG inhalation capsule Commonly known as:  SPIRIVA HANDIHALER Place 1 capsule (18 mcg  total) into inhaler and inhale daily.        SignedBarnetta Chapel 05/27/2017, 12:54 PM

## 2017-05-27 NOTE — Progress Notes (Signed)
The patient has been given discharge instructions along with a new medication list and what to take today. She has follow up appointments and prescriptions to pick; one including paper copy for prednisone. She is discharge with her son via car.   Sheppard Evensina Gurpreet Mikhail RN

## 2017-05-27 NOTE — Progress Notes (Signed)
Progress Note  Patient Name: Belinda Werner Date of Encounter: 05/27/2017  Primary Cardiologist: No primary care provider on file.   Subjective   She feels significantly better, she was able to walk.   Inpatient Medications    Scheduled Meds: . aspirin EC  81 mg Oral Daily  . atorvastatin  80 mg Oral Daily  . clopidogrel  75 mg Oral Daily  . famotidine  20 mg Oral BID  . feeding supplement (ENSURE ENLIVE)  237 mL Oral BID BM  . furosemide  40 mg Intravenous BID  . gabapentin  300 mg Oral TID  . guaiFENesin  600 mg Oral BID  . ipratropium-albuterol  3 mL Nebulization TID  . isosorbide dinitrate  10 mg Oral BID  . methylPREDNISolone (SOLU-MEDROL) injection  60 mg Intravenous BID  . metoprolol tartrate  12.5 mg Oral BID  . mometasone-formoterol  2 puff Inhalation BID   Continuous Infusions:  PRN Meds: acetaminophen **OR** acetaminophen, albuterol, HYDROcodone-acetaminophen, ondansetron **OR** ondansetron (ZOFRAN) IV   Vital Signs    Vitals:   05/27/17 0602 05/27/17 0638 05/27/17 0731 05/27/17 0811  BP: (!) 131/46   (!) 147/41  Pulse: 76  82 87  Resp: 16  18   Temp: 97.9 F (36.6 C)     TempSrc:      SpO2: 95%  100%   Weight:  146 lb 1.6 oz (66.3 kg)    Height:        Intake/Output Summary (Last 24 hours) at 05/27/2017 1043 Last data filed at 05/27/2017 0900 Gross per 24 hour  Intake 1350 ml  Output 800 ml  Net 550 ml   Filed Weights   05/25/17 0423 05/26/17 0522 05/27/17 [id number]  Weight: 145 lb (65.8 kg) 144 lb 11.2 oz (65.6 kg) 146 lb 1.6 oz (66.3 kg)    Telemetry    Atrial pacing - Personally Reviewed  Physical Exam  Sitting in a chair comfortably GEN: No acute distress.   Neck: No JVD Cardiac: RRR, no murmurs, rubs, or gallops.  Respiratory: Crackles at the bases bilaterally. Wheezing B/L. GI: Soft, nontender, non-distended  MS: Mild B/L edema L>R; No deformity. Neuro:  Nonfocal  Psych: Normal affect   Labs    Chemistry Recent Labs  Lab  05/23/17 2252 05/24/17 0616 05/25/17 0446 05/26/17 1457 05/27/17 0337  NA 142 142 141 139 138  K 3.0* 3.6 3.5 3.6 4.6  CL 97* 100* 98* 96* 96*  CO2 32 31 31 31 30   GLUCOSE 156* 111* 113* 157* 178*  BUN 14 13 9 16  25*  CREATININE 0.96 0.89 0.81 0.86 0.89  CALCIUM 9.3 8.5* 8.9 9.0 8.9  PROT 7.1 5.4*  --   --   --   ALBUMIN 3.5 2.8*  --   --   --   AST 18 14*  --   --   --   ALT 13* 11*  --   --   --   ALKPHOS 51 40  --   --   --   BILITOT 1.3* 0.7  --   --   --   GFRNONAA 54* 60* >60 >60 60*  GFRAA >60 >60 >60 >60 >60  ANIONGAP 13 11 12 12 12      Hematology Recent Labs  Lab 05/24/17 1630 05/25/17 0446 05/25/17 0831 05/27/17 0337  WBC 8.0 8.5  --  11.3*  RBC 3.17* 2.98*  2.98*  --  2.98*  HGB 10.3* 9.8* 10.1* 9.9*  HCT 32.9* 31.0* 32.3*  30.9*  MCV 103.8* 104.0*  --  103.7*  MCH 32.5 32.9  --  33.2  MCHC 31.3 31.6  --  32.0  RDW 13.4 13.8  --  13.4  PLT 146* 166  --  190   Cardiac Enzymes Recent Labs  Lab 05/24/17 1652 05/25/17 0831 05/25/17 1341 05/25/17 2038  TROPONINI <0.03 <0.03 <0.03 <0.03    Recent Labs  Lab 05/23/17 2256  TROPIPOC 0.01    BNP Recent Labs  Lab 05/23/17 2252 05/25/17 0831  BNP 225.7* 378.1*    DDimer No results for input(s): DDIMER in the last 168 hours.   Radiology    No results found.  Cardiac Studies    Patient Profile     83 y.o. female   Assessment & Plan    1. Acute Hypoxic Respiratory Failure 2. Acute on chronic combined systolic and diastolic CHF 3. CAD 4. SSS 5. HTN  She diuresed well overnight, crackles have almost resolved, as well as wheezing, she is ready to be discharged. I would switch to lasix 40 mg po BID and add spironolactone 12.5 mg po daily at the discharge.  For questions or updates, please contact [facility] Please consult [email or web address] for contact info under Cardiology/STEMI.      Signed, Tobias Alexander, MD  05/27/2017, 10:43 AM

## 2017-05-27 NOTE — Progress Notes (Signed)
Nutrition Brief Note  Patient identified on the Malnutrition Screening Tool (MST) Report.  Patient reports good intake now and PTA. She has had no significant weight loss.   Nutrition focused physical exam completed.  No muscle or subcutaneous fat depletion noticed.   Wt Readings from Last 15 Encounters:  05/27/17 146 lb 1.6 oz (66.3 kg)  11/06/16 154 lb (69.9 kg)  09/30/15 158 lb (71.7 kg)  07/16/15 161 lb (73 kg)  04/30/15 159 lb (72.1 kg)  10/09/14 162 lb 4 oz (73.6 kg)  09/26/14 157 lb (71.2 kg)  09/19/14 157 lb (71.2 kg)  04/24/14 160 lb (72.6 kg)  02/08/14 164 lb (74.4 kg)  10/14/13 163 lb 9.6 oz (74.2 kg)  07/11/12 175 lb 3.2 oz (79.5 kg)  01/02/12 174 lb (78.9 kg)    Body mass index is 27.61 kg/m. Patient meets criteria for overweight based on current BMI.   Current diet order is 2 gm sodium, patient is consuming approximately 100% of meals at this time. Labs and medications reviewed.   Patient reports that she follows a low sodium diet at home. She has no questions regarding low sodium diet.  No nutrition interventions warranted at this time. If nutrition issues arise, please consult RD.   Joaquin CourtsKimberly Harris, RD, LDN, CNSC Pager [phone] After Hours Pager [phone]

## 2017-07-03 ENCOUNTER — Encounter: Payer: Self-pay | Admitting: Family

## 2017-07-03 ENCOUNTER — Ambulatory Visit (HOSPITAL_COMMUNITY)
Admission: RE | Admit: 2017-07-03 | Discharge: 2017-07-03 | Disposition: A | Discharge status: Home / Self Care | Payer: Medicare Other | Source: Ambulatory Visit | Attending: Family | Admitting: Family

## 2017-07-03 ENCOUNTER — Ambulatory Visit (INDEPENDENT_AMBULATORY_CARE_PROVIDER_SITE_OTHER): Payer: Medicare Other | Admitting: Family

## 2017-07-03 ENCOUNTER — Other Ambulatory Visit: Payer: Self-pay

## 2017-07-03 VITALS — BP 77/50 | HR 84 | Temp 97.9°F | Resp 22 | Ht 61.0 in | Wt 140.0 lb

## 2017-07-03 DIAGNOSIS — F17200 Nicotine dependence, unspecified, uncomplicated: Secondary | ICD-10-CM | POA: Insufficient documentation

## 2017-07-03 DIAGNOSIS — Z95828 Presence of other vascular implants and grafts: Secondary | ICD-10-CM | POA: Insufficient documentation

## 2017-07-03 DIAGNOSIS — I6523 Occlusion and stenosis of bilateral carotid arteries: Secondary | ICD-10-CM | POA: Insufficient documentation

## 2017-07-03 DIAGNOSIS — Z9889 Other specified postprocedural states: Secondary | ICD-10-CM

## 2017-07-03 DIAGNOSIS — Z959 Presence of cardiac and vascular implant and graft, unspecified: Secondary | ICD-10-CM

## 2017-07-03 DIAGNOSIS — I779 Disorder of arteries and arterioles, unspecified: Secondary | ICD-10-CM

## 2017-07-03 DIAGNOSIS — R0902 Hypoxemia: Secondary | ICD-10-CM | POA: Diagnosis not present

## 2017-07-03 NOTE — Patient Instructions (Addendum)
Steps to Quit Smoking Smoking tobacco can be bad for your health. It can also affect almost every organ in your body. Smoking puts you and people around you at risk for many serious long-lasting (chronic) diseases. Quitting smoking is hard, but it is one of the best things that you can do for your health. It is never too late to quit. What are the benefits of quitting smoking? When you quit smoking, you lower your risk for getting serious diseases and conditions. They can include:  Lung cancer or lung disease.  Heart disease.  Stroke.  Heart attack.  Not being able to have children (infertility).  Weak bones (osteoporosis) and broken bones (fractures).  If you have coughing, wheezing, and shortness of breath, those symptoms may get better when you quit. You may also get sick less often. If you are pregnant, quitting smoking can help to lower your chances of having a baby of low birth weight. What can I do to help me quit smoking? Talk with your doctor about what can help you quit smoking. Some things you can do (strategies) include:  Quitting smoking totally, instead of slowly cutting back how much you smoke over a period of time.  Going to in-person counseling. You are more likely to quit if you go to many counseling sessions.  Using resources and support systems, such as: ? Online chats with a counselor. ? Phone quitlines. ? Printed self-help materials. ? Support groups or group counseling. ? Text messaging programs. ? Mobile phone apps or applications.  Taking medicines. Some of these medicines may have nicotine in them. If you are pregnant or breastfeeding, do not take any medicines to quit smoking unless your doctor says it is okay. Talk with your doctor about counseling or other things that can help you.  Talk with your doctor about using more than one strategy at the same time, such as taking medicines while you are also going to in-person counseling. This can help make  quitting easier. What things can I do to make it easier to quit? Quitting smoking might feel very hard at first, but there is a lot that you can do to make it easier. Take these steps:  Talk to your family and friends. Ask them to support and encourage you.  Call phone quitlines, reach out to support groups, or work with a counselor.  Ask people who smoke to not smoke around you.  Avoid places that make you want (trigger) to smoke, such as: ? Bars. ? Parties. ? Smoke-break areas at work.  Spend time with people who do not smoke.  Lower the stress in your life. Stress can make you want to smoke. Try these things to help your stress: ? Getting regular exercise. ? Deep-breathing exercises. ? Yoga. ? Meditating. ? Doing a body scan. To do this, close your eyes, focus on one area of your body at a time from head to toe, and notice which parts of your body are tense. Try to relax the muscles in those areas.  Download or buy apps on your mobile phone or tablet that can help you stick to your quit plan. There are many free apps, such as QuitGuide from the CDC (Centers for Disease Control and Prevention). You can find more support from smokefree.gov and other websites.  This information is not intended to replace advice given to you by your health care provider. Make sure you discuss any questions you have with your health care provider. Document Released: 11/23/2008 Document   Revised: 09/25/2015 Document Reviewed: 06/13/2014 Elsevier Interactive Patient Education  2018 [employer]   Stroke Prevention Some health problems and behaviors may make it more likely for you to have a stroke. Below are ways to lessen your risk of having a stroke.  Be active for at least 30 minutes on most or all days.  Do not smoke. Try not to be around others who smoke.  Do not drink too much alcohol. ? Do not have more than 2 drinks a day if you are a man. ? Do not have more than 1 drink a day if you are a  woman and are not pregnant.  Eat healthy foods, such as fruits and vegetables. If you were put on a specific diet, follow the diet as told.  Keep your cholesterol levels under control through diet and medicines. Look for foods that are low in saturated fat, trans fat, cholesterol, and are high in fiber.  If you have diabetes, follow all diet plans and take your medicine as told.  Ask your doctor if you need treatment to lower your blood pressure. If you have high blood pressure (hypertension), follow all diet plans and take your medicine as told by your doctor.  If you are 18-39 years old, have your blood pressure checked every 3-5 years. If you are age 40 or older, have your blood pressure checked every year.  Keep a healthy weight. Eat foods that are low in calories, salt, saturated fat, trans fat, and cholesterol.  Do not take drugs.  Avoid birth control pills, if this applies. Talk to your doctor about the risks of taking birth control pills.  Talk to your doctor if you have sleep problems (sleep apnea).  Take all medicine as told by your doctor. ? You may be told to take aspirin or blood thinner medicine. Take this medicine as told by your doctor. ? Understand your medicine instructions.  Make sure any other conditions you have are being taken care of.  Get help right away if:  You suddenly lose feeling (you feel numb) or have weakness in your face, arm, or leg.  Your face or eyelid hangs down to one side.  You suddenly feel confused.  You have trouble talking (aphasia) or understanding what people are saying.  You suddenly have trouble seeing in one or both eyes.  You suddenly have trouble walking.  You are dizzy.  You lose your balance or your movements are clumsy (uncoordinated).  You suddenly have a very bad headache and you do not know the cause.  You have new chest pain.  Your heart feels like it is fluttering or skipping a beat (irregular heartbeat). Do  not wait to see if the symptoms above go away. Get help right away. Call your local emergency services (911 in [place] Do not drive yourself to the hospital. This information is not intended to replace advice given to you by your health care provider. Make sure you discuss any questions you have with your health care provider. Document Released: 07/29/2011 Document Revised: 07/05/2015 Document Reviewed: 07/30/2012 Elsevier Interactive Patient Education  2018 [employer] Inc.     Peripheral Vascular Disease Peripheral vascular disease (PVD) is a disease of the blood vessels that are not part of your heart and brain. A simple term for PVD is poor circulation. In most cases, PVD narrows the blood vessels that carry blood from your heart to the rest of your body. This can result in a decreased supply of blood to   your arms, legs, and internal organs, like your stomach or kidneys. However, it most often affects a person's lower legs and feet. There are two types of PVD.  Organic PVD. This is the more common type. It is caused by damage to the structure of blood vessels.  Functional PVD. This is caused by conditions that make blood vessels contract and tighten (spasm).  Without treatment, PVD tends to get worse over time. PVD can also lead to acute ischemic limb. This is when an arm or limb suddenly has trouble getting enough blood. This is a medical emergency. Follow these instructions at home:  Take medicines only as told by your doctor.  Do not use any tobacco products, including cigarettes, chewing tobacco, or electronic cigarettes. If you need help quitting, ask your doctor.  Lose weight if you are overweight, and maintain a healthy weight as told by your doctor.  Eat a diet that is low in fat and cholesterol. If you need help, ask your doctor.  Exercise regularly. Ask your doctor for some good activities for you.  Take good care of your feet. ? Wear comfortable shoes that fit  well. ? Check your feet often for any cuts or sores. Contact a doctor if:  You have cramps in your legs while walking.  You have leg pain when you are at rest.  You have coldness in a leg or foot.  Your skin changes.  You are unable to get or have an erection (erectile dysfunction).  You have cuts or sores on your feet that are not healing. Get help right away if:  Your arm or leg turns cold and blue.  Your arms or legs become red, warm, swollen, painful, or numb.  You have chest pain or trouble breathing.  You suddenly have weakness in your face, arm, or leg.  You become very confused or you cannot speak.  You suddenly have a very bad headache.  You suddenly cannot see. This information is not intended to replace advice given to you by your health care provider. Make sure you discuss any questions you have with your health care provider. Document Released: 04/23/2009 Document Revised: 07/05/2015 Document Reviewed: 07/07/2013 Elsevier Interactive Patient Education  2017 [employer] Inc.  

## 2017-07-03 NOTE — Progress Notes (Signed)
[facility] [place] HISTORY AND PHYSICAL   CC: Follow up extracranial carotid artery stenosis and peripheral artery occlusive disease    History of Present Illness:   Belinda Werner is a 83 y.o. female who is s/p angiography on 09/26/2014 where she had stenting of her left common and external iliac artery with a 8 x 60 Cordis self-expanding stent by Dr. Myra Gianotti. Preintervention the stenosis was 80%.Preintervention she had bumped her left leg a month prior having developed a sore with blisters.   She has an extensive vascular history, mostly done at [facility]. She is status post left carotid stenting in 2009. She had a right carotid endarterectomy in 2003. She has had iliac stenting performed as well with repeat dilatation. She has a history of coronary artery disease and has had multiple percutaneous stents. She is long term smoker, has severe COPD, uses supplemental oxygen.  She denies any known history of stroke or TIA.   She admits not walking much, but with walking from her chair to the restroom, about 15 feet, and her legs feel heavy doing this.   She had an MI, then CABG about 2004.  She was admitted to [facility] in April 2019 for 4 days with exacerbation CHF.   She denies any worse dyspnea than usual, states today she could not get her SaO2 above 71% at home. She states her SaO2 usually runs about 92%. She remains on 3 L O2/[place].  She saw Dr. Su Monks, her pulmonologist, last week, states her Sao2 was better last week. Recheck of Sao2 is 71-84% on 3L O2/[place].   Pt Diabetic: No Pt smoker: former smoker, last cig was 06-28-17 (1/2 ppd, over 50 yrs)  Pt meds include: Statin :Yes Betablocker: Yes ASA: Yes Other anticoagulants/antiplatelets: Plavix    Current Outpatient Medications  Medication Sig Dispense Refill  . aspirin 81 MG tablet Take 81 mg by mouth daily.    Marland Kitchen atorvastatin (LIPITOR) 80 MG tablet Take 80 mg by mouth daily.      .  budesonide-formoterol (SYMBICORT) 160-4.5 MCG/ACT inhaler Inhale 2 puffs into the lungs 2 (two) times daily.    . clopidogrel (PLAVIX) 75 MG tablet Take 75 mg by mouth daily.      . famotidine (PEPCID) 20 MG tablet Take 1 tablet (20 mg total) by mouth 2 (two) times daily. 60 tablet 0  . feeding supplement, ENSURE ENLIVE, (ENSURE ENLIVE) LIQD Take 237 mLs by mouth 2 (two) times daily between meals. 237 mL 12  . furosemide (LASIX) 40 MG tablet Take 40 mg by mouth 2 (two) times daily.    Marland Kitchen gabapentin (NEURONTIN) 100 MG capsule Take 300 mg by mouth 3 (three) times daily.     . Ipratropium-Albuterol (COMBIVENT RESPIMAT) 20-100 MCG/ACT AERS respimat Inhale 1 puff into the lungs every 6 (six) hours as needed for wheezing or shortness of breath. 1 Inhaler 0  . isosorbide dinitrate (ISORDIL) 10 MG tablet Take 1 tablet (10 mg total) by mouth 2 (two) times daily. 60 tablet 0  . losartan (COZAAR) 50 MG tablet Take 50 mg by mouth daily.    . metoprolol tartrate (LOPRESSOR) 25 MG tablet Take 0.5 tablets (12.5 mg total) by mouth 2 (two) times daily. 60 tablet 0  . predniSONE (DELTASONE) 10 MG tablet Prednisone 60 mg p.o. once daily for 3 days, then 40 mg p.o. once daily for 3 days, then 30 mg p.o. once daily for 3 days, then 20 mg p.o. once daily for 3 days,  then 10 mg p.o. once daily for 3 days then stop. 48 tablet 0  . spironolactone (ALDACTONE) 25 MG tablet Take 0.5 tablets (12.5 mg total) by mouth daily. 30 tablet 0  . tiotropium (SPIRIVA HANDIHALER) 18 MCG inhalation capsule Place 1 capsule (18 mcg total) into inhaler and inhale daily. 30 capsule 2   No current facility-administered medications for this visit.     Past Medical History:  Diagnosis Date  . Acute on chronic congestive heart failure (HCC)   . CAD (coronary artery disease)    a. s/p CABG in 2004 with patent grafts by cath in 07/2015  . COPD (chronic obstructive pulmonary disease) (HCC)   . Diastolic dysfunction    echo 09/2008 EF 55%, mild  to mod LVH, LA mild to mod dilated  . Dyspnea   . Hypercholesterolemia   . Hypertension   . MI (myocardial infarction) (HCC)    history  . Pneumonia   . PVD (peripheral vascular disease) (HCC)    refused amputation in the past     Social History Social History   Tobacco Use  . Smoking status: Current Every Day Smoker    Packs/day: 0.50    Years: 50.00    Pack years: 25.00    Types: Cigarettes  . Smokeless tobacco: Never Used  . Tobacco comment: Has not had a cigarette in 4 days as of 07/03/17.  Substance Use Topics  . Alcohol use: No    Alcohol/week: 0.0 oz  . Drug use: No    Family History Family History  Problem Relation Age of Onset  . Diabetes Mother   . Varicose Veins Mother   . Cancer Father   . Diabetes Daughter   . Heart disease Daughter   . Hypertension Daughter   . Heart attack Daughter        before age 32  . Heart disease Son   . Hypertension Son   . Heart attack Son        before age 78  . Cancer Sister     Surgical History Past Surgical History:  Procedure Laterality Date  . ABDOMINAL HYSTERECTOMY    . APPENDECTOMY    . CHOLECYSTECTOMY    . CORONARY ARTERY BYPASS GRAFT    . OTHER SURGICAL HISTORY     ?heart valve replacement  . PERIPHERAL VASCULAR CATHETERIZATION N/A 09/26/2014   Procedure: Abdominal Aortogram;  Surgeon: Nada Libman, MD;  Location: [facility] INVASIVE CV LAB;  Service: Cardiovascular;  Laterality: N/A;  . TONSILLECTOMY      Allergies  Allergen Reactions  . Avelox [Moxifloxacin Hcl In Nacl] Hives and Other (See Comments)    hives  . Penicillins Hives    Current Outpatient Medications  Medication Sig Dispense Refill  . aspirin 81 MG tablet Take 81 mg by mouth daily.    Marland Kitchen atorvastatin (LIPITOR) 80 MG tablet Take 80 mg by mouth daily.      . budesonide-formoterol (SYMBICORT) 160-4.5 MCG/ACT inhaler Inhale 2 puffs into the lungs 2 (two) times daily.    . clopidogrel (PLAVIX) 75 MG tablet Take 75 mg by mouth daily.      .  famotidine (PEPCID) 20 MG tablet Take 1 tablet (20 mg total) by mouth 2 (two) times daily. 60 tablet 0  . feeding supplement, ENSURE ENLIVE, (ENSURE ENLIVE) LIQD Take 237 mLs by mouth 2 (two) times daily between meals. 237 mL 12  . furosemide (LASIX) 40 MG tablet Take 40 mg by mouth 2 (two) times daily.    Marland Kitchen  gabapentin (NEURONTIN) 100 MG capsule Take 300 mg by mouth 3 (three) times daily.     . Ipratropium-Albuterol (COMBIVENT RESPIMAT) 20-100 MCG/ACT AERS respimat Inhale 1 puff into the lungs every 6 (six) hours as needed for wheezing or shortness of breath. 1 Inhaler 0  . isosorbide dinitrate (ISORDIL) 10 MG tablet Take 1 tablet (10 mg total) by mouth 2 (two) times daily. 60 tablet 0  . losartan (COZAAR) 50 MG tablet Take 50 mg by mouth daily.    . metoprolol tartrate (LOPRESSOR) 25 MG tablet Take 0.5 tablets (12.5 mg total) by mouth 2 (two) times daily. 60 tablet 0  . predniSONE (DELTASONE) 10 MG tablet Prednisone 60 mg p.o. once daily for 3 days, then 40 mg p.o. once daily for 3 days, then 30 mg p.o. once daily for 3 days, then 20 mg p.o. once daily for 3 days, then 10 mg p.o. once daily for 3 days then stop. 48 tablet 0  . spironolactone (ALDACTONE) 25 MG tablet Take 0.5 tablets (12.5 mg total) by mouth daily. 30 tablet 0  . tiotropium (SPIRIVA HANDIHALER) 18 MCG inhalation capsule Place 1 capsule (18 mcg total) into inhaler and inhale daily. 30 capsule 2   No current facility-administered medications for this visit.      REVIEW OF SYSTEMS: See HPI for pertinent positives and negatives.  Physical Examination Vitals:   07/03/17 1456 07/03/17 1459  BP: (!) 93/47 (!) 77/50  Pulse: 84   Resp: (!) 22   Temp: 97.9 F (36.6 C)   TempSrc: Oral   SpO2: (!) 78%   Weight: 140 lb (63.5 kg)   Height:  (1.549 m)    Body mass index is 26.45 kg/m.  General:  Chronically ill appearing female in NAD Gait: slow, deliberate HENT: WNL Eyes: Pupils equal Pulmonary: mildly labored breathing  at rest, limited air movement in all fields, no rales or rhonchi, faint wheezes in anterior fields. Supplemental oxygen 3 L by nasal canula/portable tank in place. Cardiac: RRR, + murmur Abdomen: softl, NT, no masses palpated  Skin: no rashes, no ulcers, no cellulitis. Multiple ecchymotic areas on arms and legs. See Extremities  VASCULAR EXAM  Carotid Bruits Right Left   Positive Positive    Abdominal aortic pulse is not palpable Radial pulses are 1+ palpable and =   VASCULAR EXAM: Extremitieswith ischemic changes, without Gangrene; no open wounds: lesions have healed at medial aspect left lower leg. Both feet are moderately dusky, dry flaky skin.     LE Pulses Right Left   FEMORAL faintly palpable 2+ palpable    POPLITEAL not palpable  not palpable   POSTERIOR TIBIAL not palpable  not palpable   DORSALIS PEDIS  ANTERIOR TIBIAL not palpable  faintly palpable     Musculoskeletal: no muscle wasting or atrophy; no peripheral edema. Mild to moderate kyphosis.  Neurologic:A&O X 3; appropriate affect; SENSATION: equal in both feet to touch, MOTOR FUNCTION: 5/5 Symmetric in upper extremities, 4/5 in lower extremities, CN 2-12 intact, adequate dorsi and plantar flexion, speech is fluent/normal. Psychiatric: Normal thought content, mood appropriate to clinical situation.     ASSESSMENT:  Belinda Werner is a 83 y.o. female who is s/p angiography on 09/26/2014 where she had stenting of her left common and external iliac artery with a 8 x 60 Cordis self-expanding stent. Preintervention the stenosis was 80%.Preintervention she had bumped her left leg a month prior having developed a sore with blisters.   She has an extensive vascular history,  mostly done at [facility].  She is status post left carotid stenting in 2009. She had a right carotid endarterectomy in 2003. She has had iliac stenting performed as well with repeat dilatation. When I spoke with Dr. Darrick Penna on pt 11-06-16 visit, he advised no need to recheck iliac artery stent duplex.   Pt declined blood pressures taken in her ankles today. Right toe pressure is 0. Left toe pressure is 24.  Feet and lower legs are dusky, no open wounds, no ulcers.   I referred her to podiatry at her September 2018 visit to trim thick long toenails.   She has a history of coronary artery disease and has had multiple percutaneous stents. She is long term smoker, has severe COPD, uses supplemental oxygen. Despite requiring supplemental oxygen, she continued to smoke until 06-28-17, was her last cigarette to date.   I discussed with Dr. Randie Heinz pt SaO2 of 71-84% on 3L O2/[place], and increase in velocities in right CCA and left ICA, pt remains asymptomatic of neurological events; return in 6 months with carotid duplex and ABI's, see Dr. Myra Gianotti. Pt is in fragile health.  I spoke with Dr. Su Monks, her pulmonologist; pt was admitted to [facility] a month ago for exacerbation of CHF.  He covers [facility], not [facility]. He knows this pt well. After some discussion including that she feels she is no more dyspneic than usual, he said it could be left up to the pt discretion when and if she needs to be seen in the ED for dyspnea, at whichever hospital is most convenient for her. I discussed this with pt and son.   DATA  Carotid Duplex (07/03/17): Right ICA:  CEA site with1-39% stenosis Right CCA: 518 cm/s PSV Right ECA: >50% stenosis Left ICA: CEA site with 60-70% stenosis (just distal to stent); compared to 1-39% stenosis on 11-06-16. Bilateral vertebral artery flow is antegrade.  Bilateral subclavian artery waveforms are normal.  Significant increase in stenosis of the left ICA, just distal to the stent compared to the exam on  11-06-16. Significant stenosis of the distal right CCA (518 cm/s PSV), compared to 433 cm/s PSV on 11-06-16.    ABI (Date: 07/03/2017):  R:   Pt declined having blood pressures taken in ankles  Toe pressure: 0.00  L:   Pt declined having blood pressures taken in ankles  Toe pressure: 24 mm Hg   ABI (Date: 11/06/2016):  R:  ? ABI: pt declined b/p cuff due to pain in legs(was 0.54 on 11-19-15),  ? PT: mono ? DP: mono ? TBI:  Not documented  L:  ? ABI:  (was 0.47),  ? PT: mono ? DP: mono ? TBI: not documented   Bilateral Iliac Artery Stent Duplex (11/06/16): Limited visualization of the abdominal vasculature due to overlying bowel gas and patient body habitus.  Highest velocity is at the right proximal stent, 301 cm/s. No significant change compared to the exam on 11-19-15.    PLAN:   I discussed with Dr. Randie Heinz pt SaO2 of 71-84% on 3L O2/[place], and increase in velocities in right CCA and left ICA, pt remains asymptomatic of neurological events; return in 6 months with carotid duplex and ABI's, see Dr. Myra Gianotti. Pt is in fragile health.  I spoke with Dr. Su Monks, her pulmonologist; pt was admitted to [facility] a month ago for exacerbation of CHF.  He covers [facility], not [facility]. He knows this pt well. After some discussion including that she feels she is  no more dyspneic than usual, he said it could be left up to the pt discretion when and if she needs to be seen in the ED for dyspnea, at whichever hospital is most convenient for her. I discussed this with pt and son.    I discussed in depth with the patient the nature of atherosclerosis, and emphasized the importance of maximal medical management including strict control of blood pressure, blood glucose, and lipid levels, obtaining regular exercise, and cessation of smoking.  The patient is aware that without maximal medical management the underlying atherosclerotic disease process will progress, limiting the benefit of any  interventions.  The patient was given information about stroke prevention and what symptoms should prompt the patient to seek immediate medical care.  The patient was given information about PAD including signs, symptoms, treatment, what symptoms should prompt the patient to seek immediate medical care, and risk reduction measures to take.  Thank you for allowing [place] to participate in this patient's care.  Charisse March, RN, MSN, FNP-C [facility] Office: [phone]  Clinic MD: Randie Heinz 07/03/2017 3:06 PM

## 2017-07-24 ENCOUNTER — Other Ambulatory Visit: Payer: Self-pay

## 2017-07-24 DIAGNOSIS — I6523 Occlusion and stenosis of bilateral carotid arteries: Secondary | ICD-10-CM

## 2017-07-24 DIAGNOSIS — I779 Disorder of arteries and arterioles, unspecified: Secondary | ICD-10-CM

## 2017-10-19 ENCOUNTER — Other Ambulatory Visit: Payer: Self-pay

## 2017-10-19 ENCOUNTER — Inpatient Hospital Stay (HOSPITAL_COMMUNITY)
Admission: EM | Admit: 2017-10-19 | Discharge: 2017-11-10 | DRG: 871 | Disposition: E | Payer: Medicare Other | Attending: Family Medicine | Admitting: Family Medicine

## 2017-10-19 ENCOUNTER — Emergency Department (HOSPITAL_COMMUNITY): Payer: Medicare Other

## 2017-10-19 ENCOUNTER — Encounter (HOSPITAL_COMMUNITY): Payer: Self-pay

## 2017-10-19 DIAGNOSIS — Z95820 Peripheral vascular angioplasty status with implants and grafts: Secondary | ICD-10-CM

## 2017-10-19 DIAGNOSIS — Z9981 Dependence on supplemental oxygen: Secondary | ICD-10-CM

## 2017-10-19 DIAGNOSIS — I480 Paroxysmal atrial fibrillation: Secondary | ICD-10-CM | POA: Diagnosis present

## 2017-10-19 DIAGNOSIS — K5790 Diverticulosis of intestine, part unspecified, without perforation or abscess without bleeding: Secondary | ICD-10-CM | POA: Diagnosis present

## 2017-10-19 DIAGNOSIS — Z66 Do not resuscitate: Secondary | ICD-10-CM | POA: Diagnosis present

## 2017-10-19 DIAGNOSIS — Z7982 Long term (current) use of aspirin: Secondary | ICD-10-CM

## 2017-10-19 DIAGNOSIS — I252 Old myocardial infarction: Secondary | ICD-10-CM

## 2017-10-19 DIAGNOSIS — I10 Essential (primary) hypertension: Secondary | ICD-10-CM | POA: Diagnosis not present

## 2017-10-19 DIAGNOSIS — Z955 Presence of coronary angioplasty implant and graft: Secondary | ICD-10-CM | POA: Diagnosis not present

## 2017-10-19 DIAGNOSIS — F17210 Nicotine dependence, cigarettes, uncomplicated: Secondary | ICD-10-CM | POA: Diagnosis present

## 2017-10-19 DIAGNOSIS — E876 Hypokalemia: Secondary | ICD-10-CM | POA: Diagnosis present

## 2017-10-19 DIAGNOSIS — I5042 Chronic combined systolic (congestive) and diastolic (congestive) heart failure: Secondary | ICD-10-CM | POA: Diagnosis not present

## 2017-10-19 DIAGNOSIS — I2510 Atherosclerotic heart disease of native coronary artery without angina pectoris: Secondary | ICD-10-CM | POA: Diagnosis present

## 2017-10-19 DIAGNOSIS — Z7951 Long term (current) use of inhaled steroids: Secondary | ICD-10-CM | POA: Diagnosis not present

## 2017-10-19 DIAGNOSIS — J440 Chronic obstructive pulmonary disease with acute lower respiratory infection: Secondary | ICD-10-CM | POA: Diagnosis present

## 2017-10-19 DIAGNOSIS — I739 Peripheral vascular disease, unspecified: Secondary | ICD-10-CM | POA: Diagnosis present

## 2017-10-19 DIAGNOSIS — K56609 Unspecified intestinal obstruction, unspecified as to partial versus complete obstruction: Secondary | ICD-10-CM | POA: Diagnosis present

## 2017-10-19 DIAGNOSIS — E7800 Pure hypercholesterolemia, unspecified: Secondary | ICD-10-CM | POA: Diagnosis present

## 2017-10-19 DIAGNOSIS — N179 Acute kidney failure, unspecified: Secondary | ICD-10-CM

## 2017-10-19 DIAGNOSIS — E860 Dehydration: Secondary | ICD-10-CM | POA: Diagnosis present

## 2017-10-19 DIAGNOSIS — J181 Lobar pneumonia, unspecified organism: Secondary | ICD-10-CM | POA: Diagnosis present

## 2017-10-19 DIAGNOSIS — K5909 Other constipation: Secondary | ICD-10-CM | POA: Diagnosis present

## 2017-10-19 DIAGNOSIS — Z8249 Family history of ischemic heart disease and other diseases of the circulatory system: Secondary | ICD-10-CM

## 2017-10-19 DIAGNOSIS — Z7902 Long term (current) use of antithrombotics/antiplatelets: Secondary | ICD-10-CM

## 2017-10-19 DIAGNOSIS — E785 Hyperlipidemia, unspecified: Secondary | ICD-10-CM | POA: Diagnosis present

## 2017-10-19 DIAGNOSIS — Z809 Family history of malignant neoplasm, unspecified: Secondary | ICD-10-CM

## 2017-10-19 DIAGNOSIS — Y95 Nosocomial condition: Secondary | ICD-10-CM | POA: Diagnosis present

## 2017-10-19 DIAGNOSIS — Z880 Allergy status to penicillin: Secondary | ICD-10-CM

## 2017-10-19 DIAGNOSIS — Z90710 Acquired absence of both cervix and uterus: Secondary | ICD-10-CM

## 2017-10-19 DIAGNOSIS — I5032 Chronic diastolic (congestive) heart failure: Secondary | ICD-10-CM | POA: Diagnosis present

## 2017-10-19 DIAGNOSIS — I878 Other specified disorders of veins: Secondary | ICD-10-CM | POA: Diagnosis present

## 2017-10-19 DIAGNOSIS — J9621 Acute and chronic respiratory failure with hypoxia: Secondary | ICD-10-CM | POA: Diagnosis present

## 2017-10-19 DIAGNOSIS — A419 Sepsis, unspecified organism: Principal | ICD-10-CM | POA: Diagnosis present

## 2017-10-19 DIAGNOSIS — Z9049 Acquired absence of other specified parts of digestive tract: Secondary | ICD-10-CM

## 2017-10-19 DIAGNOSIS — R10814 Left lower quadrant abdominal tenderness: Secondary | ICD-10-CM | POA: Diagnosis present

## 2017-10-19 DIAGNOSIS — Z716 Tobacco abuse counseling: Secondary | ICD-10-CM | POA: Diagnosis not present

## 2017-10-19 DIAGNOSIS — J9811 Atelectasis: Secondary | ICD-10-CM | POA: Diagnosis present

## 2017-10-19 DIAGNOSIS — Z881 Allergy status to other antibiotic agents status: Secondary | ICD-10-CM

## 2017-10-19 DIAGNOSIS — J189 Pneumonia, unspecified organism: Secondary | ICD-10-CM

## 2017-10-19 DIAGNOSIS — D638 Anemia in other chronic diseases classified elsewhere: Secondary | ICD-10-CM | POA: Diagnosis present

## 2017-10-19 DIAGNOSIS — I495 Sick sinus syndrome: Secondary | ICD-10-CM | POA: Diagnosis present

## 2017-10-19 DIAGNOSIS — Z833 Family history of diabetes mellitus: Secondary | ICD-10-CM

## 2017-10-19 DIAGNOSIS — K5900 Constipation, unspecified: Secondary | ICD-10-CM

## 2017-10-19 DIAGNOSIS — Z79899 Other long term (current) drug therapy: Secondary | ICD-10-CM | POA: Diagnosis not present

## 2017-10-19 DIAGNOSIS — I110 Hypertensive heart disease with heart failure: Secondary | ICD-10-CM | POA: Diagnosis present

## 2017-10-19 DIAGNOSIS — Z950 Presence of cardiac pacemaker: Secondary | ICD-10-CM

## 2017-10-19 DIAGNOSIS — Z951 Presence of aortocoronary bypass graft: Secondary | ICD-10-CM

## 2017-10-19 LAB — COMPREHENSIVE METABOLIC PANEL
ALK PHOS: 66 U/L (ref 38–126)
ALT: 10 U/L (ref 0–44)
ANION GAP: 16 — AB (ref 5–15)
AST: 22 U/L (ref 15–41)
Albumin: 2.8 g/dL — ABNORMAL LOW (ref 3.5–5.0)
BILIRUBIN TOTAL: 1.7 mg/dL — AB (ref 0.3–1.2)
BUN: 17 mg/dL (ref 8–23)
CALCIUM: 8.7 mg/dL — AB (ref 8.9–10.3)
CO2: 30 mmol/L (ref 22–32)
Chloride: 96 mmol/L — ABNORMAL LOW (ref 98–111)
Creatinine, Ser: 1.54 mg/dL — ABNORMAL HIGH (ref 0.44–1.00)
GFR calc non Af Amer: 30 mL/min — ABNORMAL LOW (ref >60)
GFR, EST AFRICAN AMERICAN: 35 mL/min — AB (ref >60)
Glucose, Bld: 143 mg/dL — ABNORMAL HIGH (ref 70–99)
POTASSIUM: 2.8 mmol/L — AB (ref 3.5–5.1)
Sodium: 142 mmol/L (ref 135–145)
TOTAL PROTEIN: 6.2 g/dL — AB (ref 6.5–8.1)

## 2017-10-19 LAB — I-STAT TROPONIN, ED
TROPONIN I, POC: 0.07 ng/mL (ref 0.00–0.08)
Troponin i, poc: 0.04 ng/mL (ref 0.00–0.08)

## 2017-10-19 LAB — CBC WITH DIFFERENTIAL/PLATELET
BASOS PCT: 1 %
Basophils Absolute: 0.1 10*3/uL (ref 0.0–0.1)
EOS ABS: 0.1 10*3/uL (ref 0.0–0.7)
Eosinophils Relative: 1 %
HEMATOCRIT: 31.8 % — AB (ref 36.0–46.0)
HEMOGLOBIN: 9.7 g/dL — AB (ref 12.0–15.0)
LYMPHS ABS: 0.7 10*3/uL (ref 0.7–4.0)
Lymphocytes Relative: 6 %
MCH: 29.8 pg (ref 26.0–34.0)
MCHC: 30.5 g/dL (ref 30.0–36.0)
MCV: 97.5 fL (ref 78.0–100.0)
Monocytes Absolute: 0.4 10*3/uL (ref 0.1–1.0)
Monocytes Relative: 4 %
NEUTROS ABS: 9.7 10*3/uL — AB (ref 1.7–7.7)
Neutrophils Relative %: 88 %
Platelets: 274 10*3/uL (ref 150–400)
RBC: 3.26 MIL/uL — ABNORMAL LOW (ref 3.87–5.11)
RDW: 14.6 % (ref 11.5–15.5)
WBC Morphology: INCREASED
WBC: 11 10*3/uL — ABNORMAL HIGH (ref 4.0–10.5)

## 2017-10-19 LAB — I-STAT CG4 LACTIC ACID, ED
LACTIC ACID, VENOUS: 3.65 mmol/L — AB (ref 0.5–1.9)
Lactic Acid, Venous: 1 mmol/L (ref 0.5–1.9)

## 2017-10-19 LAB — BRAIN NATRIURETIC PEPTIDE: B Natriuretic Peptide: 376.2 pg/mL — ABNORMAL HIGH (ref 0.0–100.0)

## 2017-10-19 LAB — LIPASE, BLOOD: LIPASE: 25 U/L (ref 11–51)

## 2017-10-19 MED ORDER — FAMOTIDINE 20 MG PO TABS
20.0000 mg | ORAL_TABLET | Freq: Two times a day (BID) | ORAL | Status: DC
  Administered 2017-10-20 – 2017-10-21 (×3): 20 mg via ORAL
  Filled 2017-10-19 (×3): qty 1

## 2017-10-19 MED ORDER — FLECAINIDE ACETATE 50 MG PO TABS
100.0000 mg | ORAL_TABLET | Freq: Two times a day (BID) | ORAL | Status: DC
  Administered 2017-10-20 – 2017-10-21 (×4): 100 mg via ORAL
  Filled 2017-10-19 (×5): qty 2

## 2017-10-19 MED ORDER — POTASSIUM CHLORIDE IN NACL 40-0.9 MEQ/L-% IV SOLN
INTRAVENOUS | Status: DC
  Administered 2017-10-19: 75 mL/h via INTRAVENOUS
  Filled 2017-10-19: qty 1000

## 2017-10-19 MED ORDER — DILTIAZEM HCL ER COATED BEADS 240 MG PO CP24
240.0000 mg | ORAL_CAPSULE | Freq: Every day | ORAL | Status: DC
  Administered 2017-10-20 – 2017-10-21 (×2): 240 mg via ORAL
  Filled 2017-10-19 (×3): qty 1

## 2017-10-19 MED ORDER — ENOXAPARIN SODIUM 40 MG/0.4ML ~~LOC~~ SOLN
40.0000 mg | SUBCUTANEOUS | Status: DC
  Administered 2017-10-19: 40 mg via SUBCUTANEOUS
  Filled 2017-10-19: qty 0.4

## 2017-10-19 MED ORDER — ACETAMINOPHEN 650 MG RE SUPP: 650.0000 mg | Freq: Four times a day (QID) | RECTAL | Status: DC | PRN

## 2017-10-19 MED ORDER — IPRATROPIUM-ALBUTEROL 0.5-2.5 (3) MG/3ML IN SOLN
3.0000 mL | RESPIRATORY_TRACT | Status: DC
  Administered 2017-10-19 – 2017-10-20 (×3): 3 mL via RESPIRATORY_TRACT
  Filled 2017-10-19 (×3): qty 3

## 2017-10-19 MED ORDER — VANCOMYCIN HCL 500 MG IV SOLR
500.0000 mg | INTRAVENOUS | Status: DC
  Administered 2017-10-20: 500 mg via INTRAVENOUS
  Filled 2017-10-19: qty 500

## 2017-10-19 MED ORDER — POLYETHYLENE GLYCOL 3350 17 G PO PACK: 17.0000 g | PACK | Freq: Every day | ORAL | Status: DC

## 2017-10-19 MED ORDER — SODIUM CHLORIDE 0.9 % IV SOLN
2.0000 g | Freq: Once | INTRAVENOUS | Status: AC
  Administered 2017-10-19: 2 g via INTRAVENOUS
  Filled 2017-10-19: qty 2

## 2017-10-19 MED ORDER — NICOTINE 14 MG/24HR TD PT24
14.0000 mg | MEDICATED_PATCH | Freq: Every day | TRANSDERMAL | Status: DC
  Administered 2017-10-20 – 2017-10-21 (×2): 14 mg via TRANSDERMAL
  Filled 2017-10-19 (×3): qty 1

## 2017-10-19 MED ORDER — ISOSORBIDE MONONITRATE ER 30 MG PO TB24
30.0000 mg | ORAL_TABLET | Freq: Two times a day (BID) | ORAL | Status: DC
  Administered 2017-10-20 – 2017-10-21 (×3): 30 mg via ORAL
  Filled 2017-10-19 (×3): qty 1

## 2017-10-19 MED ORDER — VANCOMYCIN HCL 10 G IV SOLR
1250.0000 mg | Freq: Once | INTRAVENOUS | Status: AC
  Administered 2017-10-19: 1250 mg via INTRAVENOUS
  Filled 2017-10-19: qty 1250

## 2017-10-19 MED ORDER — ISOSORBIDE DINITRATE 10 MG PO TABS
10.0000 mg | ORAL_TABLET | Freq: Two times a day (BID) | ORAL | Status: DC
  Administered 2017-10-20: 10 mg via ORAL
  Filled 2017-10-19: qty 1

## 2017-10-19 MED ORDER — ACETAMINOPHEN 325 MG PO TABS: 650.0000 mg | ORAL_TABLET | Freq: Four times a day (QID) | ORAL | Status: DC | PRN

## 2017-10-19 MED ORDER — POTASSIUM CHLORIDE CRYS ER 20 MEQ PO TBCR
40.0000 meq | EXTENDED_RELEASE_TABLET | Freq: Once | ORAL | Status: AC
  Administered 2017-10-19: 40 meq via ORAL
  Filled 2017-10-19: qty 2

## 2017-10-19 MED ORDER — PANTOPRAZOLE SODIUM 40 MG PO TBEC
80.0000 mg | DELAYED_RELEASE_TABLET | Freq: Every day | ORAL | Status: DC
  Administered 2017-10-20: 80 mg via ORAL
  Filled 2017-10-19 (×2): qty 2

## 2017-10-19 MED ORDER — ASPIRIN 81 MG PO CHEW
81.0000 mg | CHEWABLE_TABLET | Freq: Every day | ORAL | Status: DC
  Administered 2017-10-20 – 2017-10-21 (×2): 81 mg via ORAL
  Filled 2017-10-19 (×3): qty 1

## 2017-10-19 MED ORDER — SODIUM CHLORIDE 0.9 % IV BOLUS
500.0000 mL | Freq: Once | INTRAVENOUS | Status: AC
  Administered 2017-10-19: 500 mL via INTRAVENOUS

## 2017-10-19 MED ORDER — ATORVASTATIN CALCIUM 80 MG PO TABS
80.0000 mg | ORAL_TABLET | Freq: Every day | ORAL | Status: DC
  Administered 2017-10-20 – 2017-10-21 (×2): 80 mg via ORAL
  Filled 2017-10-19 (×3): qty 1

## 2017-10-19 MED ORDER — PREDNISONE 20 MG PO TABS
50.0000 mg | ORAL_TABLET | Freq: Every day | ORAL | Status: DC
  Administered 2017-10-20 – 2017-10-21 (×2): 50 mg via ORAL
  Filled 2017-10-19 (×3): qty 2

## 2017-10-19 MED ORDER — METOPROLOL SUCCINATE ER 50 MG PO TB24
50.0000 mg | ORAL_TABLET | Freq: Every day | ORAL | Status: DC
  Administered 2017-10-20 – 2017-10-21 (×2): 50 mg via ORAL
  Filled 2017-10-19 (×3): qty 1

## 2017-10-19 MED ORDER — METOPROLOL TARTRATE 12.5 MG HALF TABLET: 12.5000 mg | ORAL_TABLET | Freq: Two times a day (BID) | ORAL | Status: DC

## 2017-10-19 MED ORDER — CLOPIDOGREL BISULFATE 75 MG PO TABS
75.0000 mg | ORAL_TABLET | Freq: Every day | ORAL | Status: DC
  Administered 2017-10-20: 75 mg via ORAL
  Filled 2017-10-19: qty 1

## 2017-10-19 MED ORDER — SPIRONOLACTONE 12.5 MG HALF TABLET
12.5000 mg | ORAL_TABLET | Freq: Every day | ORAL | Status: DC
  Administered 2017-10-20: 12.5 mg via ORAL
  Filled 2017-10-19: qty 1

## 2017-10-19 MED ORDER — POTASSIUM CHLORIDE 10 MEQ/100ML IV SOLN
10.0000 meq | INTRAVENOUS | Status: DC
  Administered 2017-10-19: 10 meq via INTRAVENOUS
  Filled 2017-10-19 (×2): qty 100

## 2017-10-19 NOTE — Progress Notes (Signed)
Pharmacy Antibiotic Note  Belinda Werner is a 83 y.o. female admitted on 2017-10-28 with pneumonia.  Pharmacy has been consulted for vancomycin dosing. WBC 11.0, Lac 3.65, Afebrile, sCr 1.54, CrCl ~76ml/min.  Plan: Vancomycin 1250mg  IV x1, then 500mg  Q24H Aztreonam 2g x1 F/u LOT/de-escalation, cultures, renal function, troughs prn   Height: 5\' 1"  (154.9 cm) Weight: 126 lb (57.2 kg) IBW/kg (Calculated) : 47.8  Temp (24hrs), Avg:98.4 F (36.9 C), Min:98.4 F (36.9 C), Max:98.4 F (36.9 C)  Recent Labs  Lab Oct 28, 2017 1820 2017-10-28 1827  WBC 11.0*  --   CREATININE 1.54*  --   LATICACIDVEN  --  3.65*    Estimated Creatinine Clearance: 21.6 mL/min (A) (by C-G formula based on SCr of 1.54 mg/dL (H)).    Allergies  Allergen Reactions  . Avelox [Moxifloxacin Hcl In Nacl] Hives and Other (See Comments)    hives  . Penicillins Hives    Antimicrobials this admission: Vanc 9/9 >>  Aztreonam 9/9  >>   Dose adjustments this admission:   Microbiology results: 9/9 BCx:   Thank you for allowing pharmacy to be a part of this patient's care.  Ewing Schlein, PharmD PGY1 Pharmacy Resident 28-Oct-2017    8:23 PM

## 2017-10-19 NOTE — ED Triage Notes (Signed)
Pt arrives from home, c/o by son who stated pt couldn't breathe, per [facility] initally refused treatment was developing cyanosis, RR in 40's inability to answer questions and satting in the 80's on initial nebulizer treatment.  Pt is Aox4 on arrival.  PTA received 2 douneb treatments, 125 solumedrol, magnesium sulfate, sats up to 99% with second nebulizer finishing upon arrival.  Temp for [facility] 104, HR 70-80's, BP 116/34

## 2017-10-19 NOTE — H&P (Addendum)
Family Medicine Teaching [facility] Admission History and Physical Service Pager: [phone]  Patient name: Belinda Werner Medical record number: [id number] Date of birth: 1934/05/01 Age: 83 y.o. Gender: female  Primary Care Provider: Angelica Chessman, MD Consultants: None  Code Status: DNR  Chief Complaint: SOB, fever and increase work of breathing  Assessment and Plan: Belinda Werner is a 83 y.o. female presenting with shortness of breath, increase work of breathing, fever and found to have a pneumonia on CXR. PMH is significant for COPD, HFpEF, hypokalemia, hypertension, CAD s/p stent placement, PAD, hyperlipidemia   Acute on chronic hypoxic respiratory failure, likely 2/2 to COPD exacerbation in the setting of a RML pneumonia: Family reports a few days history of increasing dyspnea with no increase in oxygen requirement also associated with fatigue. Most consistent with COPD exacerbation precipitated by pneumonia, given pt was noted to be febrile to 104F by EMS, CXR showing airspace disease at RML and lung base suspicious for HAP, and requiring oxygen above her baseline. Patient also meeting sepsis criteria with tachypnea to mid 20's, WBC 11 (with left shift), fever prior to arrival, and additional lactic acid of 3.65, however reassured that she is afebrile currently. Concern for healthcare associated pneumonia, given recent hospital discharge on 8/19. Considered acute on chronic HFpEF, however pt appears euvolemic on exam and without any suggestive history concerning for this. BNP 376, at her baseline. Low concern for ACS contributing to SOB given troponin negative x1, EKG without ischemic changes, and without chest pain.  -Admit to FMTS, admitting attending Dr. Jennette Kettle  --Admit to telemetry floor -S/p 125 mg solu-medrol via EMS; start prednisone 50 mg daily in the am  -Continue duonebs scheduled q4  -Albuterol PRN q2  -Start Vancomycin and Aztreonam (d/t penicillin allergies)  -Start  patient on NS +KCL 75 ml/hr  -Trend lactic acid - Follow up CBC and BMP -F/u blood cultures -Vitals per routine  -Wean oxygen as tolerated; goal 88-92% spO2 -Incentive spirometer   Acute kidney injury: Cr 1.54 on admit, baseline around 0.8. Likely prerenal secondary to decreased PO and fluid intake (albumin 2.8) reported by family, also in the setting of lasix and spironolactone BID therapy.  -75 ml/hr NS mIVF x 10 hours  -Monitor BMP  -Avoid nephrotoxic medications    Hypokalemia: K 2.8 on admit. Likely multifactorial in the setting of poor po intake and frequent chronic lasix use. Patient also have evidence of bowel obstruction, hypokalemia also contributing. Will replete with goal >4.0 however will need to be careful given patient current AKI. -S/p 10 meq IV + 40 meq PO in the ED  -40 meq in mIVF   -Monitor BMP   COPD: On 3L at home chronically, takes symbicort, combivent, and spiriva at home and endorses compliance. Currently on 6L [place]. Increase O2 requirement this admission most likely due to  pneumonia. Follows with pulmonary, Dr. Su Monks, and recently saw him 8/28, no new changes made to her medical therapy. Continued tobacco abuse.   -Duonebs q 4 scheduled  -Albuterol q 2 PRN   -Hold home symbicort, combivent, and Spiriva, resume when clinically appropriate --Continue prednisone 50 mg daily -Discuss smoking cessation as below    Constipation, chronic, worsening: Pt reports no bowel movement in the past two weeks, has not tried anything at home. Baseline 1-2 BM every 2 weeks. Pt denies abdominal pain, N/V. However tender to palpation of LLQ and RLQ on exam, with hypoactive BS. Consideration for decreased PO intake and dehydration contributing vs concern for  obstruction. Pt had colonoscopy performed 8/7 with three small sessile polyps removed and diverticulosis, no masses. No weight loss or other symptoms concerning for malignancy as a possible etiology for obstruction. -Obtain KUB   -NPO -mIVF as above  --Consider oral motility GI motility agents vs enema   Peripheral Vascular disease, chronic, stable Stented left common and external iliac artery on 09/2014. Patient had 80% stenosis noted. Patient is followed by vascular. --Continue plavix 75 mg daily  Normocytic anemia: Stable. Hgb 9.7 on admit, Baseline 9-10. Likely secondary to chronic disease burden. Ferritin 141 in 05/2017.  -Monitor CBC    Bradycardia s/p pacemaker placement: Stable, HR 70's on admit. Pt reports no issues with her pacemaker. Patient has had a recent admission which showed paroxysmal atrial tachycardia. EKG unchanged from prior. -Interrogate pacemaker  -Monitor HR  --Continue diltiazem 240 mg daily, flecainide 100 mg bid, Metoprolol succinate  50 mg daily  Paroxysmal atrial tachycardia: Resolved, HR stable in 70's. Noted during previous hospitalization at [place] a month ago, started on Flecainide 100 mg and diltiazem 240 mg per cardiology.  -Cont home medications as above   HFpEF: Echo 05/2017 with EF 65-70% and G2DD. Appears euvolemic on exam. Takes lasix 40mg  BID and spironolactone 25 mg daily at home for control.  -Hold home medications given AKI and fluid resuscitation; consideration for restarting during hospitalization  -Cont home metoprolol XR 50mg  -Cont home atorvastatin 80mg   -Cont home Losartan 50mg    Hypertension: Normotensive on admit, 120's systolic. Takes Losartan, metoprolol, lasix, and spironolactone at home for control.  -Cont home metoprolol  -Hold home losartan, lasix, spironolactone in setting of AKI   Hyperlipidemia: Last LDL 44 in 06/2012.  -F/u on lipid panel  -Cont home atorvastatin   CAD s/p CABG: In 2004, patent grafts noted during cardiac cath in 2017. No anginal symptoms at this time. Takes daily aspirin, metoprolol, plavix, statin, and imdur 30mg  BID.  -Cont home medications as above   Tobacco use: 1/2 PPD, since she was 22. Interested in cutting back.   -Nicotine patch 14 mcg  -Discuss smoking cessation per pharmacy in the am    FEN/GI: Heart healthy diet  Prophylaxis: Home Plavix, Lovenox  Disposition: Admit to telemetry   History of Present Illness:  Belinda Werner is a 83 y.o. female, with past history significant for COPD on 3L [place], HFpEF, hypertension, and bradycardia s/p pacemaker placement, presenting with increased shortness of breath. Daughter and granddaughter are at bedside and contribute to the history provided. Family notes she has been more tired than usual over the past few days, with progressive shortness of breath. Today, she was noticed to have even further difficulty breathing, EMS was called. Denies any increase in her baseline coughing, sputum production, or fever at home. She has been taking her inhalers as prescribed, no more frequently. She can walk up and down her hallways at home without trouble. Denies any leg swelling, orthopnea, or PND. She also had a recent admission at [facility] from 8/15-8/19 for likely COPD exacerbation. Also recently saw her pulmonologist on 8/28 who continued her current therapy.    Of note, she also states she hasn't a bowel movement in 2 weeks, baseline 1-2 BM every 2 weeks. Has not tried anything for this. Denies abdominal pain, nausea, or vomiting. Granddaughter notes she "eats like a bird".   Prior to arrival to the ED, EMS started her 8L [place] after noting desaturations to 80% on her home 3L, gave 2 duoneb treatments, 125mg   solumedrol, and 2g magnesium. They noted her temperature to be 104F. On arrival to the ED, she was slightly tachycardic in mid 20's, however afebrile and otherwise hemodynamically stable on 8L [place] satting 100%. CXR showing airspace disrease in RML and lung base suspicious for pneumonia and cardiomegaly with vascular congestion. CBC and CMP notable for K 2.8, Glucose 143, Cr 1.54, total bilirubin 1.7, WBC 11, and hgb 9.7. BNP 376. Lactic acid 3.65. Received 500cc NS  bolus and potassium while in the ED.    Review Of Systems: Per HPI with the following additions:   Review of Systems  Constitutional: Positive for fever and malaise/fatigue. Negative for chills.  HENT: Positive for congestion. Negative for sore throat.   Respiratory: Positive for cough and shortness of breath. Negative for hemoptysis and sputum production.   Cardiovascular: Negative for chest pain, palpitations, orthopnea, leg swelling and PND.  Gastrointestinal: Positive for constipation. Negative for abdominal pain, diarrhea, nausea and vomiting.  Skin: Negative for rash.  Neurological: Negative for dizziness and headaches.    Patient Active Problem List   Diagnosis Date Noted  . Labile blood pressure   . Hypokalemia 05/24/2017  . Acute respiratory failure with hypoxia (HCC) 05/24/2017  . Respiratory failure (HCC) 05/24/2017  . Pain of left lower leg 09/19/2014  . Swelling of limb 09/19/2014  . Peripheral vascular disease, unspecified (HCC) 10/14/2013  . Pleural effusion 07/10/2012  . HTN (hypertension) 07/10/2012  . Acute on chronic congestive heart failure with left ventricular diastolic dysfunction (HCC) 07/09/2012  . HLD (hyperlipidemia) 07/09/2012  . COPD (chronic obstructive pulmonary disease) (HCC) 07/09/2012    Past Medical History: Past Medical History:  Diagnosis Date  . Acute on chronic congestive heart failure (HCC)   . CAD (coronary artery disease)    a. s/p CABG in 2004 with patent grafts by cath in 07/2015  . COPD (chronic obstructive pulmonary disease) (HCC)   . Diastolic dysfunction    echo 09/2008 EF 55%, mild to mod LVH, LA mild to mod dilated  . Dyspnea   . Hypercholesterolemia   . Hypertension   . MI (myocardial infarction) (HCC)    history  . Pneumonia   . PVD (peripheral vascular disease) (HCC)    refused amputation in the past     Past Surgical History: Past Surgical History:  Procedure Laterality Date  . ABDOMINAL HYSTERECTOMY    .  APPENDECTOMY    . CHOLECYSTECTOMY    . CORONARY ARTERY BYPASS GRAFT    . OTHER SURGICAL HISTORY     ?heart valve replacement  . PERIPHERAL VASCULAR CATHETERIZATION N/A 09/26/2014   Procedure: Abdominal Aortogram;  Surgeon: Nada Libman, MD;  Location: [facility] INVASIVE CV LAB;  Service: Cardiovascular;  Laterality: N/A;  . TONSILLECTOMY      Social History: Social History   Tobacco Use  . Smoking status: Current Every Day Smoker    Packs/day: 0.50    Years: 50.00    Pack years: 25.00    Types: Cigarettes  . Smokeless tobacco: Never Used  . Tobacco comment: Has not had a cigarette in 4 days as of 07/03/17.  Substance Use Topics  . Alcohol use: No    Alcohol/week: 0.0 standard drinks  . Drug use: No   Additional social history: Lives at home with granddaughter and her fiance. Smokes 1/2 PPD since she was 22.   Please also refer to relevant sections of EMR.  Family History: Family History  Problem Relation Age of Onset  . Diabetes Mother   .  Varicose Veins Mother   . Cancer Father   . Diabetes Daughter   . Heart disease Daughter   . Hypertension Daughter   . Heart attack Daughter        before age 102  . Heart disease Son   . Hypertension Son   . Heart attack Son        before age 69  . Cancer Sister     Allergies and Medications: Allergies  Allergen Reactions  . Avelox [Moxifloxacin Hcl In Nacl] Hives and Other (See Comments)    hives  . Penicillins Hives   No current facility-administered medications on file prior to encounter.    Current Outpatient Medications on File Prior to Encounter  Medication Sig Dispense Refill  . aspirin 81 MG tablet Take 81 mg by mouth daily.    Marland Kitchen atorvastatin (LIPITOR) 80 MG tablet Take 80 mg by mouth daily.      . budesonide-formoterol (SYMBICORT) 160-4.5 MCG/ACT inhaler Inhale 2 puffs into the lungs 2 (two) times daily.    . clopidogrel (PLAVIX) 75 MG tablet Take 75 mg by mouth daily.      . famotidine (PEPCID) 20 MG tablet Take 1  tablet (20 mg total) by mouth 2 (two) times daily. 60 tablet 0  . feeding supplement, ENSURE ENLIVE, (ENSURE ENLIVE) LIQD Take 237 mLs by mouth 2 (two) times daily between meals. 237 mL 12  . furosemide (LASIX) 40 MG tablet Take 40 mg by mouth 2 (two) times daily.    Marland Kitchen gabapentin (NEURONTIN) 100 MG capsule Take 300 mg by mouth 3 (three) times daily.     . Ipratropium-Albuterol (COMBIVENT RESPIMAT) 20-100 MCG/ACT AERS respimat Inhale 1 puff into the lungs every 6 (six) hours as needed for wheezing or shortness of breath. 1 Inhaler 0  . isosorbide dinitrate (ISORDIL) 10 MG tablet Take 1 tablet (10 mg total) by mouth 2 (two) times daily. 60 tablet 0  . losartan (COZAAR) 50 MG tablet Take 50 mg by mouth daily.    . metoprolol tartrate (LOPRESSOR) 25 MG tablet Take 0.5 tablets (12.5 mg total) by mouth 2 (two) times daily. 60 tablet 0  . predniSONE (DELTASONE) 10 MG tablet Prednisone 60 mg p.o. once daily for 3 days, then 40 mg p.o. once daily for 3 days, then 30 mg p.o. once daily for 3 days, then 20 mg p.o. once daily for 3 days, then 10 mg p.o. once daily for 3 days then stop. 48 tablet 0  . spironolactone (ALDACTONE) 25 MG tablet Take 0.5 tablets (12.5 mg total) by mouth daily. 30 tablet 0  . tiotropium (SPIRIVA HANDIHALER) 18 MCG inhalation capsule Place 1 capsule (18 mcg total) into inhaler and inhale daily. 30 capsule 2    Objective: BP (!) 121/35   Pulse 75   Temp 98.4 F (36.9 C) (Oral)   Resp (!) 25   Ht 5\' 1"  (1.549 m)   Wt 57.2 kg   SpO2 90%   BMI 23.81 kg/m  Exam: General: Older woman, alert, NAD, lying comfortable with NRB on  HEENT: NCAT, MM dry  Cardiac: RRR no m/g/r appreciated. No JVD.   Lungs: Slightly tachypneic. Bibasilar crackles with inspiratory rhonchi noted more prominently on the R sided lung fields, on 8L NRB transitioned down to 5L during evaluation satting 100%. Minimal increased WOB, using abdominal muscles.  Abdomen: soft, non-distended, hypoactive BS, mild  tenderness  to palpation of LLQ and RLQ Msk: Moves all extremities spontaneously  Ext: Warm, dry, 2+  distal pulses, no edema Neuro: No focal neuro deficits noted. EOMI.  Derm: Chronic venous stasis skin changes noted to BLE  Psych: appropriate affect   Labs and Imaging: CBC BMET  Recent Labs  Lab 10/28/2017 1820  WBC 11.0*  HGB 9.7*  HCT 31.8*  PLT 274   Recent Labs  Lab 11/07/2017 1820  NA 142  K 2.8*  CL 96*  CO2 30  BUN 17  CREATININE 1.54*  GLUCOSE 143*  CALCIUM 8.7*     Dg Chest 2 View  Result Date: 10/16/2017 CLINICAL DATA:  Cough, short of breath EXAM: CHEST - 2 VIEW COMPARISON:  09/24/2017, 05/25/2017, CT chest 09/25/2017 FINDINGS: Post sternotomy changes. Left-sided pacing device as before. Tiny right pleural effusion with development of right basilar and middle lobe airspace disease. Streaky atelectasis at the left base. Cardiomegaly with vascular congestion. Aortic atherosclerosis. No pneumothorax. IMPRESSION: 1. Trace right pleural effusion with development of airspace disease at the right middle lobe and lung base suspicious for a pneumonia. 2. Streaky atelectasis left base. 3. Cardiomegaly with vascular congestion. Mild asymmetric interstitial opacity in the right thorax, could be due to asymmetric edema or diffuse interstitial inflammatory process. Electronically Signed   By: Jasmine Pang M.D.   On: 11/04/2017 18:58    I have seen and evaluated the patient with Dr. Annia Friendly. I am in agreement with the note above in its revised form. My additions are in blue.  Lovena Neighbours, MD Family Medicine, PGY-3   Allayne Stack, DO 10/23/2017, 8:28 PM PGY-1, [facility] Family Medicine FPTS Intern pager: [phone], text pages welcome

## 2017-10-19 NOTE — ED Provider Notes (Signed)
[facility] EMERGENCY DEPARTMENT Provider Note   CSN: [id number] Arrival date & time: 10/27/2017  1759     History   Chief Complaint No chief complaint on file.   HPI Belinda Werner is a 83 y.o. female.  The history is provided by the patient and medical records. No language interpreter was used.  Shortness of Breath  This is a recurrent problem. The average episode lasts 3 days. The problem occurs continuously.The current episode started more than 2 days ago. The problem has been gradually worsening. Associated symptoms include a fever, cough and wheezing. Pertinent negatives include no headaches, no rhinorrhea, no neck pain, no sputum production, no hemoptysis, no chest pain, no vomiting, no abdominal pain, no rash, no leg pain, no leg swelling and no claudication. She has tried nothing for the symptoms. The treatment provided no relief. She has had prior hospitalizations. Associated medical issues include COPD, pneumonia, chronic lung disease, CAD, heart failure and past MI.    Past Medical History:  Diagnosis Date  . Acute on chronic congestive heart failure (HCC)   . CAD (coronary artery disease)    a. s/p CABG in 2004 with patent grafts by cath in 07/2015  . COPD (chronic obstructive pulmonary disease) (HCC)   . Diastolic dysfunction    echo 09/2008 EF 55%, mild to mod LVH, LA mild to mod dilated  . Dyspnea   . Hypercholesterolemia   . Hypertension   . MI (myocardial infarction) (HCC)    history  . Pneumonia   . PVD (peripheral vascular disease) (HCC)    refused amputation in the past     Patient Active Problem List   Diagnosis Date Noted  . Labile blood pressure   . Hypokalemia 05/24/2017  . Acute respiratory failure with hypoxia (HCC) 05/24/2017  . Respiratory failure (HCC) 05/24/2017  . Pain of left lower leg 09/19/2014  . Swelling of limb 09/19/2014  . Peripheral vascular disease, unspecified (HCC) 10/14/2013  . Pleural effusion 07/10/2012  .  HTN (hypertension) 07/10/2012  . Acute on chronic congestive heart failure with left ventricular diastolic dysfunction (HCC) 07/09/2012  . HLD (hyperlipidemia) 07/09/2012  . COPD (chronic obstructive pulmonary disease) (HCC) 07/09/2012    Past Surgical History:  Procedure Laterality Date  . ABDOMINAL HYSTERECTOMY    . APPENDECTOMY    . CHOLECYSTECTOMY    . CORONARY ARTERY BYPASS GRAFT    . OTHER SURGICAL HISTORY     ?heart valve replacement  . PERIPHERAL VASCULAR CATHETERIZATION N/A 09/26/2014   Procedure: Abdominal Aortogram;  Surgeon: Nada Libman, MD;  Location: [facility] INVASIVE CV LAB;  Service: Cardiovascular;  Laterality: N/A;  . TONSILLECTOMY       OB History   None      Home Medications    Prior to Admission medications   Medication Sig Start Date End Date Taking? Authorizing Provider  aspirin 81 MG tablet Take 81 mg by mouth daily.    [provider]  atorvastatin (LIPITOR) 80 MG tablet Take 80 mg by mouth daily.      [provider]  budesonide-formoterol (SYMBICORT) 160-4.5 MCG/ACT inhaler Inhale 2 puffs into the lungs 2 (two) times daily.    [provider]  clopidogrel (PLAVIX) 75 MG tablet Take 75 mg by mouth daily.      [provider]  famotidine (PEPCID) 20 MG tablet Take 1 tablet (20 mg total) by mouth 2 (two) times daily. 05/27/17   Barnetta Chapel, MD  feeding supplement, ENSURE  ENLIVE, (ENSURE ENLIVE) LIQD Take 237 mLs by mouth 2 (two) times daily between meals. 05/27/17   Barnetta Chapel, MD  furosemide (LASIX) 40 MG tablet Take 40 mg by mouth 2 (two) times daily. 07/11/12   Lollie Sails, MD  gabapentin (NEURONTIN) 100 MG capsule Take 300 mg by mouth 3 (three) times daily.  07/22/13   [provider]  Ipratropium-Albuterol (COMBIVENT RESPIMAT) 20-100 MCG/ACT AERS respimat Inhale 1 puff into the lungs every 6 (six) hours as needed for wheezing or shortness of breath. 05/27/17   Barnetta Chapel, MD    isosorbide dinitrate (ISORDIL) 10 MG tablet Take 1 tablet (10 mg total) by mouth 2 (two) times daily. 05/27/17   Barnetta Chapel, MD  losartan (COZAAR) 50 MG tablet Take 50 mg by mouth daily.    [provider]  metoprolol tartrate (LOPRESSOR) 25 MG tablet Take 0.5 tablets (12.5 mg total) by mouth 2 (two) times daily. 05/27/17   Barnetta Chapel, MD  predniSONE (DELTASONE) 10 MG tablet Prednisone 60 mg p.o. once daily for 3 days, then 40 mg p.o. once daily for 3 days, then 30 mg p.o. once daily for 3 days, then 20 mg p.o. once daily for 3 days, then 10 mg p.o. once daily for 3 days then stop. 05/27/17   Barnetta Chapel, MD  spironolactone (ALDACTONE) 25 MG tablet Take 0.5 tablets (12.5 mg total) by mouth daily. 05/28/17   Barnetta Chapel, MD  tiotropium (SPIRIVA HANDIHALER) 18 MCG inhalation capsule Place 1 capsule (18 mcg total) into inhaler and inhale daily. 05/27/17 05/27/18  Barnetta Chapel, MD    Family History Family History  Problem Relation Age of Onset  . Diabetes Mother   . Varicose Veins Mother   . Cancer Father   . Diabetes Daughter   . Heart disease Daughter   . Hypertension Daughter   . Heart attack Daughter        before age 77  . Heart disease Son   . Hypertension Son   . Heart attack Son        before age 41  . Cancer Sister     Social History Social History   Tobacco Use  . Smoking status: Current Every Day Smoker    Packs/day: 0.50    Years: 50.00    Pack years: 25.00    Types: Cigarettes  . Smokeless tobacco: Never Used  . Tobacco comment: Has not had a cigarette in 4 days as of 07/03/17.  Substance Use Topics  . Alcohol use: No    Alcohol/week: 0.0 standard drinks  . Drug use: No     Allergies   Avelox [moxifloxacin hcl in nacl] and Penicillins   Review of Systems Review of Systems  Constitutional: Positive for chills and fever. Negative for diaphoresis and fatigue.  HENT: Negative for congestion and rhinorrhea.   Eyes:  Negative for visual disturbance.  Respiratory: Positive for cough, shortness of breath and wheezing. Negative for hemoptysis, sputum production, chest tightness and stridor.   Cardiovascular: Negative for chest pain, claudication and leg swelling.  Gastrointestinal: Negative for abdominal pain, constipation, diarrhea, nausea and vomiting.  Genitourinary: Negative for flank pain and frequency.  Musculoskeletal: Negative for back pain, neck pain and neck stiffness.  Skin: Negative for rash and wound.  Neurological: Negative for light-headedness and headaches.  Psychiatric/Behavioral: Negative for agitation.  All other systems reviewed and are negative.    Physical Exam Updated Vital Signs BP (!) 129/38 (BP  Location: Right Arm)   Pulse 75   Temp 98.4 F (36.9 C) (Oral)   Resp (!) 28   Ht 5\' 1"  (1.549 m)   Wt 57.2 kg   SpO2 94%   BMI 23.81 kg/m   Physical Exam  Constitutional: She is oriented to person, place, and time. She appears well-developed and well-nourished. No distress.  HENT:  Head: Normocephalic.  Nose: Nose normal.  Mouth/Throat: Oropharynx is clear and moist. No oropharyngeal exudate.  Eyes: Pupils are equal, round, and reactive to light. Conjunctivae and EOM are normal.  Neck: Normal range of motion.  Cardiovascular: Normal rate and intact distal pulses.  No murmur heard. Pulmonary/Chest: No stridor. Tachypnea noted. She has wheezes. She has rhonchi. She has no rales. She exhibits no tenderness.  Abdominal: Soft. There is no tenderness.  Musculoskeletal: She exhibits edema (faint). She exhibits no tenderness.  Neurological: She is alert and oriented to person, place, and time.  Skin: Capillary refill takes less than 2 seconds. No rash noted. She is not diaphoretic. No erythema.  Psychiatric: She has a normal mood and affect.  Nursing note and vitals reviewed.    ED Treatments / Results  Labs (all labs ordered are listed, but only abnormal results are  displayed) Labs Reviewed  CBC WITH DIFFERENTIAL/PLATELET - Abnormal; Notable for the following components:      Result Value   WBC 11.0 (*)    RBC 3.26 (*)    Hemoglobin 9.7 (*)    HCT 31.8 (*)    Neutro Abs 9.7 (*)    All other components within normal limits  COMPREHENSIVE METABOLIC PANEL - Abnormal; Notable for the following components:   Potassium 2.8 (*)    Chloride 96 (*)    Glucose, Bld 143 (*)    Creatinine, Ser 1.54 (*)    Calcium 8.7 (*)    Total Protein 6.2 (*)    Albumin 2.8 (*)    Total Bilirubin 1.7 (*)    GFR calc non Af Amer 30 (*)    GFR calc Af Amer 35 (*)    Anion gap 16 (*)    All other components within normal limits  BRAIN NATRIURETIC PEPTIDE - Abnormal; Notable for the following components:   B Natriuretic Peptide 376.2 (*)    All other components within normal limits  I-STAT CG4 LACTIC ACID, ED - Abnormal; Notable for the following components:   Lactic Acid, Venous 3.65 (*)    All other components within normal limits  CULTURE, BLOOD (ROUTINE X 2)  CULTURE, BLOOD (ROUTINE X 2)  URINE CULTURE  LIPASE, BLOOD  URINALYSIS, ROUTINE W REFLEX MICROSCOPIC  I-STAT TROPONIN, ED  I-STAT CG4 LACTIC ACID, ED  I-STAT TROPONIN, ED    EKG EKG Interpretation  Date/Time:  Monday October 19 2017 18:53:32 EDT Ventricular Rate:  76 PR Interval:    QRS Duration: 117 QT Interval:  476 QTC Calculation: 539 R Axis:   -44 Text Interpretation:  Atrial-paced rhythm Nonspecific IVCD with LAD LVH with secondary repolarization abnormality Inferior infarct, old Anterior infarct, old When comapred to prior, no signifiant changes seen.  No STEMI Confirmed by Theda Belfast ([id number]) on 10/11/2017 7:05:13 PM   Radiology Dg Chest 2 View  Result Date: 11/04/2017 CLINICAL DATA:  Cough, short of breath EXAM: CHEST - 2 VIEW COMPARISON:  09/24/2017, 05/25/2017, CT chest 09/25/2017 FINDINGS: Post sternotomy changes. Left-sided pacing device as before. Tiny right pleural effusion  with development of right basilar and middle lobe airspace disease. Streaky  atelectasis at the left base. Cardiomegaly with vascular congestion. Aortic atherosclerosis. No pneumothorax. IMPRESSION: 1. Trace right pleural effusion with development of airspace disease at the right middle lobe and lung base suspicious for a pneumonia. 2. Streaky atelectasis left base. 3. Cardiomegaly with vascular congestion. Mild asymmetric interstitial opacity in the right thorax, could be due to asymmetric edema or diffuse interstitial inflammatory process. Electronically Signed   By: Jasmine Pang M.D.   On: 10/16/2017 18:58    Procedures Procedures (including critical care time)  CRITICAL CARE Performed by: Canary Brim Tegeler Total critical care time: 35 minutes Critical care time was exclusive of separately billable procedures and treating other patients. Critical care was necessary to treat or prevent imminent or life-threatening deterioration. Critical care was time spent personally by me on the following activities: development of treatment plan with patient and/or surrogate as well as nursing, discussions with consultants, evaluation of patient's response to treatment, examination of patient, obtaining history from patient or surrogate, ordering and performing treatments and interventions, ordering and review of laboratory studies, ordering and review of radiographic studies, pulse oximetry and re-evaluation of patient's condition.   Medications Ordered in ED Medications  aztreonam (AZACTAM) 2 g in sodium chloride 0.9 % 100 mL IVPB (has no administration in time range)  potassium chloride SA (K-DUR,KLOR-CON) CR tablet 40 mEq (has no administration in time range)  potassium chloride 10 mEq in 100 mL IVPB (has no administration in time range)  vancomycin (VANCOCIN) 1,250 mg in sodium chloride 0.9 % 250 mL IVPB (has no administration in time range)  vancomycin (VANCOCIN) 500 mg in sodium chloride 0.9 % 100  mL IVPB (has no administration in time range)  sodium chloride 0.9 % bolus 500 mL (0 mLs Intravenous Stopped 10/18/2017 1932)     Initial Impression / Assessment and Plan / ED Course  I have reviewed the triage vital signs and the nursing notes.  Pertinent labs & imaging results that were available during my care of the patient were reviewed by me and considered in my medical decision making (see chart for details).     Belinda Werner is a 83 y.o. female with a past medical history significant for CAD status post CABG and PCI, CHF, COPD on 3 L home oxygen at baseline, hypertension, hyperlipidemia, and recent admission this month at [facility] for COPD exacerbation who presents for cough, shortness of breath, and increased work of breathing.  Patient was brought in by EMS he reports that patient was having oxygen saturations in the low 80s on her home 3 L on arrival.  Patient says she had cough for several days with worsened shortness of breath.  EMS reports that she had increased work of breathing and they gave her 2 DuoNeb treatments, 125 of Solu-Medrol, and 2 g of magnesium.  With this, patient has had improvement in her significant wheezing and now has more rhonchi.  Patient denies any chest pain or chest tightness.  She denies any worsening leg pain or leg swelling.  She denies any nausea, vomiting, urinary symptoms or GI symptoms.  Patient's temperature with EMS was 104.  Patient says that she does not want to come to the emergency department but agrees with increased work of breathing.  She says she is breathing better during transport.  On my exam, patient has minimal wheezing but has significant rhonchi in the bases.  Legs have minimal edema.  Patient has no murmur and no significant chest tenderness.  Back and CVA  areas nontender.  Patient was warm to the touch, will obtain rectal temperature.  Clinically I am concerned about pneumonia or even HCAP given her recent admission for  her COPD exacerbation.  Patient will have chest x-ray and lab testing.  Documentation shows that patient had elevated troponin that was downtrending during her recent admission.  We will check this again.  Anticipate admission for increased oxygen requirement for other COPD exacerbation versus pneumonia.     Chest x-ray revealed pneumonia.  Given the patient's lactic acid, leukocytosis, worsening cough, worsened oxygen requirement, and recent admission, patient was treated for H CAP.  Antibiotics ordered and patient will be admitted for further management.    Final Clinical Impressions(s) / ED Diagnoses   Final diagnoses:  HCAP (healthcare-associated pneumonia)  Constipation    ED Discharge Orders    None      Clinical Impression: 1. HCAP (healthcare-associated pneumonia)   2. Constipation     Disposition: Admit  This note was prepared with assistance of Dragon voice recognition software. Occasional wrong-word or sound-a-like substitutions may have occurred due to the inherent limitations of voice recognition software.     Tegeler, Canary Brim, MD 2017-10-30 2125

## 2017-10-20 DIAGNOSIS — K56609 Unspecified intestinal obstruction, unspecified as to partial versus complete obstruction: Secondary | ICD-10-CM

## 2017-10-20 DIAGNOSIS — J189 Pneumonia, unspecified organism: Secondary | ICD-10-CM

## 2017-10-20 DIAGNOSIS — I5042 Chronic combined systolic (congestive) and diastolic (congestive) heart failure: Secondary | ICD-10-CM

## 2017-10-20 DIAGNOSIS — I2510 Atherosclerotic heart disease of native coronary artery without angina pectoris: Secondary | ICD-10-CM

## 2017-10-20 DIAGNOSIS — I10 Essential (primary) hypertension: Secondary | ICD-10-CM

## 2017-10-20 DIAGNOSIS — N179 Acute kidney failure, unspecified: Secondary | ICD-10-CM

## 2017-10-20 LAB — CBC
HCT: 28.1 % — ABNORMAL LOW (ref 36.0–46.0)
HEMOGLOBIN: 8.7 g/dL — AB (ref 12.0–15.0)
MCH: 29.7 pg (ref 26.0–34.0)
MCHC: 31 g/dL (ref 30.0–36.0)
MCV: 95.9 fL (ref 78.0–100.0)
Platelets: 225 10*3/uL (ref 150–400)
RBC: 2.93 MIL/uL — AB (ref 3.87–5.11)
RDW: 14.6 % (ref 11.5–15.5)
WBC: 16.2 10*3/uL — ABNORMAL HIGH (ref 4.0–10.5)

## 2017-10-20 LAB — BASIC METABOLIC PANEL
Anion gap: 15 (ref 5–15)
BUN: 23 mg/dL (ref 8–23)
CHLORIDE: 96 mmol/L — AB (ref 98–111)
CO2: 29 mmol/L (ref 22–32)
CREATININE: 1.56 mg/dL — AB (ref 0.44–1.00)
Calcium: 8.5 mg/dL — ABNORMAL LOW (ref 8.9–10.3)
GFR calc non Af Amer: 30 mL/min — ABNORMAL LOW (ref >60)
GFR, EST AFRICAN AMERICAN: 35 mL/min — AB (ref >60)
GLUCOSE: 196 mg/dL — AB (ref 70–99)
Potassium: 3.5 mmol/L (ref 3.5–5.1)
Sodium: 140 mmol/L (ref 135–145)

## 2017-10-20 LAB — LIPID PANEL
Cholesterol: 98 mg/dL (ref 0–200)
HDL: 36 mg/dL — AB (ref >40)
LDL CALC: 53 mg/dL (ref 0–99)
Total CHOL/HDL Ratio: 2.7 RATIO
Triglycerides: 45 mg/dL (ref <150)
VLDL: 9 mg/dL (ref 0–40)

## 2017-10-20 LAB — LACTIC ACID, PLASMA
Lactic Acid, Venous: 2.2 mmol/L (ref 0.5–1.9)
Lactic Acid, Venous: 1.5 mmol/L (ref 0.5–1.9)

## 2017-10-20 MED ORDER — ENOXAPARIN SODIUM 30 MG/0.3ML ~~LOC~~ SOLN
30.0000 mg | SUBCUTANEOUS | Status: DC
  Administered 2017-10-20 – 2017-10-21 (×2): 30 mg via SUBCUTANEOUS
  Filled 2017-10-20 (×2): qty 0.3

## 2017-10-20 MED ORDER — BOOST / RESOURCE BREEZE PO LIQD CUSTOM
1.0000 | Freq: Three times a day (TID) | ORAL | Status: DC
  Administered 2017-10-20 – 2017-10-21 (×5): 1 via ORAL

## 2017-10-20 MED ORDER — SORBITOL 70 % SOLN
960.0000 mL | TOPICAL_OIL | Freq: Once | ORAL | Status: AC
  Administered 2017-10-20: 960 mL via RECTAL
  Filled 2017-10-20: qty 473

## 2017-10-20 MED ORDER — IPRATROPIUM-ALBUTEROL 0.5-2.5 (3) MG/3ML IN SOLN
3.0000 mL | Freq: Three times a day (TID) | RESPIRATORY_TRACT | Status: DC
  Administered 2017-10-20 – 2017-10-21 (×5): 3 mL via RESPIRATORY_TRACT
  Filled 2017-10-20 (×5): qty 3

## 2017-10-20 MED ORDER — POTASSIUM CHLORIDE IN NACL 20-0.9 MEQ/L-% IV SOLN
INTRAVENOUS | Status: DC
  Administered 2017-10-20 (×2): via INTRAVENOUS
  Filled 2017-10-20 (×3): qty 1000

## 2017-10-20 MED ORDER — SODIUM CHLORIDE 0.9 % IV BOLUS
500.0000 mL | Freq: Once | INTRAVENOUS | Status: AC
  Administered 2017-10-20: 500 mL via INTRAVENOUS

## 2017-10-20 MED ORDER — SODIUM CHLORIDE 0.9 % IV SOLN: INTRAVENOUS | Status: DC

## 2017-10-20 NOTE — Consult Note (Addendum)
Reason for Consult:  Chronic constipation vs SBO Referring Physician:  Dr. Daylene Posey PCP:  Robyne Peers, MD - most of her care is in [facility], [place]  Chief complaint shortness of breath/confusion   Belinda Werner is an 83 y.o. female.   HPI: Patient is an 83 year old female with extensive medical history and ongoing tobacco use.  She is complaining of shortness of breath she initially refused medical treatment by the EMS.  She developed respiratory rate in 40s and saturations in the 80s. She was stabilized and transported to [facility]  Work-up in the ED shows she is was initially afebrile, diastolic pressure was low but she was hemodynamically stable.  Sats were in the 90s on nasal cannula is up to 6 L/min.   Admission creatinine was 1.54.  Potassium was 2.8, bilirubin was mildly elevated 1.7.  Other LFTs were normal.  Lipase 25.  BNP 376.  Lactate 3.65.  WBC 11.0, hemoglobin 9.7, hematocrit 31.8.  Chest x-ray on admission showed a trace right pleural effusion with airspace disease in the right middle lobe and lung base suspicious for pneumonia.  Some streaky atelectasis, the megaly with vascular congestion, mild asymmetric interstitial opacity in the right thorax could be edema or diffuse interstitial pulmonary fibrosis.  Abdominal film showed multiple loops of small bowel dilatation up to 4.3 cm scattered colon gas, mild stool in the right colon and aortic stent.  There was some concern for a small bowel obstruction. Pt says her normal is: 1 BM every 2 weeks.  She has not had any nausea or vomiting, but has not been hungry, losing 25 lbs over the last 6 months.  She thinks it has now been 3 weeks since her last BM.    Patient was seen and evaluated by the medicine service.  She is admitted for shortness of breath with a suspicion of a right middle lobe pneumonia and possible right lower lobe pneumonia.  Patient reported chronic constipation with history of 1 bowel movements every 2 weekls.  No bowel movement  for up to 3 weeks currently.    She also had acute kidney injury.  She was admitted and placed on antibiotics for her pneumonia we are asked to see her for her constipation and possible small bowel obstruction.    She does have a history of prior abdominal hysterectomy, appendectomy and cholecystectomy.  History of prior CABG,, congestive heart failure, COPD with ongoing tobacco on home O2.  , peripheral vascular disease with prior left common and external iliac artery with a 8 x 60 Cordis self-expanding stent by Dr. Trula Slade, 09/26/14. Left carotid stent and right CE.    Past Medical History:  Diagnosis Date  . Acute on chronic congestive heart failure ([place])  Last hospitalization here 05/27/17   . CAD (coronary artery disease)    a. s/p CABG in 2004 with patent grafts by cath in 07/2015  . COPD (chronic obstructive pulmonary disease) ([place])   . Diastolic dysfunction    echo 09/2008 EF 55%, mild to mod LVH, LA mild to mod dilated  . Dyspnea   . Hypercholesterolemia   . Hypertension   . MI (myocardial infarction) ([place])    history  . Pneumonia   . PVD (peripheral vascular disease) ([place])    refused amputation in the past     Past Surgical History:  Procedure Laterality Date  . ABDOMINAL HYSTERECTOMY    . APPENDECTOMY    . CHOLECYSTECTOMY    . CORONARY ARTERY BYPASS GRAFT    .  OTHER SURGICAL HISTORY     ?heart valve replacement  . PERIPHERAL VASCULAR CATHETERIZATION N/A 09/26/2014   Procedure: Abdominal Aortogram;  Surgeon: Serafina Mitchell, MD;  Location: [facility];  Service: Cardiovascular;  Laterality: N/A;  . TONSILLECTOMY      Family History  Problem Relation Age of Onset  . Diabetes Mother   . Varicose Veins Mother   . Cancer Father   . Diabetes Daughter   . Heart disease Daughter   . Hypertension Daughter   . Heart attack Daughter        before age 81  . Heart disease Son   . Hypertension Son   . Heart attack Son        before age 81  . Cancer Sister      Social History:  reports that she has been smoking cigarettes. She has a 25.00 pack-year smoking history. She has never used smokeless tobacco. She reports that she does not drink alcohol or use drugs.  Ongoing tobacco use x 50 years ETOH:  None DRugs:  None Lives with her son  Allergies:  Allergies  Allergen Reactions  . Adhesive [Tape] Other (See Comments)    PULLS OFF SKIN!!  . Avelox [Moxifloxacin Hcl In Nacl] Hives and Rash  . Penicillins Hives    Has patient had a PCN reaction causing immediate rash, facial/tongue/throat swelling, SOB or lightheadedness with hypotension: Yes Has patient had a PCN reaction causing severe rash involving mucus membranes or skin necrosis: Unk Has patient had a PCN reaction that required hospitalization: No; was in hosp already Has patient had a PCN reaction occurring within the last 10 years: Yes If all of the above answers are "NO", then may proceed with Cephalosporin use.     Medications:  Prior to Admission:  Medications Prior to Admission  Medication Sig Dispense Refill Last Dose  . albuterol (PROAIR HFA) 108 (90 Base) MCG/ACT inhaler Inhale 2 puffs into the lungs every 4 (four) hours as needed for wheezing or shortness of breath.   10/31/2017 at Unknown time  . aspirin 81 MG tablet Take 81 mg by mouth daily.   11/04/2017 at 0900  . atorvastatin (LIPITOR) 80 MG tablet Take 80 mg by mouth daily.     10/15/2017 at Unknown time  . budesonide-formoterol (SYMBICORT) 160-4.5 MCG/ACT inhaler Inhale 2 puffs into the lungs 2 (two) times daily.   11/04/2017 at Unknown time  . clopidogrel (PLAVIX) 75 MG tablet Take 75 mg by mouth daily.     10/28/2017 at 0900  . diltiazem (CARDIZEM CD) 240 MG 24 hr capsule Take 240 mg by mouth daily.   11/04/2017 at Unknown time  . famotidine (PEPCID) 20 MG tablet Take 1 tablet (20 mg total) by mouth 2 (two) times daily. 60 tablet 0 11/05/2017 at Unknown time  . flecainide (TAMBOCOR) 100 MG tablet Take 100 mg by mouth every 12  (twelve) hours.   11/06/2017 at 0900  . furosemide (LASIX) 40 MG tablet Take 40 mg by mouth 2 (two) times daily.   10/14/2017 at Unknown time  . gabapentin (NEURONTIN) 100 MG capsule Take 100 mg by mouth 3 (three) times daily.    10/27/2017 at Unknown time  . isosorbide mononitrate (IMDUR) 30 MG 24 hr tablet Take 30 mg by mouth 2 (two) times daily.   10/28/2017 at am  . losartan (COZAAR) 50 MG tablet Take 50 mg by mouth daily.   10/18/2017 at am  . metoprolol succinate (TOPROL-XL) 50 MG  24 hr tablet Take 50 mg by mouth daily. Take with or immediately following a meal.   10/25/2017 at 0900  . nitroGLYCERIN (NITROLINGUAL) 0.4 MG/SPRAY spray Place 1 spray under the tongue every 5 (five) minutes as needed for chest pain.    Unk at Unk  . omeprazole (PRILOSEC) 40 MG capsule Take 40 mg by mouth daily.   11/01/2017 at Unknown time  . OXYGEN Inhale 3-4 L into the lungs continuous.   Continuous at Continuous  . tiotropium (SPIRIVA HANDIHALER) 18 MCG inhalation capsule Place 1 capsule (18 mcg total) into inhaler and inhale daily. 30 capsule 2 10/18/2017 at Unknown time   Scheduled: . aspirin  81 mg Oral Daily  . atorvastatin  80 mg Oral Daily  . clopidogrel  75 mg Oral Daily  . diltiazem  240 mg Oral Daily  . enoxaparin (LOVENOX) injection  40 mg Subcutaneous Q24H  . famotidine  20 mg Oral BID  . flecainide  100 mg Oral Q12H  . ipratropium-albuterol  3 mL Nebulization TID  . isosorbide dinitrate  10 mg Oral BID  . isosorbide mononitrate  30 mg Oral BID  . metoprolol succinate  50 mg Oral Daily  . nicotine  14 mg Transdermal Daily  . pantoprazole  80 mg Oral Daily  . predniSONE  50 mg Oral Q breakfast  . spironolactone  12.5 mg Oral Daily   Continuous: . 0.9 % NaCl with KCl 40 mEq / L 75 mL/hr (10/20/17 0911)  . vancomycin     Anti-infectives (From admission, onward)   Start     Dose/Rate Route Frequency Ordered Stop   10/20/17 2030  vancomycin (VANCOCIN) 500 mg in sodium chloride 0.9 % 100 mL IVPB     500  mg 100 mL/hr over 60 Minutes Intravenous Every 24 hours 11/03/2017 2026     11/06/2017 2030  vancomycin (VANCOCIN) 1,250 mg in sodium chloride 0.9 % 250 mL IVPB     1,250 mg 166.7 mL/hr over 90 Minutes Intravenous  Once 10/18/2017 2026 10/14/2017 2302   11/05/2017 2015  aztreonam (AZACTAM) 2 g in sodium chloride 0.9 % 100 mL IVPB     2 g 200 mL/hr over 30 Minutes Intravenous  Once 11/07/2017 2013 10/28/2017 2313      Results for orders placed or performed during the hospital encounter of 10/13/2017 (from the past 48 hour(s))  CBC with Differential     Status: Abnormal   Collection Time: 10/20/2017  6:20 PM  Result Value Ref Range   WBC 11.0 (H) 4.0 - 10.5 K/uL   RBC 3.26 (L) 3.87 - 5.11 MIL/uL   Hemoglobin 9.7 (L) 12.0 - 15.0 g/dL   HCT 31.8 (L) 36.0 - 46.0 %   MCV 97.5 78.0 - 100.0 fL   MCH 29.8 26.0 - 34.0 pg   MCHC 30.5 30.0 - 36.0 g/dL   RDW 14.6 11.5 - 15.5 %   Platelets 274 150 - 400 K/uL   Neutrophils Relative % 88 %   Lymphocytes Relative 6 %   Monocytes Relative 4 %   Eosinophils Relative 1 %   Basophils Relative 1 %   Neutro Abs 9.7 (H) 1.7 - 7.7 K/uL   Lymphs Abs 0.7 0.7 - 4.0 K/uL   Monocytes Absolute 0.4 0.1 - 1.0 K/uL   Eosinophils Absolute 0.1 0.0 - 0.7 K/uL   Basophils Absolute 0.1 0.0 - 0.1 K/uL   WBC Morphology INCREASED BANDS (>20% BANDS)     Comment: Performed at [facility], [place] [address], [place], [place] [address]  Comprehensive metabolic panel     Status: Abnormal   Collection Time: 10/14/2017  6:20 PM  Result Value Ref Range   Sodium 142 135 - 145 mmol/L   Potassium 2.8 (L) 3.5 - 5.1 mmol/L   Chloride 96 (L) 98 - 111 mmol/L   CO2 30 22 - 32 mmol/L   Glucose, Bld 143 (H) 70 - 99 mg/dL   BUN 17 8 - 23 mg/dL   Creatinine, Ser 1.54 (H) 0.44 - 1.00 mg/dL   Calcium 8.7 (L) 8.9 - 10.3 mg/dL   Total Protein 6.2 (L) 6.5 - 8.1 g/dL   Albumin 2.8 (L) 3.5 - 5.0 g/dL   AST 22 15 - 41 U/L   ALT 10 0 - 44 U/L   Alkaline Phosphatase 66 38 - 126 U/L   Total Bilirubin  1.7 (H) 0.3 - 1.2 mg/dL   GFR calc non Af Amer 30 (L) >60 mL/min   GFR calc Af Amer 35 (L) >60 mL/min    Comment: (NOTE) The eGFR has been calculated using the CKD EPI equation. This calculation has not been validated in all clinical situations. eGFR's persistently <60 mL/min signify possible Chronic Kidney Disease.    Anion gap 16 (H) 5 - 15    Comment: Performed at [facility], [place] [address] [place], [place] [address]  Lipase, blood     Status: None   Collection Time: 10/12/2017  6:20 PM  Result Value Ref Range   Lipase 25 11 - 51 U/L    Comment: Performed at [facility] [address], [place], [place] [address]  Brain natriuretic peptide     Status: Abnormal   Collection Time: 11/03/2017  6:20 PM  Result Value Ref Range   B Natriuretic Peptide 376.2 (H) 0.0 - 100.0 pg/mL    Comment: Performed at [facility] [address] [place], [place] [address]  Blood culture (routine x 2)     Status: None (Preliminary result)   Collection Time: 10/15/2017  6:20 PM  Result Value Ref Range   Specimen Description BLOOD RIGHT WRIST    Special Requests      BOTTLES DRAWN AEROBIC AND ANAEROBIC Blood Culture results may not be optimal due to an excessive volume of blood received in culture bottles   Culture      NO GROWTH < 12 HOURS Performed at [facility] [address] [place], [place] [address]    Report Status PENDING   I-stat troponin, ED     Status: None   Collection Time: 11/09/2017  6:24 PM  Result Value Ref Range   Troponin i, poc 0.04 0.00 - 0.08 ng/mL   Comment 3            Comment: Due to the release kinetics of cTnI, a negative result within the first hours of the onset of symptoms does not rule out myocardial infarction with certainty. If myocardial infarction is still suspected, repeat the test at appropriate intervals.   I-Stat CG4 Lactic Acid, ED     Status: Abnormal   Collection Time: 10/15/2017  6:27 PM  Result Value Ref Range   Lactic  Acid, Venous 3.65 (HH) 0.5 - 1.9 mmol/L   Comment NOTIFIED PHYSICIAN   Blood culture (routine x 2)     Status: None (Preliminary result)   Collection Time: 10/28/2017  6:45 PM  Result Value Ref Range   Specimen  Description BLOOD LEFT ANTECUBITAL    Special Requests      BOTTLES DRAWN AEROBIC AND ANAEROBIC Blood Culture results may not be optimal due to an excessive volume of blood received in culture bottles   Culture      NO GROWTH < 12 HOURS Performed at [facility] [address], [place], [place] [address]    Report Status PENDING   I-stat troponin, ED     Status: None   Collection Time: 10/26/2017 10:44 PM  Result Value Ref Range   Troponin i, poc 0.07 0.00 - 0.08 ng/mL   Comment 3            Comment: Due to the release kinetics of cTnI, a negative result within the first hours of the onset of symptoms does not rule out myocardial infarction with certainty. If myocardial infarction is still suspected, repeat the test at appropriate intervals.   I-Stat CG4 Lactic Acid, ED     Status: None   Collection Time: 10/15/2017 10:46 PM  Result Value Ref Range   Lactic Acid, Venous 1.00 0.5 - 1.9 mmol/L  Lactic acid, plasma     Status: Abnormal   Collection Time: 10/18/2017 11:28 PM  Result Value Ref Range   Lactic Acid, Venous 2.2 (HH) 0.5 - 1.9 mmol/L    Comment: CRITICAL RESULT CALLED TO, READ BACK BY AND VERIFIED WITH: NEILSEN,T RN 10/20/2017 0123 JORDANS Performed at [facility], [place] [address], [place], [place] [address]   Basic metabolic panel     Status: Abnormal   Collection Time: 10/20/17  3:03 AM  Result Value Ref Range   Sodium 140 135 - 145 mmol/L   Potassium 3.5 3.5 - 5.1 mmol/L    Comment: DELTA CHECK NOTED   Chloride 96 (L) 98 - 111 mmol/L   CO2 29 22 - 32 mmol/L   Glucose, Bld 196 (H) 70 - 99 mg/dL   BUN 23 8 - 23 mg/dL   Creatinine, Ser 1.56 (H) 0.44 - 1.00 mg/dL   Calcium 8.5 (L) 8.9 - 10.3 mg/dL   GFR calc non Af Amer 30 (L) >60 mL/min   GFR calc  Af Amer 35 (L) >60 mL/min    Comment: (NOTE) The eGFR has been calculated using the CKD EPI equation. This calculation has not been validated in all clinical situations. eGFR's persistently <60 mL/min signify possible Chronic Kidney Disease.    Anion gap 15 5 - 15    Comment: Performed at [facility] [address], [place], [place] [address]  CBC     Status: Abnormal   Collection Time: 10/20/17  3:03 AM  Result Value Ref Range   WBC 16.2 (H) 4.0 - 10.5 K/uL   RBC 2.93 (L) 3.87 - 5.11 MIL/uL   Hemoglobin 8.7 (L) 12.0 - 15.0 g/dL   HCT 28.1 (L) 36.0 - 46.0 %   MCV 95.9 78.0 - 100.0 fL   MCH 29.7 26.0 - 34.0 pg   MCHC 31.0 30.0 - 36.0 g/dL   RDW 14.6 11.5 - 15.5 %   Platelets 225 150 - 400 K/uL    Comment: Performed at [facility], [place] [address] [place], [place] [address]  Lactic acid, plasma     Status: None   Collection Time: 10/20/17  3:03 AM  Result Value Ref Range   Lactic Acid, Venous 1.5 0.5 - 1.9 mmol/L    Comment: Performed at [facility]  [address], [place], [place] [address]  Lipid panel     Status: Abnormal   Collection Time: 10/20/17  3:03 AM  Result Value Ref Range   Cholesterol 98 0 - 200 mg/dL   Triglycerides 45 <150 mg/dL   HDL 36 (L) >40 mg/dL   Total CHOL/HDL Ratio 2.7 RATIO   VLDL 9 0 - 40 mg/dL   LDL Cholesterol 53 0 - 99 mg/dL    Comment:        Total Cholesterol/HDL:CHD Risk Coronary Heart Disease Risk Table                     Men   Women  1/2 Average Risk   3.4   3.3  Average Risk       5.0   4.4  2 X Average Risk   9.6   7.1  3 X Average Risk  23.4   11.0        Use the calculated Patient Ratio above and the CHD Risk Table to determine the patient's CHD Risk.        ATP III CLASSIFICATION (LDL):  <100     mg/dL   Optimal  100-129  mg/dL   Near or Above                    Optimal  130-159  mg/dL   Borderline  160-189  mg/dL   High  >190     mg/dL   Very High Performed at [facility] [address], [place], [place] [address]     Dg Chest 2 View  Result Date: 10/12/2017 CLINICAL DATA:  Cough, short of breath EXAM: CHEST - 2 VIEW COMPARISON:  09/24/2017, 05/25/2017, CT chest 09/25/2017 FINDINGS: Post sternotomy changes. Left-sided pacing device as before. Tiny right pleural effusion with development of right basilar and middle lobe airspace disease. Streaky atelectasis at the left base. Cardiomegaly with vascular congestion. Aortic atherosclerosis. No pneumothorax. IMPRESSION: 1. Trace right pleural effusion with development of airspace disease at the right middle lobe and lung base suspicious for a pneumonia. 2. Streaky atelectasis left base. 3. Cardiomegaly with vascular congestion. Mild asymmetric interstitial opacity in the right thorax, could be due to asymmetric edema or diffuse interstitial inflammatory process. Electronically Signed   By: Donavan Foil M.D.   On: 10/12/2017 18:58   Dg Abd 1 View  Result Date: 10/27/2017 CLINICAL DATA:  Constipation EXAM: ABDOMEN - 1 VIEW COMPARISON:  09/30/2015 FINDINGS: Interstitial and alveolar disease at the right base.  Cardiomegaly. Multiple loops of dilated small bowel, measuring up to 4.3 cm. Scattered colon gas is noted. Mild stool in the right colon. Aortoiliac stent. IMPRESSION: 1. Multiple dilated loops of small bowel, suggesting bowel obstruction 2. Interstitial and alveolar disease at the right lung base, possible pneumonia. Electronically Signed   By: Donavan Foil M.D.   On: 10/18/2017 21:52    Review of Systems  Constitutional: Positive for weight loss (25 lbs last 6 months). Negative for chills and fever.  HENT: Positive for hearing loss.   Eyes: Negative.   Respiratory: Positive for shortness of breath (orhtopnea, DOE, she can walk in house, down drive way but not up. ) and wheezing.        She continues to smoke 1/2 PPD, takes off her O2 to smoke.  Cardiovascular: Positive for orthopnea, claudication and leg swelling. Negative for chest  pain, palpitations and PND.  Gastrointestinal: Positive for abdominal pain (diffuse abdominal discomfort),  constipation (average per pt this AM 1 BM q2weeks) and heartburn. Negative for blood in stool, diarrhea, nausea and vomiting.       She says she had to dig out stool last time she went.    Genitourinary: Negative.   Musculoskeletal:       Leg pain more left than right  Skin: Negative.   Neurological: Negative.   Endo/Heme/Allergies: Negative.   Psychiatric/Behavioral: Negative.    Blood pressure 130/66, pulse 75, temperature 98.7 F (37.1 C), temperature source Oral, resp. rate (!) 24, height '5\' 1"'$  (1.549 m), weight 60 kg, SpO2 94 %. Physical Exam  Constitutional: She is oriented to person, place, and time.  Chronically ill appearing woman, with significant pulmonary congestion, cough, on O2  HENT:  Head: Normocephalic and atraumatic.  Mouth/Throat: Oropharynx is clear and moist. No oropharyngeal exudate.  Eyes: Right eye exhibits no discharge. Left eye exhibits no discharge. No scleral icterus.  Pupils are equal  Neck: Normal range of motion. Neck supple. No JVD present. No tracheal deviation present. No thyromegaly present.  Right CE scar, right carotid bruit  Cardiovascular: Normal rate, regular rhythm and normal heart sounds.  No distal pedal pulses  Good right femoral, ?+1 left femoral pulse  Respiratory: Effort normal. No respiratory distress. She has wheezes. She has rales. She exhibits no tenderness.  Mid sternotomy scar  GI: Soft. Bowel sounds are normal. She exhibits distension. She exhibits no mass. There is tenderness. There is no rebound and no guarding.  Musculoskeletal: She exhibits no edema or tenderness.  Lymphadenopathy:    She has no cervical adenopathy.  Neurological: She is alert and oriented to person, place, and time. No cranial nerve deficit.  Skin: Skin is warm and dry. No erythema.  bilaterl lower extremity venous stasis changes  Psychiatric: She has a  normal mood and affect. Her behavior is normal. Judgment and thought content normal.  Appears tired and worn out.    Assessment/Plan: RML/RLL Pneumonia  COPD/on Home O2 - 24/7/ongoing tobacco use CAD/Hx of CHF/CABG 2004 PVD/hx  RCE/Hx of left carotid stent/ left common iliac stent Hypertension Dyslipidemia  Chronic constipation, no BM x 3 weeks - vs SBO - no prior hx of SBO Hx of abdominal hysterectomy/appendectomy/cholecystectomy  FEN:  NPO/IV fluids ID:  Aztreonam/vancomycin 9/9 =>>day 2 DVT: SCD's added Follow up:  TBD  Imp:  She has no sx of SBO, but has had ongoing chronic constipation.  I would start with enemas and see if this gives her relief.  She needs to be on a bowel regime, but I would agree with enemas  and get some results before starting this.  She needs to be on Potassium and get this up to 4.0 range.  She has been tolerating diet, but not eating a lot at home. I would allow her clears and see how she does.  If she develops nausea or vomiting recheck film and place NG today.   We will recheck film in AM.    Darianny Momon 10/20/2017, 10:03 AM

## 2017-10-20 NOTE — Progress Notes (Addendum)
Initial Nutrition Assessment  DOCUMENTATION CODES:   Not applicable  INTERVENTION:     Boost Breeze po TID, each supplement provides 250 kcal and 9 grams of protein  NUTRITION DIAGNOSIS:   Increased nutrient needs related to acute illness, chronic illness as evidenced by estimated needs  GOAL:   Patient will meet greater than or equal to 90% of their needs  MONITOR:   PO intake, Supplement acceptance, Labs, Skin, Weight trends, I & O's  REASON FOR ASSESSMENT:   Malnutrition Screening Tool  ASSESSMENT:   83 y.o. Female who presented with SOB, increase work of breathing, fever and found to have a pneumonia on CXR. PMH is significant for COPD, HFpEF, hypokalemia, hypertension, CAD s/p stent placement, PAD, hyperlipidemia.  RD spoke with patient at bedside. She is on and off her cell phone. She is currently NPO. ? possible SBO. Says she wants something to eat. Pt reports a decreased appetite for the last 6 months. Noted some constipation.  Also endorses a 25 lb unintentional weight loss during this time. Per readings below, pt has had a 9% weight loss since 05/2017. Labs and medications reviewed. Cl 96 (L).  Pt shares she does not like Ensure and/or Boost supplements. Agreeable to try Boost Breeze (clear liquid supplement). Surgery note 9/10 reviewed.  Verbal order with Read Back received per Dr. Selena Batten to order Clear Liquid diet.  NUTRITION - FOCUSED PHYSICAL EXAM:  Deferred at this time  Diet Order:   Diet Order            Diet clear liquid Room service appropriate? Yes; Fluid consistency: Thin  Diet effective now            EDUCATION NEEDS:   No education needs have been identified at this time  Skin:  Skin Assessment: Reviewed RN Assessment  Last BM:  PTA  Height:   Ht Readings from Last 1 Encounters:  10/26/2017 5\' 1"  (1.549 m)   Weight:   Wt Readings from Last 1 Encounters:  10/20/17 60 kg   Wt Readings from Last 10 Encounters:  10/20/17 60 kg   07/03/17 63.5 kg  05/27/17 66.3 kg  11/06/16 69.9 kg  09/30/15 71.7 kg  07/16/15 73 kg  04/30/15 72.1 kg  10/09/14 73.6 kg  09/26/14 71.2 kg  09/19/14 71.2 kg   BMI:  Body mass index is 24.99 kg/m.  Estimated Nutritional Needs:   Kcal:  1500-1700  Protein:  70-85 gm  Fluid:  1.5-1.7 L  Maureen Chatters, RD, LDN Pager #: [phone] After-Hours Pager #: [phone]

## 2017-10-20 NOTE — ED Notes (Signed)
Admitting resident informed attempt at interrogating pacemaker unsuccessful, no further orders at this time

## 2017-10-20 NOTE — Progress Notes (Signed)
Family Medicine Teaching Service Daily Progress Note Intern Pager: [phone]  Patient name: Belinda Werner Medical record number: [id number] Date of birth: 1934-12-21 Age: 83 y.o. Gender: female  Primary Care Provider: Angelica Chessman, MD Consultants: Gen surgery Code Status: DNR  Pt Overview and Major Events to Date:  9/10 - Admitted  Assessment and Plan: Belinda Werner is a 83 y.o. female presenting with shortness of breath, increase work of breathing, fever and found to have a RML and RLL pneumonia  on CXR. PMH is significant for COPD, HFpEF, hypokalemia, hypertension, CAD s/p stent placement, PAD, and hyperlipidemia.  Acute-on-chronic hypoxic respiratory failure, likely 2/2 to COPD exacerbation in the setting of a RML/RLL PNA, stable: concern for HAP due to recent hospitalization, also meeting sepsis criteria with tachypnea to mid 20's, WBC 11 (with left shift), fever prior to arrival, and additional lactic acid of 3.65, is however currently afebrile. CXR showing airspace disease at RML and lung base suspicious for HAP, and requiring oxygen above her baseline. -S/p 125 mg solu-medrol via EMS; start prednisone 50 mg daily in the am  -Admit to tele, admitting attending Dr. Jennette Kettle  -Continue duonebs scheduled q4  -Albuterol PRN q2  -Start Vancomycin and Aztreonam (d/t penicillin allergies)  -Started patient on NS +KCL 75 ml/hr > increased to 100cc/hr with 500cc bolus -Trend lactic acid: 3.65 > 1 > 2.2 > 1.5 (9/10) -Follow up CBC and BMP -F/u blood cultures -Vitals per routine  -Wean oxygen as tolerated; goal 88-92% spO2 -Incentive spirometer   Acute kidney injury: Cr 1.54 on admit, baseline around 0.8. Likely prerenal 2/2 decreased PO and fluid intake (albumin 2.8) reported by family, also in the setting of lasix and spironolactone BID therapy.  -100 ml/hr NS mIVF x 10 hours  -Monitor BMP  -Avoid nephrotoxic medications    Hypokalemia: K 2.8 on admit, corrected to 3.5. Likely  multifactorial in the setting of poor po intake and frequent chronic lasix use. Patient also have evidence of bowel obstruction, hypokalemia also contributing. Will replete with goal >4.0 however will need to be careful given patient current AKI. -S/p 10 meq IV + 40 meq PO in the ED  -40 meq in mIVF   -Monitor BMP   COPD: on 3L at home chronically, symbicort, combivent, and spiriva at home; on 5L [place] on 9/10 likely due to pneumonia. Follows with Dr. Su Monks (pulmo) and recently saw him 8/28, no new changes made to her medical therapy. Continues to smoke tobacco, given nicotine patch. -Duonebs q4 scheduled  -Albuterol q2 PRN   -Hold home symbicort, combivent, and Spiriva, resume when clinically appropriate -Continue prednisone 50 mg daily -Discuss smoking cessation as below    Obstipation, chronic, worsening: patient denies BM in 2 weeks, usually has BM once weekly, tenderness to palpation in LLQ with hypoactive BS on exam.  -KUB obtained - results below -Rectal exam performed - no concerning findings -Ordered SMOG Enema -NPO to Clear liquids per Surgery -mIVF increased from 75cc/hr to 100cc/hr -Consider bowel regimen to address chronic constipation upon discharge  Peripheral Vascular disease, chronic, stable: stented L common and external iliac artery on 09/2014. Patient had 80% stenosis noted. Patient is followed by vascular. On plavix 75 mg daily. -Hold Plavix  Normocytic anemia of chronic disease, stable: Hgb 9.7 on admit, Baseline 9-10. Likely secondary to chronic disease burden. Ferritin 141 in 05/2017.  -Monitor CBC    Sick Sinus Syndrome, stable: with history of bradycardia s/p pacemaker placement and paroxysmal atrial tachycardia; HR in the  70's on 10/20/2017. Pt reports no issues with her pacemaker. EKG unchanged from prior. -Interrogate pacemaker  -Monitor HR  -Continue diltiazem 240 mg daily, Flecainide 100 mg bid, Metoprolol succinate 50 mg daily  HFpEF: Echo 05/2017 with EF  65-70% and G2DD. Appears euvolemic on exam. Takes lasix 40mg  BID and spironolactone 25 mg daily at home for control.  -Hold home medications given AKI and fluid resuscitation; consideration for restarting during hospitalization  -Cont home Metoprolol XR 50mg  -Cont home Atorvastatin 80mg   -Hold Losartan 50mg  d/t AKI  Hypertension: Normotensive on admit, 120's systolic. Takes Losartan, metoprolol 50, lasix, and spironolactone at home for control.  -Cont home metoprolol -Hold home losartan, lasix, spironolactone in setting of AKI   Hyperlipidemia: Last LDL 44 in 06/2012.  -Lipid panel shows low HDL at 36, otherwise wnl -Cont home atorvastatin 80  CAD s/p CABG: In 2004, patent grafts noted during cardiac cath in 2017. No anginal symptoms at this time. Takes daily aspirin, metoprolol, plavix, statin, and imdur 30mg  BID.  -Hold Plavix -Cont home medications as above   Tobacco use: 1/2 PPD, since she was 22. Interested in cutting back.  -Nicotine patch 14 mcg  -Discuss smoking cessation per pharmacy in the am   FEN/GI: Heart healthy diet  Prophylaxis: Home Plavix, Lovenox  Disposition: Admit to telemetry   Subjective:  Patient seen this morning resting in bed with nasal cannula in place, oxygen turned up to 5 L.  She states she feels better this morning but is still not back to her baseline.  This morning she denies headaches, blurred vision, diaphoresis, chills, chest pain, nausea, and vomiting. She reports burping but denies gas.  Objective: Temp:  [98.4 F (36.9 C)-98.7 F (37.1 C)] 98.7 F (37.1 C) (09/10 0741) Pulse Rate:  [70-77] 75 (09/10 0741) Resp:  [13-33] 24 (09/10 0741) BP: (106-147)/(33-79) 130/66 (09/10 0741) SpO2:  [89 %-100 %] 94 % (09/10 0804) Weight:  [57.2 kg-60 kg] 60 kg (09/10 0350)  Physical Exam  Constitutional: She is oriented to person, place, and time. She appears well-developed and well-nourished. No distress.  HENT:  Head: Normocephalic and  atraumatic.  Eyes: EOM are normal.  Neck: Normal range of motion. No thyromegaly present.  Cardiovascular: Normal rate and regular rhythm.  Pulmonary/Chest: Effort normal and breath sounds normal. No respiratory distress.  Coarse breath sounds throughout with R worse than L.  Abdominal: Soft. She exhibits distension. She exhibits no mass. There is tenderness (to palpation). There is no guarding.  - Bowel sounds scarce and high pitched in nature. - Rectal exam: external hemorrhoids without bleeding or irritation; stool present in the rectum without obvious fecal impaction  Musculoskeletal: Normal range of motion. She exhibits no edema or deformity.  Lymphadenopathy:    She has no cervical adenopathy.  Neurological: She is alert and oriented to person, place, and time.  Skin: Skin is warm and dry. Capillary refill takes less than 2 seconds.  Psychiatric: She has a normal mood and affect. Her behavior is normal.   Laboratory: Recent Labs  Lab Oct 26, 2017 1820 10/20/17 0303  WBC 11.0* 16.2*  HGB 9.7* 8.7*  HCT 31.8* 28.1*  PLT 274 225   Recent Labs  Lab 10-26-2017 1820 10/20/17 0303  NA 142 140  K 2.8* 3.5  CL 96* 96*  CO2 30 29  BUN 17 23  CREATININE 1.54* 1.56*  CALCIUM 8.7* 8.5*  PROT 6.2*  --   BILITOT 1.7*  --   ALKPHOS 66  --  ALT 10  --   AST 22  --   GLUCOSE 143* 196*    Imaging/Diagnostic Tests: Dg Chest 2 View  Result Date: 10-25-2017 CLINICAL DATA:  Cough, short of breath EXAM: CHEST - 2 VIEW COMPARISON:  09/24/2017, 05/25/2017, CT chest 09/25/2017 FINDINGS: Post sternotomy changes. Left-sided pacing device as before. Tiny right pleural effusion with development of right basilar and middle lobe airspace disease. Streaky atelectasis at the left base. Cardiomegaly with vascular congestion. Aortic atherosclerosis. No pneumothorax. IMPRESSION: 1. Trace right pleural effusion with development of airspace disease at the right middle lobe and lung base suspicious for a  pneumonia. 2. Streaky atelectasis left base. 3. Cardiomegaly with vascular congestion. Mild asymmetric interstitial opacity in the right thorax, could be due to asymmetric edema or diffuse interstitial inflammatory process. Electronically Signed   By: Jasmine Pang M.D.   On: 25-Oct-2017 18:58   Dg Abd 1 View  Result Date: October 25, 2017 CLINICAL DATA:  Constipation EXAM: ABDOMEN - 1 VIEW COMPARISON:  09/30/2015 FINDINGS: Interstitial and alveolar disease at the right base.  Cardiomegaly. Multiple loops of dilated small bowel, measuring up to 4.3 cm. Scattered colon gas is noted. Mild stool in the right colon. Aortoiliac stent. IMPRESSION: 1. Multiple dilated loops of small bowel, suggesting bowel obstruction 2. Interstitial and alveolar disease at the right lung base, possible pneumonia. Electronically Signed   By: Jasmine Pang M.D.   On: Oct 25, 2017 21:52    Dollene Cleveland, DO 10/20/2017, 9:50 AM PGY-1, [facility] FPTS Intern pager: [phone], text pages welcome

## 2017-10-21 ENCOUNTER — Inpatient Hospital Stay (HOSPITAL_COMMUNITY): Payer: Medicare Other

## 2017-10-21 DIAGNOSIS — K56609 Unspecified intestinal obstruction, unspecified as to partial versus complete obstruction: Secondary | ICD-10-CM

## 2017-10-21 DIAGNOSIS — K5909 Other constipation: Secondary | ICD-10-CM

## 2017-10-21 LAB — BASIC METABOLIC PANEL
Anion gap: 13 (ref 5–15)
BUN: 30 mg/dL — ABNORMAL HIGH (ref 8–23)
CALCIUM: 7.9 mg/dL — AB (ref 8.9–10.3)
CO2: 24 mmol/L (ref 22–32)
Chloride: 106 mmol/L (ref 98–111)
Creatinine, Ser: 1.54 mg/dL — ABNORMAL HIGH (ref 0.44–1.00)
GFR calc non Af Amer: 30 mL/min — ABNORMAL LOW (ref >60)
GFR, EST AFRICAN AMERICAN: 35 mL/min — AB (ref >60)
GLUCOSE: 132 mg/dL — AB (ref 70–99)
Potassium: 4.4 mmol/L (ref 3.5–5.1)
SODIUM: 143 mmol/L (ref 135–145)

## 2017-10-21 LAB — CBC
HCT: 26.9 % — ABNORMAL LOW (ref 36.0–46.0)
HEMOGLOBIN: 8.1 g/dL — AB (ref 12.0–15.0)
MCH: 29.5 pg (ref 26.0–34.0)
MCHC: 30.1 g/dL (ref 30.0–36.0)
MCV: 97.8 fL (ref 78.0–100.0)
Platelets: 277 10*3/uL (ref 150–400)
RBC: 2.75 MIL/uL — ABNORMAL LOW (ref 3.87–5.11)
RDW: 14.9 % (ref 11.5–15.5)
WBC: 11.2 10*3/uL — ABNORMAL HIGH (ref 4.0–10.5)

## 2017-10-21 MED ORDER — POLYETHYLENE GLYCOL 3350 17 G PO PACK
17.0000 g | PACK | Freq: Every day | ORAL | Status: DC
  Administered 2017-10-21: 17 g via ORAL
  Filled 2017-10-21 (×2): qty 1

## 2017-10-21 MED ORDER — IPRATROPIUM-ALBUTEROL 0.5-2.5 (3) MG/3ML IN SOLN: 3.0000 mL | Freq: Four times a day (QID) | RESPIRATORY_TRACT | Status: DC | PRN

## 2017-10-21 MED ORDER — SORBITOL 70 % SOLN
960.0000 mL | TOPICAL_OIL | Freq: Once | ORAL | Status: AC
  Administered 2017-10-21: 960 mL via RECTAL
  Filled 2017-10-21: qty 473

## 2017-10-21 MED ORDER — FAMOTIDINE 20 MG PO TABS
20.0000 mg | ORAL_TABLET | Freq: Every day | ORAL | Status: DC
  Filled 2017-10-21: qty 1

## 2017-10-21 MED ORDER — SENNOSIDES-DOCUSATE SODIUM 8.6-50 MG PO TABS: 1.0000 | ORAL_TABLET | Freq: Every morning | ORAL | Status: DC

## 2017-10-21 MED ORDER — SODIUM CHLORIDE 0.9 % IV SOLN
1.0000 g | INTRAVENOUS | Status: DC
  Administered 2017-10-21: 1 g via INTRAVENOUS
  Filled 2017-10-21 (×2): qty 1

## 2017-10-21 MED ORDER — SENNA 8.6 MG PO TABS
1.0000 | ORAL_TABLET | Freq: Every day | ORAL | Status: DC
  Administered 2017-10-21: 8.6 mg via ORAL
  Filled 2017-10-21 (×2): qty 1

## 2017-10-21 MED ORDER — DOCUSATE SODIUM 100 MG PO CAPS
100.0000 mg | ORAL_CAPSULE | Freq: Every day | ORAL | Status: DC
  Administered 2017-10-21: 100 mg via ORAL
  Filled 2017-10-21 (×3): qty 1

## 2017-10-21 NOTE — Progress Notes (Addendum)
Family Medicine Teaching Service Daily Progress Note Intern Pager: [phone]  Patient name: Belinda Werner Medical record number: [id number] Date of birth: Jan 23, 1935 Age: 83 y.o. Gender: female  Primary Care Provider: Angelica Chessman, MD Consultants: Gen surgery Code Status: DNR  Pt Overview and Major Events to Date:  9/10 - Admitted  Basha Insingais a 83 y.o.femalepresenting with shortness of breath, increase work of breathing, fever and found to have a RML and RLL pneumonia  on CXR. PMH is significant forCOPD, HFpEF, hypokalemia, hypertension, CAD s/p stent placement, PAD, and hyperlipidemia.  Acute-on-chronic hypoxic respiratory failure, likely 2/2 to COPD exacerbation in the setting of aRML/RLL PNA, stable:no longer meeting sepsis criteria, afebrile, CXR showing airspace disease at RML and lung base suspicious for HAP, and requiring oxygen above her baseline.  -Admit to tele, admittingattending Dr. Jennette Kettle -Continueduonebs scheduled q4  -Albuterol PRN q2  - d/c Vanv 9/11, add IV Cefepime per Pharm dosing -fluids dc'ed d/t new pitting edema -Trend lactic acid: 3.65 > 1 > 2.2 > 1.5 (9/10) -Follow upCBCandBMP -Blood cx - NGTD -Vitals per routine  -Wean oxygen as tolerated; goal 88-92% spO2 -Incentive spirometer  -PT eval -Daily weights  Acute kidney injury, unresolved:Cr 1.54 9/11, baseline around 0.8. Likely prerenal 2/2 decreased PO and fluid intake (albumin 2.8) reported by family, also in the setting of lasix and spironolactone BID therapy -fluids dc'ed d/t new edema, will encourage PO intake  -Monitor BMP  -Avoid nephrotoxic medications  Hypokalemia, resolved:K 2.8 on admit, corrected to 4.4 9/11. Replete with goal >4.0 however will need to be careful given patient current AKI. -fluids dc'ed -Monitor BMP  COPD: on 3L at home chronically, symbicort, combivent, and spiriva at home; on 5L up to 6L [place] then back to 4L on 9/11 likely due to pneumonia. Follows  with Dr. Su Monks (pulmo). Continues to smoke tobacco, given nicotine patch. -Duonebs q4 scheduled  -Albuterol q2 PRN  -Hold home symbicort, combivent, and Spiriva, resume when clinically appropriate -Continue prednisone 50 mg daily -Discuss smoking cessation as below  Obstipation, chronic, resolved:patient denies BM in 2 weeks, usually has BM once weekly, tenderness to palpation in LLQ with hypoactive BS on exam -KUB obtained - results below -Rectal exam performed - no concerning findings -SMOG Enema 9/10 - 3 BMs 9/11 -Ordered one more SMOG Enema 9/11 -Diet advanced to Full Liquid -mIVF dc'ed -Miralax and [place] scheduled  Peripheral Vascular disease, chronic, stable: stented L common and external iliac artery on 09/2014. Patient had 80% stenosis noted. Patient is followed by vascular. On plavix 75 mg daily. -Hold Plavix  Normocytic anemia of chronic disease, stable: Hgb 9.7 on admit, Baseline 9-10. Likely secondary to chronic disease burden. Ferritin 141 in 05/2017.  -Monitor CBC  Sick Sinus Syndrome, stable: with history of bradycardia s/p pacemaker placement and paroxysmal atrial tachycardia; HR in the 70's on 10/21/2017. Pt reports no issues with her pacemaker. EKG unchanged from prior. -Interrogate pacemaker  -Monitor HR -Continue diltiazem 240 mg daily, Flecainide 100 mg bid, Metoprolol succinate 50 mg daily  HFpEF: Echo 05/2017 with EF 65-70% and G2DD. Appears euvolemic on exam. Takes lasix 40mg  BID and spironolactone 25 mg daily at home for control.  -Hold home medications given AKI and fluid resuscitation; consideration for restarting during hospitalization   -Cont home Metoprolol XR 50mg  -Cont home Atorvastatin 80mg   -Hold Losartan 50mg d/t AKI  Hypertension:Normotensive on admit, 120's systolic. Takes Losartan, metoprolol 50, lasix, and spironolactone at home for control.  -Cont home metoprolol -Hold home losartan, lasix, spironolactone  in setting of  AKI  Hyperlipidemia:Last LDL 44 in 06/2012.  -Lipid panel shows low HDL at 36, otherwise wnl -Cont home atorvastatin80  CAD s/p CABG: In 2004, patent grafts noted during cardiac cath in 2017. No anginal symptoms at this time. Takes daily aspirin, metoprolol, plavix, statin, and imdur 30mg  BID.  -Hold Plavix -Cont home medications as above   Tobacco use: 1/2 PPD, since she was 22. Interested in cutting back.  -Nicotine patch 14 mcg  -Discuss smoking cessation per pharmacy in the am  FEN/GI:Heart healthy diet Prophylaxis:Home Plavix, Lovenox  Disposition:Admit to telemetry  Subjective:  Belinda Werner is seen this morning sitting upright in bed, she just got out of the shower. She states she has been up since 3 AM and had some confusion this morning. She knows that she was confused and is better oriented now. She states her breathing was bad this morning when she woke up but is breathing better now and the O2 was increased from 5L to 6L. She reports having a rattling cough that is unproductive and continues to have lower left quadrant and left-sided abdominal pain. She reports having 3 bowel movements last night. She reports feeling hungry and states she does not care for the hospital food thus far. Otherwise she denies headaches, fevers, chest pain, nausea, vomiting, constipation, myalgias and chills.   Current Facility-Administered Medications:  .  acetaminophen (TYLENOL) tablet 650 mg .  aspirin chewable tablet 81 mg .  atorvastatin (LIPITOR) tablet 80 mg .  diltiazem (CARDIZEM CD) 24 hr capsule 240 mg .  enoxaparin (LOVENOX) injection 30 mg .  famotidine (PEPCID) tablet 20 mg .  flecainide (TAMBOCOR) tablet 100 mg q12H .  ipratropium-albuterol (DUONEB) 0.5-2.5 (3) MG/3ML nebulizer solution 3 mL .  isosorbide mononitrate (IMDUR) 24 hr tablet 30 mg BID .  metoprolol succinate (TOPROL-XL) 24 hr tablet 50 mg .  nicotine patch 14mg  .  Miralax .  predniSONE (DELTASONE) tablet 50  mg .  senna tablet 8.6 mg .  vancomycin (VANCOCIN) 500 mg  Objective: Temp:  [98.4 F (36.9 C)] 98.4 F (36.9 C) (09/10 2242) Pulse Rate:  [69-75] 69 (09/10 2242) Resp:  [22] 22 (09/10 2242) BP: (120-151)/(50-72) 151/50 (09/10 2242) SpO2:  [92 %-98 %] 98 % (09/10 2242) Weight:  [60.4 kg] 60.4 kg (09/11 0346)  Physical Exam  Constitutional: She is oriented to person, place, and time. She appears well-developed and well-nourished. No distress.  HENT:  Head: Normocephalic and atraumatic.  Eyes: Conjunctivae and EOM are normal.  Neck: Normal range of motion.  Cardiovascular: Regular rhythm.  Distant heart sounds difficult to auscultate d/t lung sounds  Pulmonary/Chest: No stridor. She has wheezes (Throughout, R > L). She exhibits no tenderness.  Increased Work of Breathing  Abdominal: Soft. Bowel sounds are normal. She exhibits no distension and no mass. There is tenderness (in LLQ and left Flank; No R or L CVA tenderness).  Left flank and LLQ are tender to palpation, no rashes present, patient does not recall if she had the Shingles Vaccine  Musculoskeletal: Normal range of motion. She exhibits edema (1+ Pitting Edema in bilateral LE). She exhibits no deformity.  Lymphadenopathy:    She has no cervical adenopathy.  Neurological: She is alert and oriented to person, place, and time.  Skin: Skin is warm. Capillary refill takes less than 2 seconds.  Psychiatric: She has a normal mood and affect. Her behavior is normal. Judgment and thought content normal.   Laboratory: Recent Labs  Lab 10/29/2017  1820 10/20/17 0303 10/21/17 0255  WBC 11.0* 16.2* 11.2*  HGB 9.7* 8.7* 8.1*  HCT 31.8* 28.1* 26.9*  PLT 274 225 277   Recent Labs  Lab 10/18/2017 1820 10/20/17 0303 10/21/17 0255  NA 142 140 143  K 2.8* 3.5 4.4  CL 96* 96* 106  CO2 30 29 24   BUN 17 23 30*  CREATININE 1.54* 1.56* 1.54*  CALCIUM 8.7* 8.5* 7.9*  PROT 6.2*  --   --   BILITOT 1.7*  --   --   ALKPHOS 66  --   --    ALT 10  --   --   AST 22  --   --   GLUCOSE 143* 196* 132*    HDL: 34 L (9/10) Lactic Acid: 2.2 > 1.5 (9/10)-  Imaging/Diagnostic Tests: Dg Chest 2 View  Result Date: 11/02/2017 CLINICAL DATA:  Cough, short of breath EXAM: CHEST - 2 VIEW COMPARISON:  09/24/2017, 05/25/2017, CT chest 09/25/2017 FINDINGS: Post sternotomy changes. Left-sided pacing device as before. Tiny right pleural effusion with development of right basilar and middle lobe airspace disease. Streaky atelectasis at the left base. Cardiomegaly with vascular congestion. Aortic atherosclerosis. No pneumothorax. IMPRESSION: 1. Trace right pleural effusion with development of airspace disease at the right middle lobe and lung base suspicious for a pneumonia. 2. Streaky atelectasis left base. 3. Cardiomegaly with vascular congestion. Mild asymmetric interstitial opacity in the right thorax, could be due to asymmetric edema or diffuse interstitial inflammatory process. Electronically Signed   By: Jasmine Pang M.D.   On: 10/31/2017 18:58   Dg Abd 1 View  Result Date: 10/15/2017 CLINICAL DATA:  Constipation EXAM: ABDOMEN - 1 VIEW COMPARISON:  09/30/2015 FINDINGS: Interstitial and alveolar disease at the right base.  Cardiomegaly. Multiple loops of dilated small bowel, measuring up to 4.3 cm. Scattered colon gas is noted. Mild stool in the right colon. Aortoiliac stent. IMPRESSION: 1. Multiple dilated loops of small bowel, suggesting bowel obstruction 2. Interstitial and alveolar disease at the right lung base, possible pneumonia. Electronically Signed   By: Jasmine Pang M.D.   On: 10/11/2017 21:52     Dollene Cleveland, DO 10/21/2017, 7:49 AM PGY-1, [facility] FPTS Intern pager: [phone], text pages welcome

## 2017-10-21 NOTE — Progress Notes (Signed)
[facility]/Trauma Progress Note      Assessment/Plan RML/RLL Pneumonia  COPD/on Home O2 - 24/7/ongoing tobacco use CAD/Hx of CHF/CABG 2004 PVD/hx  RCE/Hx of left carotid stent/ left common iliac stent Hypertension Dyslipidemia  Chronic constipation, no BM x 3 weeks - vs SBO - no prior hx of SBO Hx of abdominal hysterectomy/appendectomy/cholecystectomy  FEN:  clears ID:  Aztreonam  09/09; vancomycin 9/9>> DVT: SCD's  Follow up:  TBD  Plan: having BM's. advance diet to fulls as tolerated. Added colace. Abd xray read pending. Encourage ambulation and breeze/boost.    LOS: 2 days    Subjective: CC: abdominal pain  Pain improved but still mild pain in the LLQ. No nausea or vomiting. Pt does not have an appetite. She wants to go home. She had multiple BM's since yesterday.   Objective: Vital signs in last 24 hours: Temp:  [98.4 F (36.9 C)] 98.4 F (36.9 C) (09/10 2242) Pulse Rate:  [69-75] 69 (09/10 2242) Resp:  [22] 22 (09/10 2242) BP: (120-151)/(50-72) 151/50 (09/10 2242) SpO2:  [92 %-98 %] 98 % (09/11 0751) Weight:  [60.4 kg] 60.4 kg (09/11 0346) Last BM Date: 10/20/17(Enema Given today)  Intake/Output from previous day: 09/10 0701 - 09/11 0700 In: 2525.8 [P.O.:360; I.V.:1585.3; IV Piggyback:580.5] Out: -  Intake/Output this shift: No intake/output data recorded.  PE: Gen:  Alert, NAD, pleasant, cooperative Pulm:  Rate and effort normal, [place] in place Abd: Soft, ND, hypoactive BS, mild TTP LLQ Skin: no rashes noted, warm and dry   Anti-infectives: Anti-infectives (From admission, onward)   Start     Dose/Rate Route Frequency Ordered Stop   10/20/17 2030  vancomycin (VANCOCIN) 500 mg in sodium chloride 0.9 % 100 mL IVPB     500 mg 100 mL/hr over 60 Minutes Intravenous Every 24 hours 10/30/2017 2026     11/08/2017 2030  vancomycin (VANCOCIN) 1,250 mg in sodium chloride 0.9 % 250 mL IVPB     1,250 mg 166.7 mL/hr over 90 Minutes Intravenous  Once  10/13/2017 2026 10/18/2017 2302   11/06/2017 2015  aztreonam (AZACTAM) 2 g in sodium chloride 0.9 % 100 mL IVPB     2 g 200 mL/hr over 30 Minutes Intravenous  Once 10/21/2017 2013 10/20/2017 2313      Lab Results:  Recent Labs    10/20/17 0303 10/21/17 0255  WBC 16.2* 11.2*  HGB 8.7* 8.1*  HCT 28.1* 26.9*  PLT 225 277   BMET Recent Labs    10/20/17 0303 10/21/17 0255  NA 140 143  K 3.5 4.4  CL 96* 106  CO2 29 24  GLUCOSE 196* 132*  BUN 23 30*  CREATININE 1.56* 1.54*  CALCIUM 8.5* 7.9*   PT/INR No results for input(s): LABPROT, INR in the last 72 hours. CMP     Component Value Date/Time   NA 143 10/21/2017 0255   K 4.4 10/21/2017 0255   CL 106 10/21/2017 0255   CO2 24 10/21/2017 0255   GLUCOSE 132 (H) 10/21/2017 0255   BUN 30 (H) 10/21/2017 0255   CREATININE 1.54 (H) 10/21/2017 0255   CALCIUM 7.9 (L) 10/21/2017 0255   PROT 6.2 (L) 10/31/2017 1820   ALBUMIN 2.8 (L) 10/16/2017 1820   AST 22 10/31/2017 1820   ALT 10 10/21/2017 1820   ALKPHOS 66 10/17/2017 1820   BILITOT 1.7 (H) 10/26/2017 1820   GFRNONAA 30 (L) 10/21/2017 0255   GFRAA 35 (L) 10/21/2017 0255   Lipase     Component Value  Date/Time   LIPASE 25 10-23-2017 1820    Studies/Results: Dg Chest 2 View  Result Date: 10/23/17 CLINICAL DATA:  Cough, short of breath EXAM: CHEST - 2 VIEW COMPARISON:  09/24/2017, 05/25/2017, CT chest 09/25/2017 FINDINGS: Post sternotomy changes. Left-sided pacing device as before. Tiny right pleural effusion with development of right basilar and middle lobe airspace disease. Streaky atelectasis at the left base. Cardiomegaly with vascular congestion. Aortic atherosclerosis. No pneumothorax. IMPRESSION: 1. Trace right pleural effusion with development of airspace disease at the right middle lobe and lung base suspicious for a pneumonia. 2. Streaky atelectasis left base. 3. Cardiomegaly with vascular congestion. Mild asymmetric interstitial opacity in the right thorax, could be due to  asymmetric edema or diffuse interstitial inflammatory process. Electronically Signed   By: Jasmine Pang M.D.   On: 2017-10-23 18:58   Dg Abd 1 View  Result Date: 10/23/17 CLINICAL DATA:  Constipation EXAM: ABDOMEN - 1 VIEW COMPARISON:  09/30/2015 FINDINGS: Interstitial and alveolar disease at the right base.  Cardiomegaly. Multiple loops of dilated small bowel, measuring up to 4.3 cm. Scattered colon gas is noted. Mild stool in the right colon. Aortoiliac stent. IMPRESSION: 1. Multiple dilated loops of small bowel, suggesting bowel obstruction 2. Interstitial and alveolar disease at the right lung base, possible pneumonia. Electronically Signed   By: Jasmine Pang M.D.   On: Oct 23, 2017 21:52      Jerre Simon , [facility] 10/21/2017, 9:03 AM  Pager: [phone] Mon-Wed, Friday 7:00am-4:30pm Thurs 7am-11:30am  Consults: [phone]

## 2017-10-21 NOTE — Progress Notes (Signed)
RT NOTES: Heard patient screaming "Help Me!". Entered room to find patient without oxygen standing by sink asking how this Clinical research associate got into her house. Explained to patient she was in the hospital, placed oxygen back on patient and got patient safely back in bed. Patient now seems oriented to place at this time, stated when she gets upset she "forgets". RN notified.

## 2017-10-21 NOTE — Progress Notes (Signed)
FPTS Interim Progress Note:   S: Paged by nursing for increased confusion and shortness of breath upon waking up around 0300. Her nurse notes she was satting low-mid 80's on 4L when she woke up, increased to 6L. Went to evaluate patient at bedside. She is states she felt like she was in someone else's room when she woke up for about 5 minutes, but now realizes this is her room and is no longer confused. Denies any auditory or visual hallucinations tonight. However, she notes having some visual hallucinations over the past few days, in the daytime at home, such as seeing a newspaper that is not there. She no longer feels short of breath as she did when she woke up.   O: Blood pressure (!) 151/50, pulse 69, temperature 98.4 F (36.9 C), temperature source Oral, resp. rate (!) 22, height 5\' 1"  (1.549 m), weight 60.4 kg, SpO2 98 %. Gen: alert, NAD, sitting up watching tv  HENT: NCAT, EOMI, pupils equal and reactive  Cardiac: RRR no m/g/r  Lungs: Clear with inspiration, coarse breath sounds/expiratory rhonchi more prominent on the R lung fields. Slightly tachypneic, minimal increased WOB able to speak in full sentences. Now on 5L [place].  Abdomen: Soft, non-tender, non-distended, hypoactive BS  MSK/Ext: able to move all extremities spontaneously, warm, dry, palpable distal pulses.  Psych: Alert and oriented to person, place, time, and situation. Appropriate affect. No delusions or hallucinations.   A/P: Pt now alert and oriented and stable on 5L [place] breathing relatively comfortable. Question whether this confusion is associated with hypoxia as she was noted to have desaturation to low-mid 80's when she woke up vs retaining CO2. This may also be further presentation of sundowning vs difficulty orienting especially right after waking up in a new environment in setting of likely underlying dementia, as family during initial evaluation state she is often confused at home and "doesn't remember much." Consideration  for further evaluation of mental status if continued episodes of confusion after potential reversible causes are managed.  -Cont oxygen therapy, keep saturation >88% spO2  -Pt would like to try a breathing treatment, provide duoneb tx  -Obtain VBG  -Continued plan per day team     Will continue to monitor closely and f/u VBG.   Allayne Stack, DO  PGY-1 Family Medicine

## 2017-10-21 NOTE — Progress Notes (Signed)
Pharmacy Antibiotic Note  Belinda Werner is a 83 y.o. female admitted on 11/03/2017 with HCAP/pneumonia.  Pharmacy has been consulted for Cefepime dosing.  Plan: Cefepime 1 gram IV q24h Monitor clinical progress, cultures/sensitivities, renal function, abx plan   Height: 5\' 1"  (154.9 cm) Weight: 133 lb 2.5 oz (60.4 kg) IBW/kg (Calculated) : 47.8  Temp (24hrs), Avg:98.4 F (36.9 C), Min:98.4 F (36.9 C), Max:98.4 F (36.9 C)  Recent Labs  Lab 11/07/2017 1820 11/03/2017 1827 11/04/2017 2246 10/24/2017 2328 10/20/17 0303 10/21/17 0255  WBC 11.0*  --   --   --  16.2* 11.2*  CREATININE 1.54*  --   --   --  1.56* 1.54*  LATICACIDVEN  --  3.65* 1.00 2.2* 1.5  --     Estimated Creatinine Clearance: 23.9 mL/min (A) (by C-G formula based on SCr of 1.54 mg/dL (H)).    Allergies  Allergen Reactions  . Adhesive [Tape] Other (See Comments)    PULLS OFF SKIN!!  . Avelox [Moxifloxacin Hcl In Nacl] Hives and Rash  . Penicillins Hives    Has patient had a PCN reaction causing immediate rash, facial/tongue/throat swelling, SOB or lightheadedness with hypotension: Yes Has patient had a PCN reaction causing severe rash involving mucus membranes or skin necrosis: Unk Has patient had a PCN reaction that required hospitalization: No; was in hosp already Has patient had a PCN reaction occurring within the last 10 years: Yes If all of the above answers are "NO", then may proceed with Cephalosporin use.     Antimicrobials this admission: 9/11 Cefepime 9/9 Vanc >> 9/11 9/9 Aztreonam  x1  Dose adjustments this admission:  Microbiology results: 9/9 BCx: ngtd 9/9 UCx: sent    Thank you for allowing [place] to participate in this patients care.   Signe Colt, PharmD Please utilize [employer] (under [facility]) for appropriate number for your unit pharmacist. 10/21/2017 11:28 AM

## 2017-10-21 NOTE — Progress Notes (Signed)
MD to RN:  Please encourage good sleep hygiene through the night and decrease disturbances while patient is sleeping. Aim for lights out and a cooler temperature in the room around 10pm and decrease disturbances until closer to 5am. Make sure each team mate introduces themselves to the patient to keep her oriented and decrease confusion.  Thank you!  Peggyann Shoals, DO [facility], PGY-1 10/21/2017 7:01 PM

## 2017-10-21 NOTE — Progress Notes (Signed)
RN responded to bed alarm. Pt trying to get out of bed and states "I'm trying to go to the kitchen to fix some tea and mice are trying to get in my room". Pt placed back in bed. Pt able to answer orientation questions appropriately. Pt informed that she was confused and pt agrees. Dr. Annia Friendly made aware and is at bedside at this time.

## 2017-10-21 NOTE — Evaluation (Signed)
Physical Therapy Evaluation Patient Details Name: Belinda Werner MRN: [id number] DOB: 10-Feb-1935 Today's Date: 10/21/2017   History of Present Illness   83 y.o. female presenting with shortness of breath, increase work of breathing, fever and found to have a pneumonia on CXR. Admitted for Acute on chronic hypoxic respiratory failure, likely 2/2 to COPD exacerbation in the setting of a RML pneumonia. PMH is significant for COPD, HFpEF, hypokalemia, hypertension, CAD s/p stent placement, PAD, hyperlipidemia Acute on chronic hypoxic respiratory failure, likely 2/2 to COPD exacerbation in the setting of a RML pneumonia  Clinical Impression  PTA pt independent with limited community ambulation, utilizing RW in morning when LE sore but does not use AD rest of the day, independent in IADLs. Pt currently limited in safe mobility by O2 desaturation (see General Comments), and decreased strength and stability. Pt requires min guard for transfers to RW and minA for ambulation of 50 feet with RW. PT recommends HHPT level rehab at discharge to regain PLOF. PT will continue to follow acutely.    Follow Up Recommendations Home health PT;Supervision - Intermittent    Equipment Recommendations  None recommended by PT    Recommendations for Other Services OT consult     Precautions / Restrictions Precautions Precautions: Fall Restrictions Weight Bearing Restrictions: No      Mobility  Bed Mobility               General bed mobility comments: seated EoB on entry  Transfers Overall transfer level: Needs assistance   Transfers: Sit to/from Stand Sit to Stand: Min guard         General transfer comment: min guard for safety, good power up and steadying in standing  Ambulation/Gait Ambulation/Gait assistance: Min assist Gait Distance (Feet): 50 Feet Assistive device: Rolling walker (2 wheeled) Gait Pattern/deviations: Step-through pattern;Decreased stride length;Shuffle;Trunk flexed Gait  velocity: slowed Gait velocity interpretation: <1.8 ft/sec, indicate of risk for recurrent falls General Gait Details: minA for steadying with RW, slow, shuffling steps, mildly unsteady gait, no overt LoB, required 1x standing rest break for 4/4 DoE        Balance Overall balance assessment: Needs assistance Sitting-balance support: Feet supported;No upper extremity supported Sitting balance-Leahy Scale: Good     Standing balance support: Single extremity supported;During functional activity Standing balance-Leahy Scale: Fair                               Pertinent Vitals/Pain Pain Assessment: 0-10 Pain Score: 2  Pain Location: L side Pain Descriptors / Indicators: Dull Pain Intervention(s): Limited activity within patient's tolerance;Monitored during session;Repositioned    Home Living Family/patient expects to be discharged to:: Private residence Living Arrangements: Other (Comment)(grandaughter ) Available Help at Discharge: Available PRN/intermittently Type of Home: House Home Access: Stairs to enter   [employer] of Steps: 1 threshold Home Layout: One level Home Equipment: Environmental consultant - 2 wheels;Bedside commode;Shower seat Additional Comments: Uses BSC over toilet seat    Prior Function Level of Independence: Independent         Comments: Uses RW first thing in morning due to BLE stiffness/soreness, but does not use majority of the day. Indep with community amb and ADLs        Extremity/Trunk Assessment   Upper Extremity Assessment Upper Extremity Assessment: Generalized weakness    Lower Extremity Assessment Lower Extremity Assessment: Generalized weakness(history of CAD, sensation WFL)    Cervical / Trunk Assessment Cervical / Trunk Assessment:  Kyphotic  Communication   Communication: No difficulties  Cognition Arousal/Alertness: Awake/alert Behavior During Therapy: WFL for tasks assessed/performed Overall Cognitive Status:  Within Functional Limits for tasks assessed                                        General Comments General comments (skin integrity, edema, etc.): Pt on 3L O2 via [place], SaO2 90%O2, increased to 4L O2 for ambulation, with ambulation SaO2 dropped to 87%O2, with standing rest break and vc for pursed lipped breathing SaO2 rebounded to 90%O2, with ambulation back to room SaO2 dropped to 85%O2, with sitting rebounded to 90%O2, left on 4L O2 and RN notified     Assessment/Plan    PT Assessment Patient needs continued PT services  PT Problem List Decreased strength;Decreased activity tolerance;Decreased balance;Decreased mobility;Cardiopulmonary status limiting activity       PT Treatment Interventions DME instruction;Gait training;Functional mobility training;Therapeutic activities;Therapeutic exercise;Balance training;Patient/family education    PT Goals (Current goals can be found in the Care Plan section)  Acute Rehab PT Goals Patient Stated Goal: have BM PT Goal Formulation: With patient Time For Goal Achievement: 11/04/17 Potential to Achieve Goals: Fair    Frequency Min 3X/week    AM-PAC PT "6 Clicks" Daily Activity  Outcome Measure Difficulty turning over in bed (including adjusting bedclothes, sheets and blankets)?: A Little Difficulty moving from lying on back to sitting on the side of the bed? : A Little Difficulty sitting down on and standing up from a chair with arms (e.g., wheelchair, bedside commode, etc,.)?: Unable Help needed moving to and from a bed to chair (including a wheelchair)?: A Little Help needed walking in hospital room?: A Little Help needed climbing 3-5 steps with a railing? : Total 6 Click Score: 14    End of Session Equipment Utilized During Treatment: Gait belt;Oxygen Activity Tolerance: Patient tolerated treatment well Patient left: in bed;with call bell/phone within reach Nurse Communication: Mobility status PT Visit Diagnosis:  Other abnormalities of gait and mobility (R26.89);Muscle weakness (generalized) (M62.81);Difficulty in walking, not elsewhere classified (R26.2);Unsteadiness on feet (R26.81)    Time: 1210-1230 PT Time Calculation (min) (ACUTE ONLY): 20 min   Charges:   PT Evaluation $PT Eval Moderate Complexity: 1 Mod          Mariany Mackintosh B. Beverely Risen PT, DPT Acute Rehabilitation Services Pager [phone] Office [phone]   Elon Alas Fleet 10/21/2017, 12:43 PM

## 2017-10-24 LAB — CULTURE, BLOOD (ROUTINE X 2)
CULTURE: NO GROWTH
Culture: NO GROWTH

## 2017-11-10 NOTE — Progress Notes (Signed)
Paged by nursing around 0108 stating patient passed away, last seen doing well around midnight. Went to evaluate patient and confirmed. Daughter was contacted and informed of passing. Family came to bedside, all questions and concerns were answered.   Leticia PennaSamantha Aariz Maish, DO  PGY-1 Family Medicine

## 2017-11-10 NOTE — Progress Notes (Addendum)
Upon checking on pt at 0108 RN noticed pt was deceased. Dr. Annia FriendlyBeard and Dr. Enzo Biiall notified. Pt last known well around midnight and was A&Ox4. Pt did not express any complaints or discomfort at that time. Dr. Annia FriendlyBeard and Dr. Enzo Biiall at bedside. Daughter contacted by Dr. Annia FriendlyBeard. Belongings such as clothing and cell phone sent home with pt daughter. Earrings and watch was still on pt, items bagged with name on it and sent with pt to morgue.

## 2017-11-10 NOTE — Death Summary Note (Signed)
[facility]  Patient name: Belinda Werner Medical record number: [id number] Date of birth: 11/20/1934 Age: 83 y.o. Gender: female Date of Admission: 10/27/2017  Date of Death: 10/12/2017 Admitting Physician: Nestor RampSara L Neal, MD  Primary Care Provider: Angelica ChessmanAguiar, Rafaela M, MD Consultants: Gen surgery  Indication for Hospitalization: Shortness of Breath associated with Pneumonia  Problem List:  Pneumonia COPD Chronic Constipation Sick Sinus Syndrome Hypokalemia Peripheral Vascular Disease Normocytic Anemia of Chronic Disease HFpEF Hypertension Hyperlipidemia CAD s/p CABG Tobacco Use Disorder  Disposition: Death  Brief Hospital Course:  Belinda SeltzerCarol Werner was admitted on 2017/07/11 due to acutely worsening shortness of breath in sepsis found to be due to Pneumonia. She was started on Vancomycin and Aztreonam. Abdominal imaging showed dilated bowel loops concerning for constipation, which was treated with SMOG enema. The patient's labs and overall condition continued to improve.   All of a sudden a nurse went to perform vitals checks on the patient at 1:00am on 9/12 and noticed she was deceased, having last been seen around midnight. Family was notified.    Significant Procedures: None  Significant Labs and Imaging:  Dg Chest 2 View  Result Date: 11/04/2017 CLINICAL DATA:  Cough, short of breath EXAM: CHEST - 2 VIEW COMPARISON:  09/24/2017, 05/25/2017, CT chest 09/25/2017 FINDINGS: Post sternotomy changes. Left-sided pacing device as before. Tiny right pleural effusion with development of right basilar and middle lobe airspace disease. Streaky atelectasis at the left base. Cardiomegaly with vascular congestion. Aortic atherosclerosis. No pneumothorax. IMPRESSION: 1. Trace right pleural effusion with development of airspace disease at the right middle lobe and lung base suspicious for a pneumonia. 2. Streaky atelectasis left base. 3. Cardiomegaly with  vascular congestion. Mild asymmetric interstitial opacity in the right thorax, could be due to asymmetric edema or diffuse interstitial inflammatory process. Electronically Signed   By: Jasmine PangKim  Fujinaga M.D.   On: 2017/07/11 18:58   Dg Abd 1 View  Result Date: 10/21/2017 CLINICAL DATA:  Constipation EXAM: ABDOMEN - 1 VIEW COMPARISON:  09/30/2015 FINDINGS: Interstitial and alveolar disease at the right base.  Cardiomegaly. Multiple loops of dilated small bowel, measuring up to 4.3 cm. Scattered colon gas is noted. Mild stool in the right colon. Aortoiliac stent. IMPRESSION: 1. Multiple dilated loops of small bowel, suggesting bowel obstruction 2. Interstitial and alveolar disease at the right lung base, possible pneumonia. Electronically Signed   By: Jasmine PangKim  Fujinaga M.D.   On: 2017/07/11 21:52   Dg Abd 2 Views  Result Date: 10/21/2017 CLINICAL DATA:  Small-bowel obstruction EXAM: ABDOMEN - 2 VIEW COMPARISON:  2017/07/11 FINDINGS: Upright film shows no evidence for intraperitoneal free air. Supine film shows diffuse gaseous small bowel distension with dilated small bowel loops measuring up to 3.7 cm diameter. Overall imaging features similar to prior study. IMPRESSION: No substantial interval change in diffuse gaseous small bowel dilatation. Electronically Signed   By: Kennith CenterEric  Mansell M.D.   On: 10/21/2017 10:09     Dollene ClevelandAnderson, Kaimen Peine C, DO 11/02/2017, 10:13 PM PGY-1, [facility]

## 2017-11-10 DEATH — deceased

## 2017-12-14 ENCOUNTER — Ambulatory Visit: Payer: Medicare Other | Admitting: Surgery

## 2017-12-14 ENCOUNTER — Encounter (HOSPITAL_COMMUNITY): Payer: Medicare Other

## 2019-06-11 IMAGING — CT CT ANGIO CHEST
3 of 7 series · 18 of 36 positions shown · IV contrast (APPLIED)
Comparison: Radiographs yesterday.  Chest CT 09/30/2015

CLINICAL DATA: PE suspected, high pretest prob. Cough and shortness
of breath.

EXAM:
CT ANGIOGRAPHY CHEST WITH CONTRAST
TECHNIQUE: Multidetector CT imaging of the chest was performed using the
standard protocol during bolus administration of intravenous
contrast. Multiplanar CT image reconstructions and MIPs were
obtained to evaluate the vascular anatomy.
CONTRAST:  59 cc [id number] IOPAMIDOL ([id number]) INJECTION 76%

[Series 6: thins · axial · 0.65mm/px · z∈[+1121,+1387]mm · 15 of 437 slices shown]
[im 28/437  lung]
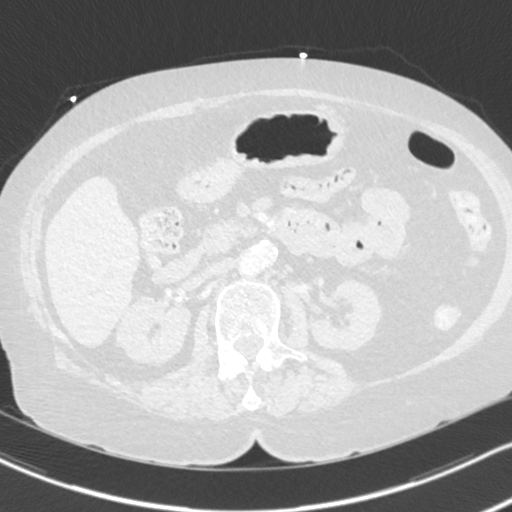
[im 55/437  mediastinal]
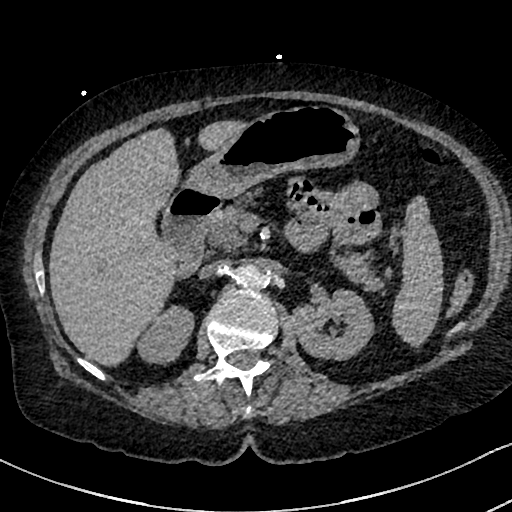
[im 82/437  lung]
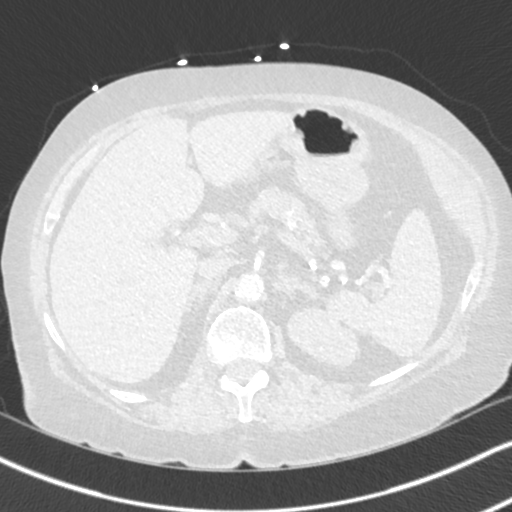
[im 110/437  mediastinal]
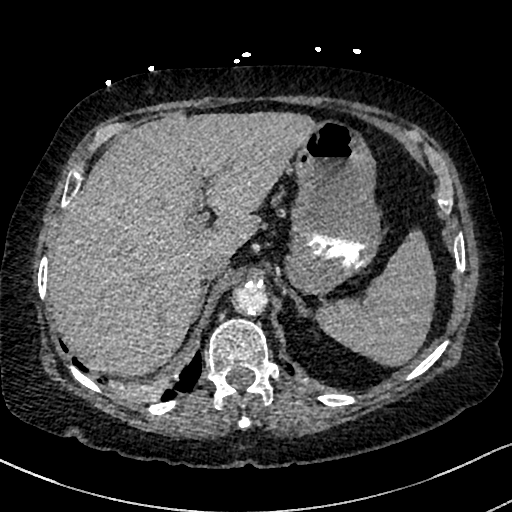
[im 137/437  lung]
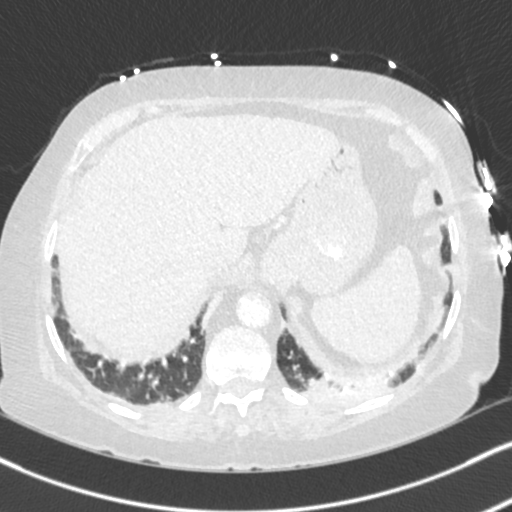
[im 164/437  mediastinal]
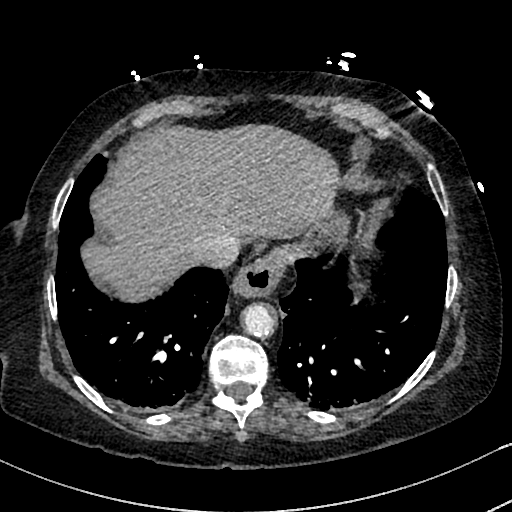
[im 191/437  lung]
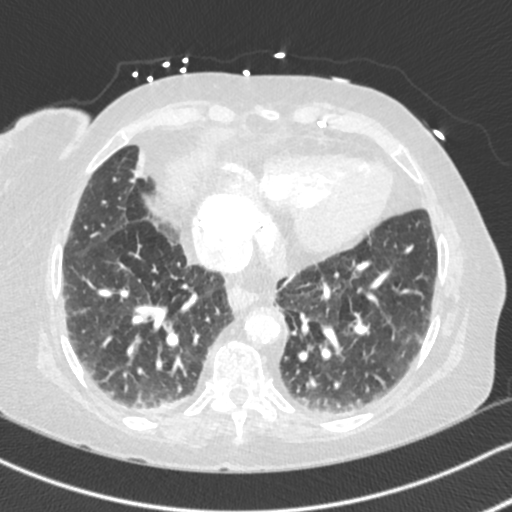
[im 219/437  mediastinal]
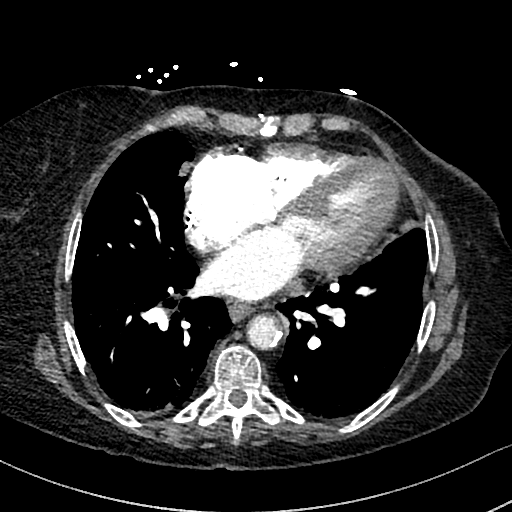
[im 246/437  lung]
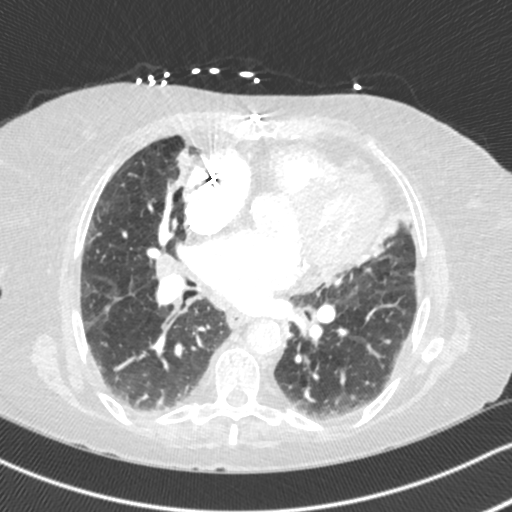
[im 273/437  mediastinal]
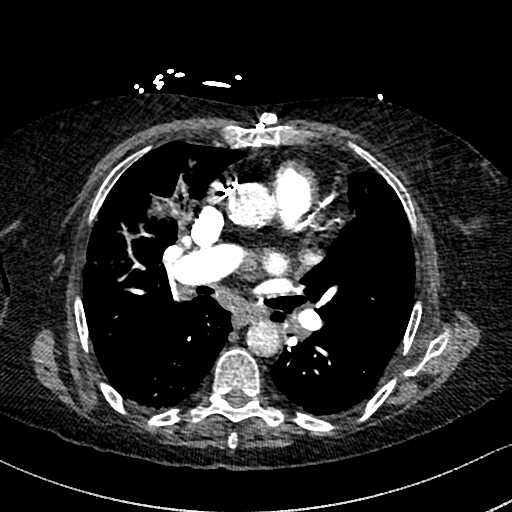
[im 300/437  lung]
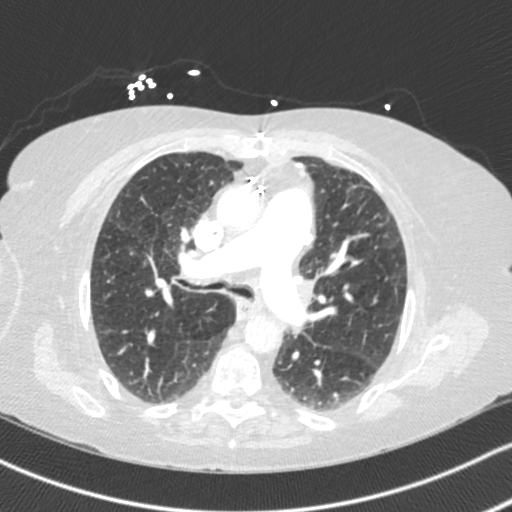
[im 328/437  mediastinal]
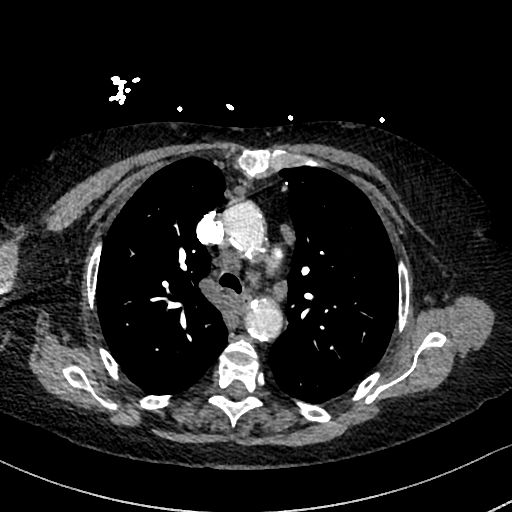
[im 355/437  lung]
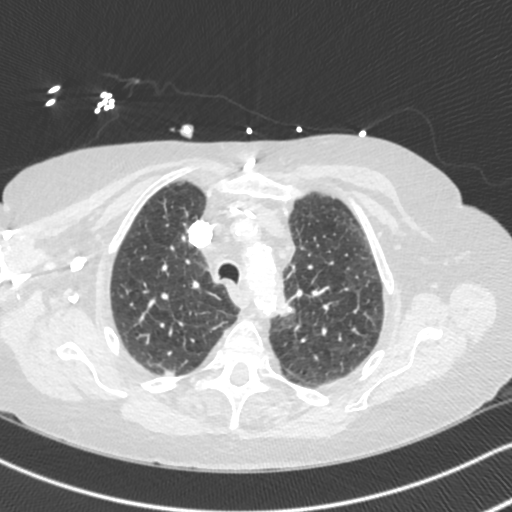
[im 382/437  mediastinal]
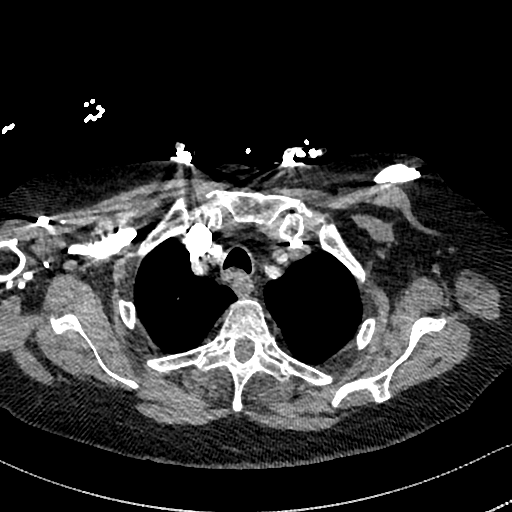
[im 409/437  lung]
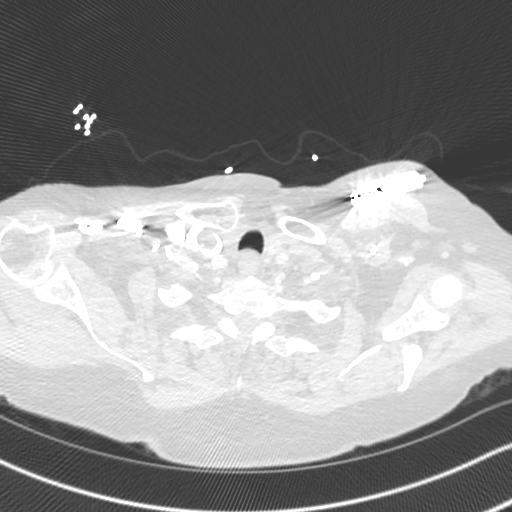

[Series 7: lung · axial · 0.65mm/px · z∈[+1229,+1289]mm · 2 of 119 slices shown]
[im 30/119  mediastinal]
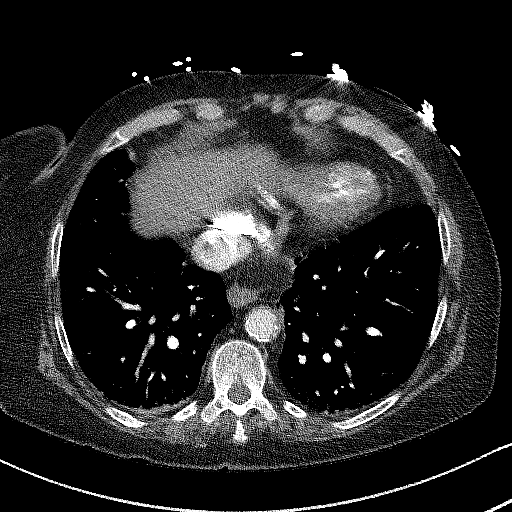
[im 60/119  mediastinal]
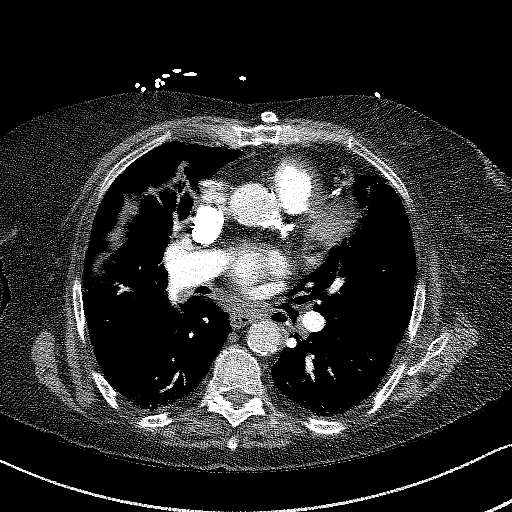

[Series 8: cor · coronal · 0.60mm/px · 1 of 121 slices shown]
[im 61/121  mediastinal]
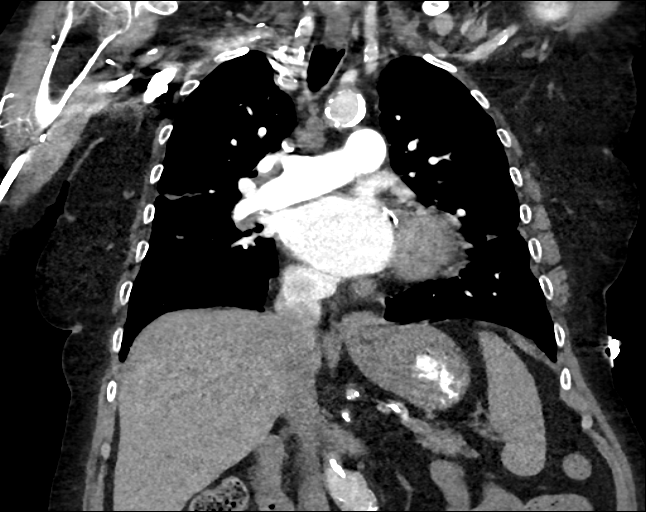

[18 of 36 positions shown; findings below may reference images not displayed]

FINDINGS: Cardiovascular: There are no filling defects within the pulmonary
arteries to suggest pulmonary embolus. Multi chamber cardiomegaly.
Aortic atherosclerosis without aneurysm or dissection. Stenosis at
the origin of the left subclavian artery, as seen on prior exam.
There are native coronary artery calcifications, post CABG.
Pacemaker with leads in the right atrium and ventricle.

Mediastinum/Nodes: Prominent right hilar node measures 12 mm short
axis. Lower paratracheal node slightly decreased from prior
measuring 18 mm, previously 21. Additional small mediastinal lymph
nodes are similar to prior exam. Small hiatal hernia. No thyroid
nodule.

Lungs/Pleura: Mild emphysema. Mild septal thickening consistent with
pulmonary edema. There is central bronchial thickening that may be
congestive or bronchitic. Linear opacities in the right upper and
middle lobes consistent with scarring. No significant pleural
effusion. Dependent atelectasis in both lungs.

Upper Abdomen: Stent in the upper abdominal aorta is partially
included but grossly stable from 8712 CT. No acute findings.

Musculoskeletal: Chronic T5 and T6 compression fractures. Post
median sternotomy. No acute osseous abnormality.

Review of the MIP images confirms the above findings.
IMPRESSION: 1. No pulmonary embolus.
2. Smooth septal thickening consistent with pulmonary edema,
bronchial thickening may be secondary to pulmonary edema or
bronchitic change.
3. Cardiomegaly. Aortic atherosclerosis. Post CABG with
calcification of the native coronary arteries.
4. Right middle and upper lobe scarring.

Aortic Atherosclerosis (JXGUE-PY9.9) and Emphysema (JXGUE-6AA.O).
